# Patient Record
Sex: Female | Born: 1935 | Race: White | Hispanic: No | Marital: Married | Smoking: Never smoker
Health system: Southern US, Community
[De-identification: ages and names within clinical notes are randomized; demographics above are authoritative.]

## PROBLEM LIST (undated history)

## (undated) DIAGNOSIS — T68XXXA Hypothermia, initial encounter: Secondary | ICD-10-CM

## (undated) DIAGNOSIS — I272 Pulmonary hypertension, unspecified: Secondary | ICD-10-CM

## (undated) DIAGNOSIS — R7881 Bacteremia: Secondary | ICD-10-CM

## (undated) DIAGNOSIS — I5030 Unspecified diastolic (congestive) heart failure: Secondary | ICD-10-CM

## (undated) DIAGNOSIS — I10 Essential (primary) hypertension: Secondary | ICD-10-CM

## (undated) DIAGNOSIS — K589 Irritable bowel syndrome without diarrhea: Secondary | ICD-10-CM

## (undated) DIAGNOSIS — I509 Heart failure, unspecified: Secondary | ICD-10-CM

## (undated) DIAGNOSIS — D649 Anemia, unspecified: Secondary | ICD-10-CM

## (undated) DIAGNOSIS — N189 Chronic kidney disease, unspecified: Secondary | ICD-10-CM

## (undated) DIAGNOSIS — E785 Hyperlipidemia, unspecified: Secondary | ICD-10-CM

## (undated) DIAGNOSIS — I6529 Occlusion and stenosis of unspecified carotid artery: Secondary | ICD-10-CM

## (undated) DIAGNOSIS — K5790 Diverticulosis of intestine, part unspecified, without perforation or abscess without bleeding: Secondary | ICD-10-CM

## (undated) DIAGNOSIS — B952 Enterococcus as the cause of diseases classified elsewhere: Secondary | ICD-10-CM

## (undated) DIAGNOSIS — I2510 Atherosclerotic heart disease of native coronary artery without angina pectoris: Secondary | ICD-10-CM

## (undated) DIAGNOSIS — I701 Atherosclerosis of renal artery: Secondary | ICD-10-CM

## (undated) DIAGNOSIS — I4891 Unspecified atrial fibrillation: Secondary | ICD-10-CM

## (undated) HISTORY — PX: CORONARY ANGIOPLASTY WITH STENT PLACEMENT: SHX49

## (undated) HISTORY — DX: Hyperlipidemia, unspecified: E78.5

## (undated) HISTORY — PX: ABDOMINAL HYSTERECTOMY: SHX81

## (undated) HISTORY — DX: Anemia, unspecified: D64.9

## (undated) HISTORY — DX: Occlusion and stenosis of unspecified carotid artery: I65.29

## (undated) HISTORY — DX: Unspecified atrial fibrillation: I48.91

## (undated) HISTORY — DX: Pulmonary hypertension, unspecified: I27.20

## (undated) HISTORY — DX: Bacteremia: R78.81

## (undated) HISTORY — DX: Hypothermia, initial encounter: T68.XXXA

## (undated) HISTORY — DX: Irritable bowel syndrome, unspecified: K58.9

## (undated) HISTORY — DX: Diverticulosis of intestine, part unspecified, without perforation or abscess without bleeding: K57.90

## (undated) HISTORY — DX: Enterococcus as the cause of diseases classified elsewhere: B95.2

## (undated) HISTORY — PX: OVARIAN CYST REMOVAL: SHX89

## (undated) HISTORY — DX: Essential (primary) hypertension: I10

## (undated) HISTORY — DX: Atherosclerosis of renal artery: I70.1

## (undated) HISTORY — DX: Chronic kidney disease, unspecified: N18.9

## (undated) HISTORY — DX: Unspecified diastolic (congestive) heart failure: I50.30

## (undated) HISTORY — DX: Atherosclerotic heart disease of native coronary artery without angina pectoris: I25.10

---

## 1993-08-26 HISTORY — PX: TOTAL HIP ARTHROPLASTY: SHX124

## 1996-08-26 HISTORY — PX: CORONARY ARTERY BYPASS GRAFT: SHX141

## 1999-09-05 ENCOUNTER — Inpatient Hospital Stay (HOSPITAL_COMMUNITY): Admission: EM | Admit: 1999-09-05 | Discharge: 1999-09-09 | Payer: Self-pay | Admitting: Cardiology

## 1999-09-06 ENCOUNTER — Encounter: Payer: Self-pay | Admitting: Cardiology

## 1999-09-11 ENCOUNTER — Inpatient Hospital Stay (HOSPITAL_COMMUNITY): Admission: EM | Admit: 1999-09-11 | Discharge: 1999-09-13 | Payer: Self-pay | Admitting: Cardiology

## 1999-09-12 ENCOUNTER — Encounter: Payer: Self-pay | Admitting: Cardiology

## 2000-04-10 ENCOUNTER — Encounter: Payer: Self-pay | Admitting: Cardiology

## 2000-04-10 ENCOUNTER — Ambulatory Visit (HOSPITAL_COMMUNITY): Admission: RE | Admit: 2000-04-10 | Discharge: 2000-04-11 | Payer: Self-pay | Admitting: Cardiology

## 2000-12-25 ENCOUNTER — Ambulatory Visit (HOSPITAL_COMMUNITY): Admission: RE | Admit: 2000-12-25 | Discharge: 2000-12-26 | Payer: Self-pay | Admitting: Cardiology

## 2003-02-10 ENCOUNTER — Inpatient Hospital Stay (HOSPITAL_COMMUNITY): Admission: EM | Admit: 2003-02-10 | Discharge: 2003-02-22 | Payer: Self-pay | Admitting: Internal Medicine

## 2003-02-11 ENCOUNTER — Encounter: Payer: Self-pay | Admitting: Cardiology

## 2003-02-16 ENCOUNTER — Encounter: Payer: Self-pay | Admitting: Internal Medicine

## 2003-05-03 ENCOUNTER — Ambulatory Visit (HOSPITAL_COMMUNITY): Admission: RE | Admit: 2003-05-03 | Discharge: 2003-05-03 | Payer: Self-pay | Admitting: Cardiology

## 2003-06-27 HISTORY — PX: RENAL ARTERY STENT: SHX2321

## 2003-06-28 ENCOUNTER — Ambulatory Visit (HOSPITAL_COMMUNITY): Admission: RE | Admit: 2003-06-28 | Discharge: 2003-06-29 | Payer: Self-pay | Admitting: Cardiology

## 2003-12-19 ENCOUNTER — Encounter: Admission: RE | Admit: 2003-12-19 | Discharge: 2003-12-19 | Payer: Self-pay | Admitting: Cardiology

## 2004-06-05 ENCOUNTER — Encounter: Payer: Self-pay | Admitting: Internal Medicine

## 2004-06-26 ENCOUNTER — Encounter: Payer: Self-pay | Admitting: Internal Medicine

## 2004-07-26 ENCOUNTER — Encounter: Payer: Self-pay | Admitting: Internal Medicine

## 2004-08-26 ENCOUNTER — Encounter: Payer: Self-pay | Admitting: Internal Medicine

## 2004-09-26 ENCOUNTER — Emergency Department: Payer: Self-pay | Admitting: Emergency Medicine

## 2004-10-01 ENCOUNTER — Ambulatory Visit: Payer: Self-pay | Admitting: Internal Medicine

## 2004-10-09 ENCOUNTER — Ambulatory Visit: Payer: Self-pay | Admitting: Nurse Practitioner

## 2004-10-22 ENCOUNTER — Ambulatory Visit: Payer: Self-pay | Admitting: Internal Medicine

## 2005-01-22 ENCOUNTER — Inpatient Hospital Stay: Payer: Self-pay | Admitting: Internal Medicine

## 2005-02-13 ENCOUNTER — Ambulatory Visit: Payer: Self-pay | Admitting: Gynecologic Oncology

## 2005-03-25 ENCOUNTER — Inpatient Hospital Stay: Payer: Self-pay | Admitting: Internal Medicine

## 2005-04-24 ENCOUNTER — Ambulatory Visit: Payer: Self-pay | Admitting: Gynecologic Oncology

## 2007-05-13 ENCOUNTER — Emergency Department: Payer: Self-pay | Admitting: Emergency Medicine

## 2007-06-11 ENCOUNTER — Emergency Department: Payer: Self-pay | Admitting: Emergency Medicine

## 2007-06-19 ENCOUNTER — Other Ambulatory Visit: Payer: Self-pay

## 2007-06-19 ENCOUNTER — Inpatient Hospital Stay: Payer: Self-pay | Admitting: Unknown Physician Specialty

## 2007-06-20 ENCOUNTER — Other Ambulatory Visit: Payer: Self-pay

## 2008-01-10 ENCOUNTER — Ambulatory Visit: Payer: Self-pay | Admitting: Internal Medicine

## 2008-01-10 ENCOUNTER — Emergency Department: Payer: Self-pay | Admitting: Emergency Medicine

## 2008-01-10 ENCOUNTER — Ambulatory Visit: Payer: Self-pay | Admitting: Pulmonary Disease

## 2008-01-10 ENCOUNTER — Inpatient Hospital Stay (HOSPITAL_COMMUNITY): Admission: AD | Admit: 2008-01-10 | Discharge: 2008-02-01 | Payer: Self-pay

## 2008-01-11 ENCOUNTER — Encounter (INDEPENDENT_AMBULATORY_CARE_PROVIDER_SITE_OTHER): Payer: Self-pay | Admitting: Internal Medicine

## 2008-01-19 ENCOUNTER — Encounter: Payer: Self-pay | Admitting: Cardiology

## 2008-01-25 ENCOUNTER — Ambulatory Visit: Payer: Self-pay | Admitting: *Deleted

## 2008-01-25 ENCOUNTER — Encounter: Payer: Self-pay | Admitting: Cardiology

## 2008-02-11 ENCOUNTER — Ambulatory Visit: Payer: Self-pay | Admitting: Internal Medicine

## 2008-02-11 LAB — CONVERTED CEMR LAB
BUN: 43 mg/dL — ABNORMAL HIGH (ref 6–23)
Calcium: 9.6 mg/dL (ref 8.4–10.5)
Creatinine, Ser: 1.39 mg/dL — ABNORMAL HIGH (ref 0.40–1.20)
Glucose, Bld: 110 mg/dL — ABNORMAL HIGH (ref 70–99)
HCT: 38.3 % (ref 36.0–46.0)
Hemoglobin: 11.7 g/dL — ABNORMAL LOW (ref 12.0–15.0)
MCHC: 30.5 g/dL (ref 30.0–36.0)
MCV: 94.1 fL (ref 78.0–100.0)
Pro B Natriuretic peptide (BNP): 505 pg/mL — ABNORMAL HIGH (ref 0.0–100.0)
RDW: 20.1 % — ABNORMAL HIGH (ref 11.5–15.5)
Sodium: 137 meq/L (ref 135–145)

## 2008-03-02 ENCOUNTER — Ambulatory Visit (HOSPITAL_COMMUNITY): Admission: RE | Admit: 2008-03-02 | Discharge: 2008-03-02 | Payer: Self-pay | Admitting: Nephrology

## 2008-05-05 ENCOUNTER — Ambulatory Visit: Payer: Self-pay | Admitting: Internal Medicine

## 2008-06-29 ENCOUNTER — Ambulatory Visit: Payer: Self-pay

## 2008-06-29 ENCOUNTER — Encounter: Payer: Self-pay | Admitting: Internal Medicine

## 2008-09-09 ENCOUNTER — Ambulatory Visit: Payer: Self-pay | Admitting: Internal Medicine

## 2008-09-21 ENCOUNTER — Ambulatory Visit: Payer: Self-pay

## 2008-11-14 ENCOUNTER — Ambulatory Visit: Payer: Self-pay | Admitting: Cardiovascular Disease

## 2008-12-28 ENCOUNTER — Ambulatory Visit: Payer: Self-pay | Admitting: Internal Medicine

## 2008-12-28 ENCOUNTER — Encounter: Payer: Self-pay | Admitting: Internal Medicine

## 2008-12-28 DIAGNOSIS — I6529 Occlusion and stenosis of unspecified carotid artery: Secondary | ICD-10-CM | POA: Insufficient documentation

## 2008-12-28 DIAGNOSIS — K5730 Diverticulosis of large intestine without perforation or abscess without bleeding: Secondary | ICD-10-CM | POA: Insufficient documentation

## 2008-12-28 DIAGNOSIS — I25810 Atherosclerosis of coronary artery bypass graft(s) without angina pectoris: Secondary | ICD-10-CM

## 2008-12-28 DIAGNOSIS — E785 Hyperlipidemia, unspecified: Secondary | ICD-10-CM

## 2008-12-28 DIAGNOSIS — K589 Irritable bowel syndrome without diarrhea: Secondary | ICD-10-CM | POA: Insufficient documentation

## 2008-12-28 DIAGNOSIS — I4891 Unspecified atrial fibrillation: Secondary | ICD-10-CM

## 2008-12-28 DIAGNOSIS — I5030 Unspecified diastolic (congestive) heart failure: Secondary | ICD-10-CM | POA: Insufficient documentation

## 2008-12-28 DIAGNOSIS — I10 Essential (primary) hypertension: Secondary | ICD-10-CM | POA: Insufficient documentation

## 2008-12-28 DIAGNOSIS — D649 Anemia, unspecified: Secondary | ICD-10-CM | POA: Insufficient documentation

## 2008-12-28 DIAGNOSIS — N189 Chronic kidney disease, unspecified: Secondary | ICD-10-CM | POA: Insufficient documentation

## 2009-01-17 ENCOUNTER — Ambulatory Visit: Payer: Self-pay

## 2009-05-18 ENCOUNTER — Encounter: Payer: Self-pay | Admitting: Internal Medicine

## 2009-06-02 IMAGING — CR LEFT WRIST - COMPLETE 3+ VIEW
1 series · 4 of 4 positions shown · non-contrast
Comparison: none

REASON FOR EXAM: Injury
COMMENTS:

[Series 1: view not recorded · 0.17mm/px · 4 of 4 slices shown]
[im 1/4]
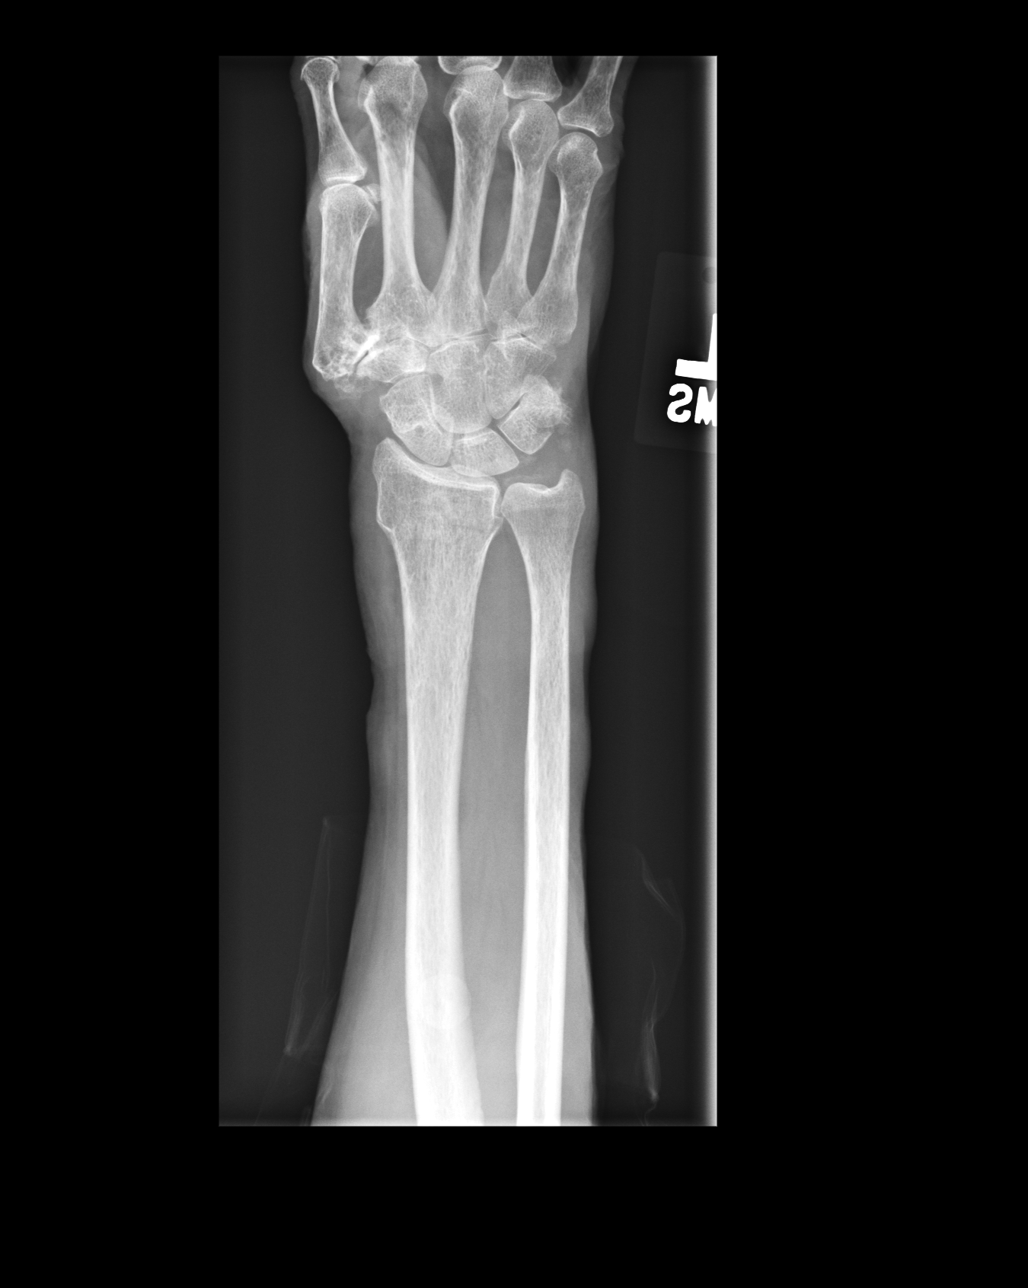
[im 2/4]
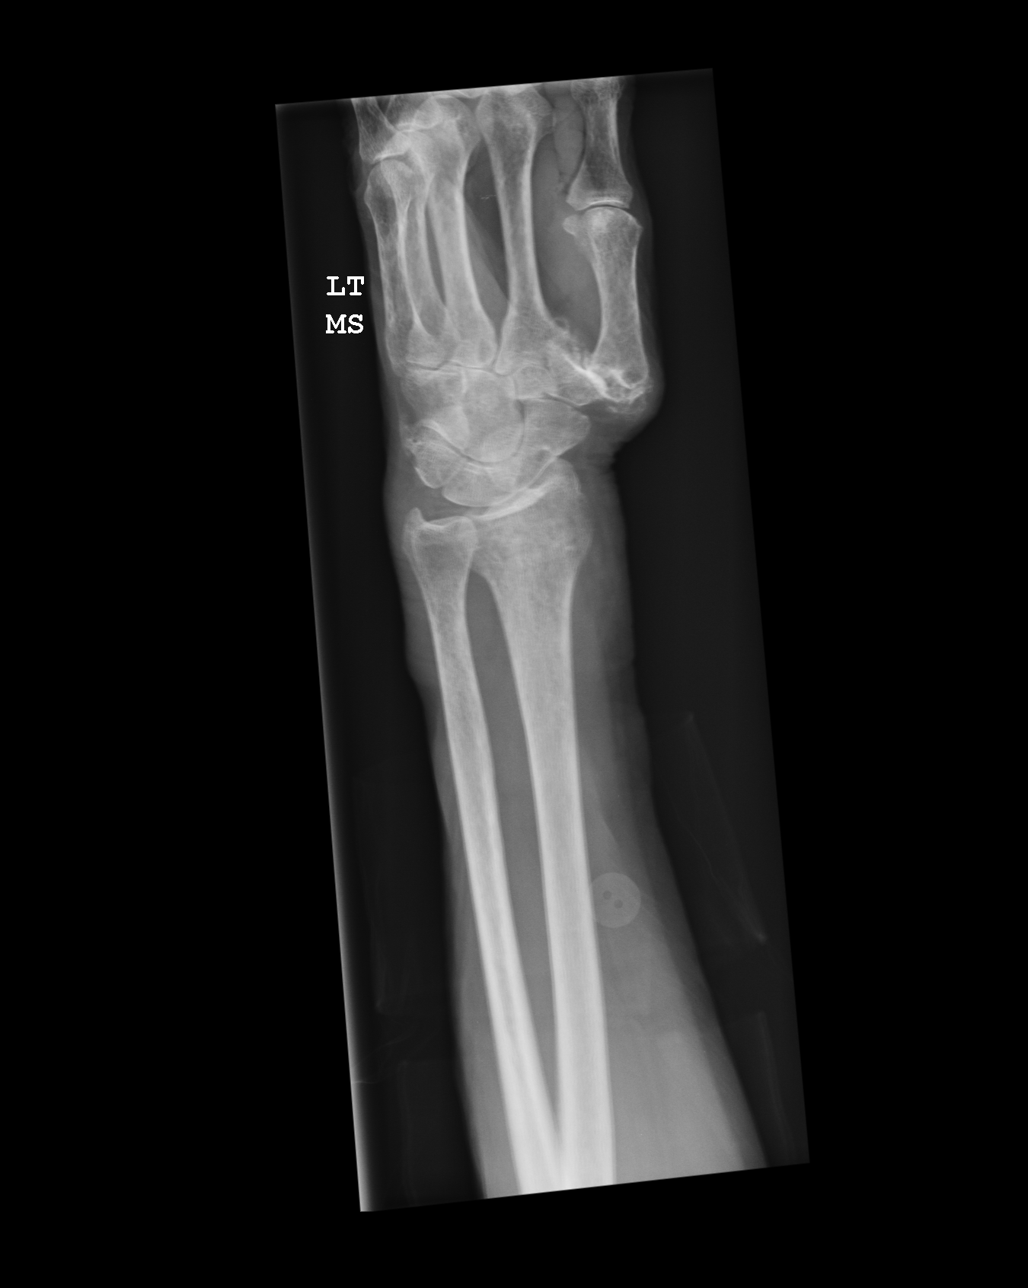
[im 3/4]
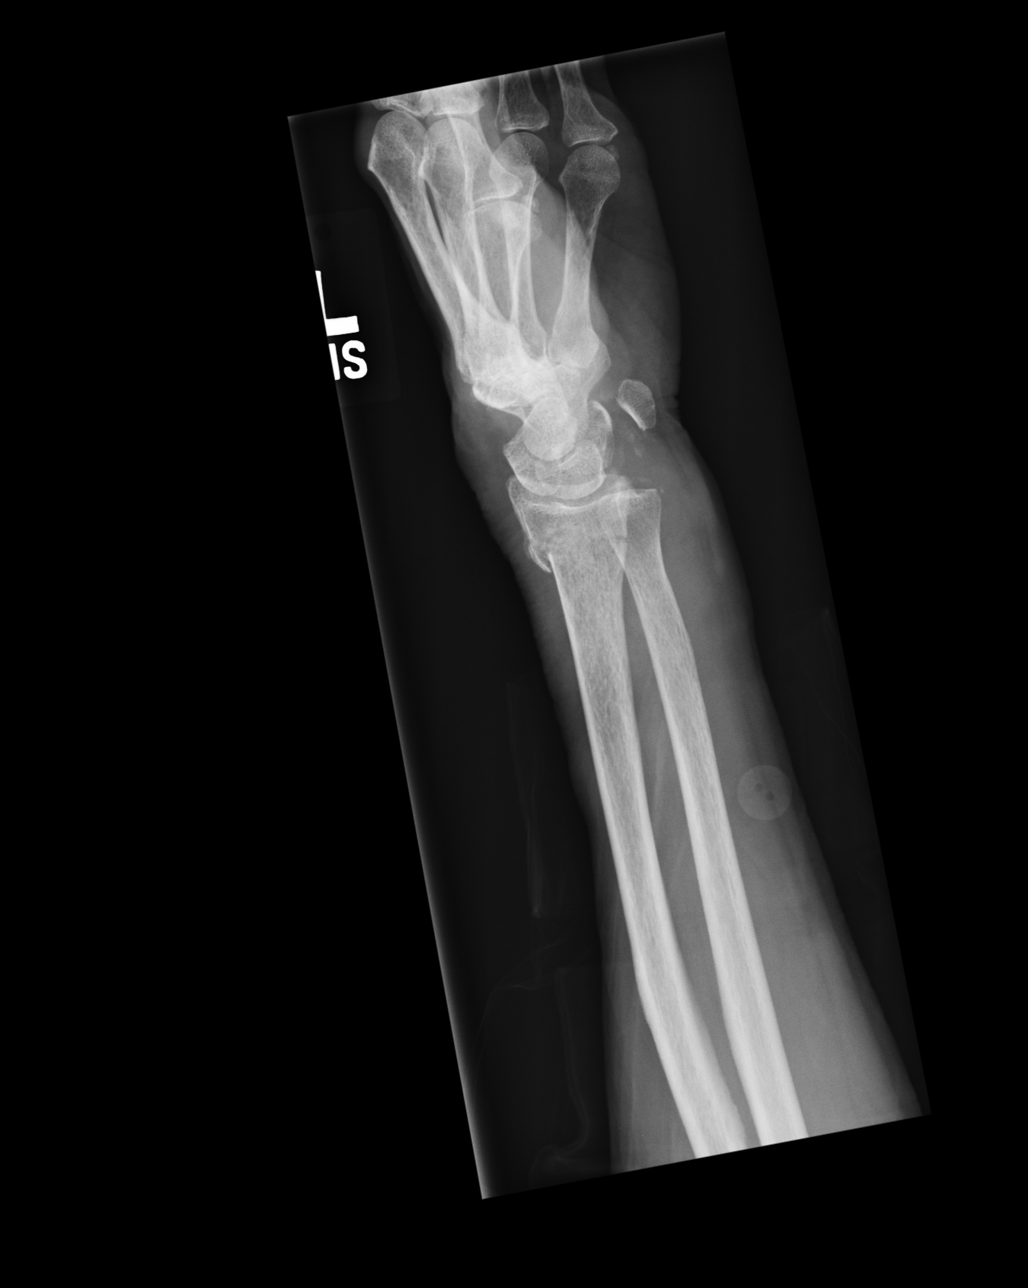
[im 4/4]
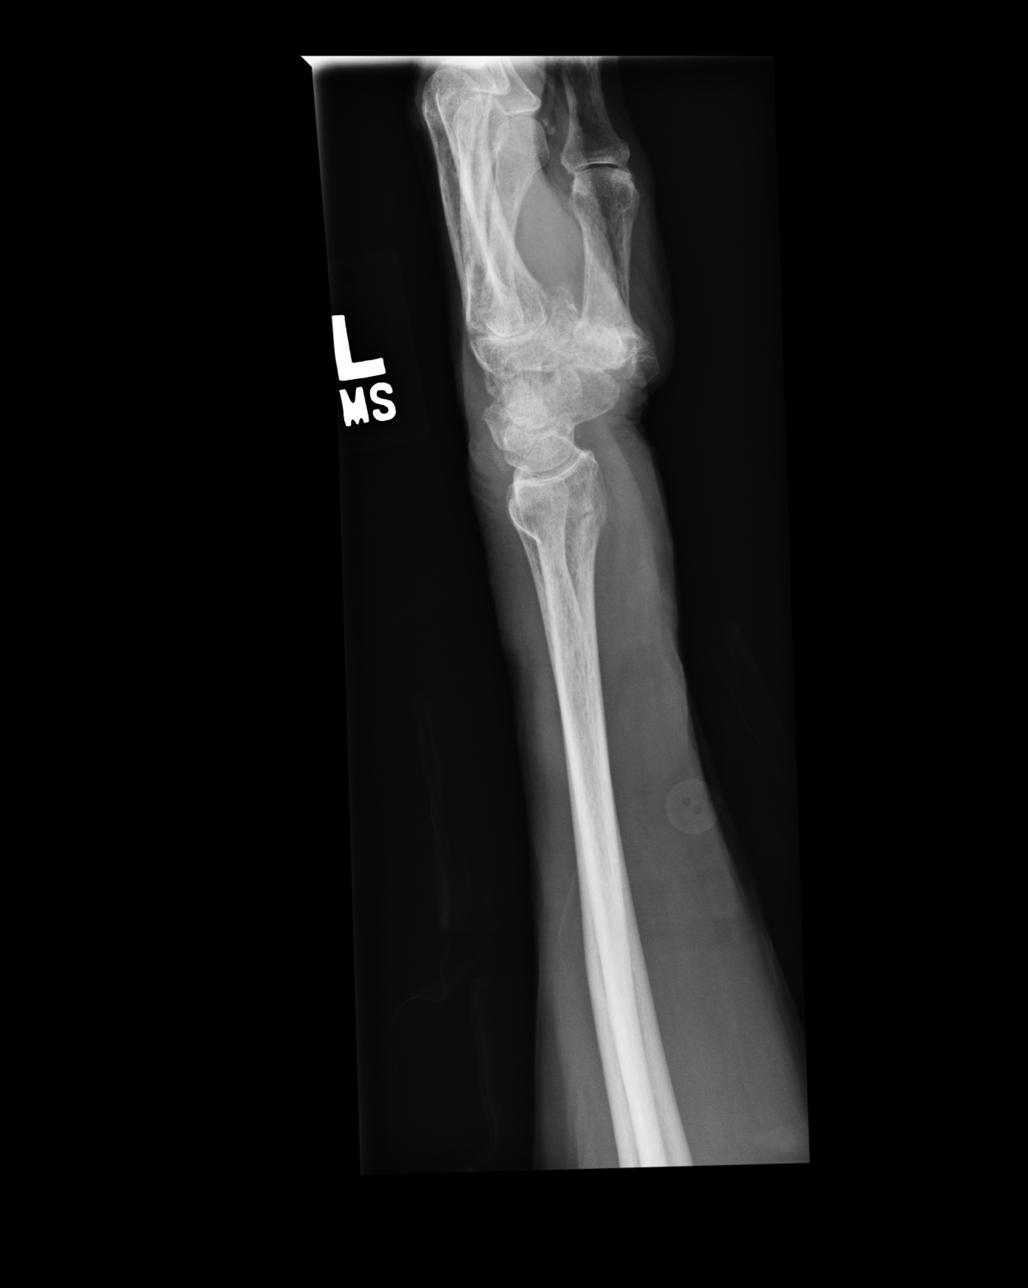

[4 of 4 positions shown; findings below may reference images not displayed]

PROCEDURE:     DXR - DXR WRIST LT COMP WITH OBLIQUES  - June 11, 2007  [DATE]

RESULT:     The patient has sustained an impacted but comminuted fracture of
the distal LEFT radial metaphysis. The distal ulna appears intact. There is
a small amount of angulation at the radial fracture site. There is
degenerative change of the carpometacarpal joints especially the first
joint. The carpal bones are grossly intact. There is diffuse osteopenia.
IMPRESSION: The patient has sustained a comminuted, impacted fracture
of the distal LEFT radial metaphysis.

## 2009-06-08 ENCOUNTER — Telehealth: Payer: Self-pay | Admitting: Internal Medicine

## 2009-06-08 ENCOUNTER — Ambulatory Visit: Payer: Self-pay

## 2009-06-08 ENCOUNTER — Encounter: Payer: Self-pay | Admitting: Cardiovascular Disease

## 2009-06-30 ENCOUNTER — Ambulatory Visit (HOSPITAL_COMMUNITY): Admission: RE | Admit: 2009-06-30 | Discharge: 2009-06-30 | Payer: Self-pay | Admitting: Nephrology

## 2009-07-03 ENCOUNTER — Telehealth: Payer: Self-pay | Admitting: Internal Medicine

## 2009-07-25 ENCOUNTER — Encounter: Payer: Self-pay | Admitting: Internal Medicine

## 2009-07-25 ENCOUNTER — Ambulatory Visit: Payer: Self-pay

## 2009-07-26 ENCOUNTER — Ambulatory Visit: Payer: Self-pay | Admitting: Internal Medicine

## 2009-07-31 ENCOUNTER — Ambulatory Visit: Payer: Self-pay | Admitting: Cardiovascular Disease

## 2009-09-04 ENCOUNTER — Ambulatory Visit (HOSPITAL_COMMUNITY): Admission: RE | Admit: 2009-09-04 | Discharge: 2009-09-04 | Payer: Self-pay | Admitting: Nephrology

## 2009-09-25 ENCOUNTER — Encounter: Payer: Self-pay | Admitting: Internal Medicine

## 2009-09-26 ENCOUNTER — Ambulatory Visit: Payer: BLUE CROSS/BLUE SHIELD | Admitting: Internal Medicine

## 2009-09-29 ENCOUNTER — Ambulatory Visit: Payer: BLUE CROSS/BLUE SHIELD | Admitting: Internal Medicine

## 2009-10-24 ENCOUNTER — Ambulatory Visit: Payer: BLUE CROSS/BLUE SHIELD | Admitting: Internal Medicine

## 2009-11-24 ENCOUNTER — Ambulatory Visit: Payer: BLUE CROSS/BLUE SHIELD | Admitting: Internal Medicine

## 2009-12-04 ENCOUNTER — Telehealth: Payer: Self-pay | Admitting: Internal Medicine

## 2009-12-18 ENCOUNTER — Encounter: Payer: Self-pay | Admitting: Cardiovascular Disease

## 2009-12-19 ENCOUNTER — Encounter: Payer: Self-pay | Admitting: Cardiovascular Disease

## 2009-12-19 ENCOUNTER — Telehealth: Payer: Self-pay | Admitting: Cardiovascular Disease

## 2009-12-19 ENCOUNTER — Ambulatory Visit: Payer: Self-pay

## 2009-12-19 DIAGNOSIS — R300 Dysuria: Secondary | ICD-10-CM | POA: Insufficient documentation

## 2009-12-20 LAB — CONVERTED CEMR LAB
Hemoglobin, Urine: NEGATIVE
Ketones, ur: NEGATIVE mg/dL
Nitrite: NEGATIVE
Urobilinogen, UA: 0.2 (ref 0.0–1.0)

## 2009-12-24 ENCOUNTER — Ambulatory Visit: Payer: BLUE CROSS/BLUE SHIELD | Admitting: Internal Medicine

## 2010-01-18 ENCOUNTER — Encounter: Payer: Self-pay | Admitting: Internal Medicine

## 2010-01-24 ENCOUNTER — Ambulatory Visit: Payer: BLUE CROSS/BLUE SHIELD | Admitting: Internal Medicine

## 2010-01-29 ENCOUNTER — Encounter: Payer: Self-pay | Admitting: Internal Medicine

## 2010-01-30 ENCOUNTER — Ambulatory Visit: Payer: Self-pay

## 2010-01-30 ENCOUNTER — Encounter: Payer: Self-pay | Admitting: Internal Medicine

## 2010-02-07 ENCOUNTER — Ambulatory Visit: Payer: Self-pay | Admitting: Internal Medicine

## 2010-02-23 ENCOUNTER — Ambulatory Visit: Payer: BLUE CROSS/BLUE SHIELD | Admitting: Internal Medicine

## 2010-03-14 ENCOUNTER — Ambulatory Visit: Payer: BLUE CROSS/BLUE SHIELD | Admitting: Internal Medicine

## 2010-03-26 ENCOUNTER — Ambulatory Visit: Payer: BLUE CROSS/BLUE SHIELD | Admitting: Internal Medicine

## 2010-05-18 ENCOUNTER — Observation Stay: Payer: BLUE CROSS/BLUE SHIELD | Admitting: Internal Medicine

## 2010-06-07 ENCOUNTER — Encounter: Payer: Self-pay | Admitting: Internal Medicine

## 2010-07-24 ENCOUNTER — Ambulatory Visit: Payer: BLUE CROSS/BLUE SHIELD | Admitting: Internal Medicine

## 2010-07-26 ENCOUNTER — Ambulatory Visit: Payer: BLUE CROSS/BLUE SHIELD | Admitting: Internal Medicine

## 2010-08-22 ENCOUNTER — Ambulatory Visit: Payer: Self-pay | Admitting: Internal Medicine

## 2010-09-24 ENCOUNTER — Ambulatory Visit: Payer: BLUE CROSS/BLUE SHIELD | Admitting: Internal Medicine

## 2010-09-25 NOTE — Assessment & Plan Note (Signed)
Summary: F6M/AMD   Visit Type:  6 mo f/u Referring Provider:  Lesle Reek Primary Provider:  Bethann Punches  CC:  no cardiac complaints today.  History of Present Illness: [name] is a delightful 77 year old white female  with multiple medical problems including coronary artery disease status post bypass surgery in 1998 with several subsequent coronary interventions. Most recently, she had angioplasty and stenting of the RCA with 2 bare-metal stents in June 2009.  She also has a history of chronic atrial fibrillation, diastolic heart failure, anemia, renal insufficiency, and renal artery stenosis status post right renal artery stenting.  In April I referred her to Dr. Clifton James to re-evalaute her renal artery stenosis after renal vascular u/s suggested progressive renal artery stenosis and some increasing atrophy of kidney. They discussed possible angiography but she was quite hesitant given previous problems. She discussed with Dr. Darrick Penna who also felt that she should defer angio given the risks and the fact that renal fx was stable. She had repeat renal u/s in may which showed stable > 60 bilat renal artery stenosis with small but stable kidneys. She sas Dr. Darrick Penna back who once again suggested avoiding renal arteriography.   She returns today for f/u. Doing well from cardiac standpoint. No CP or SOB. No swelling. Compliant will all meds. Gets around with walker, main complain is severe arthritis pain in hands, back and neck. Burises extensively with coumadin but no bleeding. No palpitations, syncope or falls.    Current Medications (verified): 1)  Lotensin 10 Mg Tabs (Benazepril Hcl) .Marland Kitchen.. 1 Tab Two Times A Day 2)  Aspirin 81 Mg Tbec (Aspirin) .... Take One Tablet By Mouth Daily 3)  Coumadin 5 Mg Tabs (Warfarin Sodium) .... Take As Directed By Cvrr 4)  Tramadol Hcl 50 Mg Tabs (Tramadol Hcl) .Marland Kitchen.. 1-2 Tab Three Times A Day 5)  Hydralazine Hcl 50 Mg Tabs (Hydralazine Hcl) .Marland Kitchen.. 1 Tab  Three Times A Day 6)  Toprol Xl 50 Mg Xr24h-Tab (Metoprolol Succinate) .... 1/2 Tab Every Am..1/2 Tab At Bedtime 7)  Vitamin D 1000 Unit Tabs (Cholecalciferol) .Marland Kitchen.. 1 By Mouth Once Daily 8)  Torsemide 20 Mg Tabs (Torsemide) .... 1/4 Tab Once Daily 9)  Nitroglycerin 0.4 Mg Subl (Nitroglycerin) .... One Tablet Under Tongue Every 5 Minutes As Needed For Chest Pain---May Repeat Times Three 10)  Xanax 0.25 Mg Tabs (Alprazolam) .Marland Kitchen.. 1-2 Tab in The Afternoon...2 Tabs At Bedtime. 11)  Centrum Silver  Tabs (Multiple Vitamins-Minerals) .Marland Kitchen.. 1 By Mouth Once Daily  Allergies: 1)  Codeine Phosphate (Codeine Phosphate) 2)  Penicillin V Potassium (Penicillin V Potassium)  Past History:  Past Medical History: CAD, ARTERY BYPASS GRAFT (ICD-414.04).Marland Kitchen status post CABG in 1988 and most recently with 2 bare-metal stents placed in the right coronary artery in June 2009.   ATRIAL FIBRILLATION (ICD-427.31) Carotis stenosis    --2004: 40-59 R 0-39% L    --June 2010 L 60-79% R 40-59% UNSPECIFIED DIASTOLIC HEART FAILURE (ICD-428.30) HYPERTENSION, UNSPECIFIED (ICD-401.9) HYPERLIPIDEMIA-MIXED (ICD-272.4) HYPERTENSION, PULMONARY (MODERATE TO SEVERE) (ICD-416.8) RENAL ARTERY STENOSIS (BILATERAL) (ICD-440.1).Marland Kitchenstatus post placement of a drug-eluting stent in the right renal artery in November 2004. s/p PTCA L    --renal u/s 10/10: >60% ostial stenosis B. both kidneys small. r decreasing in size    --renal u/s 5/11  > 60% B renal artery stenosis (Stable stenosis and kidney size)  RENAL INSUFFICIENCY, CHRONIC (ICD-585.9) DIVERTICULAR DISEASE (ICD-562.10) IRRITABLE BOWEL SYNDROME (ICD-564.1) ANEMIA (ICD-285.9) Endometriosis    Review of Systems  As per HPI and past medical history; otherwise all systems negative.   Vital Signs:  Patient profile:   77 year old female Height:      64 inches Weight:      127 pounds BMI:     21.88 Pulse rate:   63 / minute Pulse rhythm:   irregular BP sitting:   124 / 60   (left arm) Cuff size:   regular  Vitals Entered By: Danielle Rankin, CMA (February 07, 2010 12:12 PM)  Physical Exam  General:  Gen: Frail elderly female who walks with a walker. No acute distress. Pleasant.  HEENT: normal Neck: supple. no JVD. Carotids 2+ bilat; with bilat bruits L>R. No lymphadenopathy or thryomegaly appreciated. Cor: PMI nondisplaced. Irregular rate & rhythm. No rubs, gallops. 2/6 systolic murmur RSB. Lungs: clear bilaterally Abdomen: soft, nontender, nondistended. No hepatosplenomegaly. No bruits or masses. Good bowel sounds. Extremities: no cyanosis, clubbing, rash, edema Neuro: alert & orientedx3. Non-focal. Moves all extremities equally.    Impression & Recommendations:  Problem # 1:  CAD, ARTERY BYPASS GRAFT (ICD-414.04) Stable. No evidence of ischemia. Continue current regimen.  Problem # 2:  CAROTID STENOSIS (ICD-433.10) Asymptomatic. Will need f/u in 6 months.  Problem # 3:  ATRIAL FIBRILLATION (ICD-427.31) Stable. Continue coumadin.  Problem # 4:  HYPERTENSION, UNSPECIFIED (ICD-401.9) Blood pressure well controlled. Continue current regimen.  Problem # 5:  RENAL ARTERY STENOSIS (BILATERAL) (ICD-440.1) As long as kidney function and size are stable, I agree with watchful waiting approach. Appreciate input form Drs. Mcalhany and Deterding.   Other Orders: EKG w/ Interpretation (93000)  Patient Instructions: 1)  Your physician has requested that you have a carotid duplex. This test is an ultrasound of the carotid arteries in your neck. It looks at blood flow through these arteries that supply the brain with blood. Allow one hour for this exam. There are no restrictions or special instructions.  NEEDS IN 6 MONTHS. 2)  Your physician wants you to follow-up in:  6 MONTHS.  You will receive a reminder letter in the mail two months in advance. If you don't receive a letter, please call our office to schedule the follow-up appointment.  Prevention & Chronic  Care Immunizations   Influenza vaccine: Not documented    Tetanus booster: Not documented    Pneumococcal vaccine: Not documented    H. zoster vaccine: Not documented  Colorectal Screening   Hemoccult: Not documented    Colonoscopy: Not documented  Other Screening   Pap smear: Not documented    Mammogram: Not documented    DXA bone density scan: Not documented   Smoking status: never  (12/28/2008)  Lipids   Total Cholesterol: Not documented   LDL: Not documented   LDL Direct: Not documented   HDL: Not documented   Triglycerides: Not documented    SGOT (AST): Not documented   SGPT (ALT): Not documented   Alkaline phosphatase: Not documented   Total bilirubin: Not documented  Hypertension   Last Blood Pressure: 124 / 60  (02/07/2010)   Serum creatinine: 1.39  (02/11/2008)   Serum potassium 4.2  (02/11/2008)  Self-Management Support :    Hypertension self-management support: Not documented    Lipid self-management support: Not documented

## 2010-09-25 NOTE — Letter (Signed)
Summary: [facility] Patient Note   [facility] Patient Note   Imported By: Roderic Ovens 08/02/2010 14:16:31  _____________________________________________________________________  External Attachment:    Type:   Image     Comment:   External Document

## 2010-09-25 NOTE — Consult Note (Signed)
Summary: [facility]  [facility]   Imported By: Marylou Mccoy 12/01/2009 11:35:41  _____________________________________________________________________  External Attachment:    Type:   Image     Comment:   External Document

## 2010-09-25 NOTE — Letter (Signed)
Summary: [facility] Patient Note   [facility] Patient Note   Imported By: Roderic Ovens 02/20/2010 15:30:45  _____________________________________________________________________  External Attachment:    Type:   Image     Comment:   External Document

## 2010-09-25 NOTE — Miscellaneous (Signed)
Summary: Orders Update  Clinical Lists Changes  Orders: Added new Test order of Carotid Duplex (Carotid Duplex) - Signed 

## 2010-09-25 NOTE — Miscellaneous (Signed)
Summary: Orders Update  Clinical Lists Changes  Orders: Added new Test order of Renal Artery Duplex (Renal Artery Duplex) - Signed 

## 2010-09-25 NOTE — Progress Notes (Signed)
Summary: ROOT CANAL  Phone Note From Other Clinic Call back at [phone]   Caller: [name] Call For: [name] Summary of Call: IS PT OKAY TO HAVE A ROOT CANAL Initial call taken by: Harlon Flor,  December 04, 2009 4:39 PM  Follow-up for Phone Call        ok to have root canal.  pt states that she already had procedure done.   Follow-up by: Charlena Cross, RN, BSN,  December 06, 2009 12:56 PM

## 2010-09-25 NOTE — Consult Note (Signed)
Summary: [facility]   Imported By: Harlon Flor 02/02/2010 11:54:49  _____________________________________________________________________  External Attachment:    Type:   Image     Comment:   External Document

## 2010-09-25 NOTE — Progress Notes (Signed)
Summary: painful urination  Phone Note Call from Patient   Summary of Call: pt in to office for echo.  states that she is having frequency and burning with urination.  sample obtained.  will forward results to PCP.  Initial call taken by: Charlena Cross, RN, BSN,  December 19, 2009 3:55 PM  New Problems: DYSURIA (ICD-788.1)   New Problems: DYSURIA (ICD-788.1)

## 2010-10-15 ENCOUNTER — Encounter: Payer: Self-pay | Admitting: Internal Medicine

## 2010-10-18 ENCOUNTER — Encounter: Payer: Self-pay | Admitting: Internal Medicine

## 2010-10-18 ENCOUNTER — Ambulatory Visit: Payer: Self-pay | Admitting: Internal Medicine

## 2010-10-23 NOTE — Miscellaneous (Signed)
Summary: Orders Update  Clinical Lists Changes  Orders: Added new Test order of Carotid Duplex (Carotid Duplex) - Signed 

## 2010-11-13 ENCOUNTER — Encounter (INDEPENDENT_AMBULATORY_CARE_PROVIDER_SITE_OTHER): Payer: Medicare Other | Admitting: *Deleted

## 2010-11-13 ENCOUNTER — Encounter: Payer: Self-pay | Admitting: Cardiovascular Disease

## 2010-11-13 ENCOUNTER — Ambulatory Visit (INDEPENDENT_AMBULATORY_CARE_PROVIDER_SITE_OTHER): Payer: Medicare Other | Admitting: Cardiovascular Disease

## 2010-11-13 ENCOUNTER — Other Ambulatory Visit: Payer: Self-pay | Admitting: Internal Medicine

## 2010-11-13 VITALS — BP 100/68 | HR 61 | Ht 65.0 in | Wt 127.0 lb

## 2010-11-13 DIAGNOSIS — I739 Peripheral vascular disease, unspecified: Secondary | ICD-10-CM

## 2010-11-13 DIAGNOSIS — I6529 Occlusion and stenosis of unspecified carotid artery: Secondary | ICD-10-CM

## 2010-11-13 DIAGNOSIS — I701 Atherosclerosis of renal artery: Secondary | ICD-10-CM | POA: Insufficient documentation

## 2010-11-13 DIAGNOSIS — I10 Essential (primary) hypertension: Secondary | ICD-10-CM | POA: Insufficient documentation

## 2010-11-13 DIAGNOSIS — I25810 Atherosclerosis of coronary artery bypass graft(s) without angina pectoris: Secondary | ICD-10-CM

## 2010-11-13 DIAGNOSIS — E785 Hyperlipidemia, unspecified: Secondary | ICD-10-CM

## 2010-11-13 DIAGNOSIS — I4891 Unspecified atrial fibrillation: Secondary | ICD-10-CM

## 2010-11-13 DIAGNOSIS — I2510 Atherosclerotic heart disease of native coronary artery without angina pectoris: Secondary | ICD-10-CM

## 2010-11-13 NOTE — Patient Instructions (Signed)
Return this week for Lipid/Lft. Continue current medications. Follow up in 6 months, we will call to schedule appt.

## 2010-11-13 NOTE — Assessment & Plan Note (Signed)
Currently with no symptoms of angina. Continue medical management.

## 2010-11-13 NOTE — Assessment & Plan Note (Signed)
Currently on warfarin. Excellent: Her medication regimen.

## 2010-11-13 NOTE — Assessment & Plan Note (Signed)
We'll continue on her current blood pressure medications.

## 2010-11-13 NOTE — Progress Notes (Signed)
   Patient ID: [name], female    DOB: 11-18-1922, 77 y.o.   MRN: [id number]  HPI [name] is a delightful 77 year old white female  with multiple medical problems including coronary artery disease status post bypass surgery in 1998 with several subsequent coronary interventions. Most recently, she had angioplasty and stenting of the RCA with 2 bare-metal stents in June 2009.  She also has a history of chronic atrial fibrillation, diastolic heart failure, anemia, renal insufficiency, and renal artery stenosis status post right renal artery stenting.she presents for routine followup.  She reports that she has been doing well overall. She denies any new complaints. No significant shortness of breath, edema, lightheadedness. She is able to ambulate though her gait is unsteady and she is weak.  Gets around with walker, main complain is severe arthritis pain in hands, back and neck. Burises extensively with coumadin but no bleeding. No palpitations, syncope or falls.   She has renal artery stenosis that has been progressive with some increasing atrophy of kidney.  she was quite hesitant to have any intervention given previous problems. She discussed with Dr. Darrick Penna who also felt that she should defer angio given the risks and the fact that renal fx was stable. She had repeat renal u/s in may which showed stable > 60 bilat renal artery stenosis with small but stable kidneys. She sas Dr. Darrick Penna back who once again suggested avoiding renal arteriography.   Review of Systems  HENT: Negative.   Eyes: Negative.   Respiratory: Negative.   Cardiovascular: Negative.   Gastrointestinal: Negative.   Musculoskeletal: Positive for back pain, joint swelling, arthralgias and gait problem.  Skin: Negative.   Neurological: Positive for weakness. Negative for dizziness, light-headedness and numbness.  Hematological: Negative.   Psychiatric/Behavioral: Negative.       Physical Exam    Constitutional: She is oriented to person, place, and time.       Thin woman. Frail  HENT:  Head: Normocephalic.  Nose: Nose normal.  Mouth/Throat: Oropharynx is clear and moist.  Eyes: Conjunctivae are normal. Pupils are equal, round, and reactive to light.  Neck: Normal range of motion. Neck supple. No JVD present.  Cardiovascular: Normal rate, regular rhythm and intact distal pulses.  Exam reveals no gallop and no friction rub.   Murmur heard. Pulmonary/Chest: Effort normal and breath sounds normal. No respiratory distress. She has no wheezes. She has no rales. She exhibits no tenderness.  Abdominal: Soft. Bowel sounds are normal. She exhibits no distension. There is no tenderness.  Musculoskeletal: Normal range of motion. She exhibits no edema and no tenderness.  Lymphadenopathy:    She has no cervical adenopathy.  Neurological: She is alert and oriented to person, place, and time. Coordination normal.  Skin: Skin is warm and dry. No rash noted. No erythema.  Psychiatric: She has a normal mood and affect. Her behavior is normal. Judgment and thought content normal.

## 2010-11-13 NOTE — Assessment & Plan Note (Signed)
Previous history of renal artery stenosis. Will recommend ultrasound on an annual basis. Could intervene if needed for severe disease.

## 2010-11-13 NOTE — Assessment & Plan Note (Signed)
Recent carotid arterial ultrasound dated March 2012 shows 40-59% right carotid disease, 60-79% left carotid disease. Stable from previous ultrasound.

## 2010-11-13 NOTE — Progress Notes (Signed)
   Patient ID: [name], female    DOB: [birth year], 77 y.o.   MRN: [id number]  HPI    Review of Systems    Physical Exam   EKG on her visit November 13, 2010 shows atrial fibrillation with rate of 61 beats per minute with no significant ST or T wave changes.

## 2010-11-15 ENCOUNTER — Other Ambulatory Visit: Payer: BLUE CROSS/BLUE SHIELD | Admitting: *Deleted

## 2010-11-16 ENCOUNTER — Ambulatory Visit: Payer: BLUE CROSS/BLUE SHIELD | Admitting: Internal Medicine

## 2010-11-16 ENCOUNTER — Other Ambulatory Visit (INDEPENDENT_AMBULATORY_CARE_PROVIDER_SITE_OTHER): Payer: Medicare Other | Admitting: *Deleted

## 2010-11-16 DIAGNOSIS — E785 Hyperlipidemia, unspecified: Secondary | ICD-10-CM

## 2010-11-25 ENCOUNTER — Ambulatory Visit: Payer: BLUE CROSS/BLUE SHIELD | Admitting: Internal Medicine

## 2010-11-29 ENCOUNTER — Encounter: Payer: BLUE CROSS/BLUE SHIELD | Admitting: Internal Medicine

## 2010-12-06 ENCOUNTER — Other Ambulatory Visit: Payer: Self-pay | Admitting: Internal Medicine

## 2010-12-06 DIAGNOSIS — I6529 Occlusion and stenosis of unspecified carotid artery: Secondary | ICD-10-CM

## 2011-01-08 NOTE — Consult Note (Signed)
NAMEADREENA, [name]           ACCOUNT NO.:  [id number]   MEDICAL RECORD NO.:  [id number]          PATIENT TYPE:  INP   LOCATION:  2111                         FACILITY:  [facility]   PHYSICIAN:  Bevelyn Buckles. Bensimhon, MDDATE OF BIRTH:  12/22/1922   DATE OF CONSULTATION:  01/11/2008  DATE OF DISCHARGE:                                 CONSULTATION   PRIMARY CARE PHYSICIAN:  Bethann Punches, MD, [place].   CARDIOLOGIST:  Arnoldo Hooker, MD, [place].   REQUESTING PHYSICIAN:  Oley Balm. Simonds, MD.   REASON FOR CONSULTATION:  Non-ST-elevation myocardial infarction and  acute on chronic renal failure.   HISTORY OF PRESENT ILLNESS:  [name] is a very complicated 77-year-  old woman with multiple medical problems including coronary artery  disease status post bypass surgery in 1988 with subsequent percutaneous  interventions on the LAD in 2001 as well as PCI on the saphenous vein  graft to the OM in 2004.  She also has a history of congestive heart  failure secondary to ischemic cardiomyopathy with ejection fraction of  35-45% in 2004, chronic atrial fibrillation, renal artery stenosis  status post multiple percutaneous interventions, pulmonary hypertension,  and chronic renal insufficiency with creatinine about 1.5 in 2004.   She was previously followed by Dr. Samule Ohm in our office but more  recently followed by Dr. Gwen Pounds.   In reviewing her cardiac records, she apparently has quite a bit of  problems with right renal artery stenosis.  She underwent several  angioplasties of this but was complicated by in-stent restenosis.  She  finally underwent a drug-eluting stent to the right renal artery in  November 2004.  Followup CT angiogram in 2005 showed a patent renal  artery.  She has also had extensive problems with coronary artery  disease.  Most recent cardiac catheterization in June 2004 showed normal  left main. LAD was totaled in the mid section. Previous stents were  occluded.  The left circumflex had mild disease in the main circumflex  with totally occluded OM-2.  Right coronary artery at 40% lesion in the  mid section and 70% lesion in the ostium of PA.  The LIMA to the LAD,  which was previously atretic, was no longer atretic and had fairly  diffuse disease, however, and this was not amenable to angioplasty. The  saphenous vein graft to the OM-2 had a 90% lesion in it and was stented  with TAXUS drug-eluting stent.   Since that time, she has been in apparently in typical but frail state  of health.  She says about a month ago she noticed increasing shortness  of breath and occasional lower extremity edema with one episode of  fairly severe edema.  Last week, however, she began to develop chest  pain.  She was taken to [facility] with chest pain  and confusion on May 17, and she was found to be in acute renal failure  with a creatinine of 6.5, potassium of 6.7, with a wide complex  bradycardia secondary to her hyperkalemia.  She was treated with a  Kayexalate, bicarbonate, and dopamine for associated hypotension.  Her  renal failure has improved.  She is making good urine; however, that is  very much dependent on maintaining her blood pressure with dopamine.  She denies any further chest pain.   Followup labs have shown elevated cardiac markers with initial CK of  542, an MB of 53, a troponin of 13.  Followup markers showed a CK of  590, MB of 49, and troponin of 23.   REVIEW OF SYSTEMS:  She has a significant weakness and arthritis pain.  She has dyspnea on just mild exertion as well as lower extremity edema.  She also has arthritis pain.  Denies any fevers, chills.  No cough, no  bleeding.  No focal neurologic symptoms.  No rash.  Remainder of Review  of Systems is negative except for HPI and Problem List.   PROBLEM LIST:  1. Coronary artery disease.      a.     Status post bypass surgery in 1988 by Dr. Andrey Campanile.      b.      Status post percutaneous intervention of the left anterior       descending in 2001.      c.     Status post percutaneous intervention of the saphenous vein       graft to the obtuse marginal with a TAXUS drug-eluting stent in       2004.      d.     Last cardiac catheterization in June 2004 as per History of       Present Illness.  2. History of congestive heart failure secondary to ischemic      cardiomyopathy, ejection fraction 35-45% by echocardiogram in June      2004.  There was some suggestion of constrictive pericarditis at      that time.  3. Chronic atrial fibrillation.  4. Right renal artery stenosis status post multiple percutaneous      interventions complicated by in-stent restenosis.      a.     Most recent stenting in November 2004 with a drug-eluting       stent.  b . Follow-up CT angiogram in 2005 showed patent renal artery stent.  1. Pulmonary hypertension.  2. Chronic renal insufficiency.  Current baseline unknown. Creatinine      was 1.5 in 2004.  3. Peripheral vascular disease.  4. Hypertension.  5. Hypothyroidism.  6. Osteoarthritis.   CURRENT MEDICATIONS:  1. Dopamine.  2. Aspirin 81 mg a day.  3. Protonix 40 a day.  4. Synthroid.   ALLERGIES:  PENICILLIN.  There was initially some question of a Plavix  allergy, but that has been refuted.   SOCIAL HISTORY:  She lives in  [place] with her husband.  She is  retired.  No tobacco or alcohol use.   FAMILY HISTORY:  Father died  at age 49 from myocardial infarctions.  She has multiple siblings with coronary artery disease.   PHYSICAL EXAMINATION:  GENERAL:  She is an elderly, frail-appearing  woman. She is frequently moaning but in no acute distress.  She is alert  and oriented and is able to provide an adequate history.  VITAL SIGNS:  Blood pressure is 109/47 with a mean of 70.  She is on 5  mcg/kg per minute of dopamine.  Her heart rate is 80.  She is irregular.  She is saturating 95% on 2  liters nasal cannula.  CVP is 10.  She is  afebrile at 98.2.  HEENT/NECK:  Notable for mildly elevated neck veins. She has a triple-  lumen catheter with some bruising in the left internal jugular. She has  bilateral carotid bruits.  Upstrokes are 1+ bilaterally.  CARDIAC:  PMI is not displaced.  She is irregular with distant heart  sounds.  She has a 2/6 mitral regurgitation murmur at the apex.  No  gallops.  LUNGS:  Poor air movement throughout.  She is dull at the right base but  otherwise clear.  No wheezing.  ABDOMEN:  Soft, nontender, nondistended.  No hepatosplenomegaly, no  bruits.  No masses.  EXTREMITIES:  Warm with no cyanosis.  There is no clubbing.  She has no  significant edema.  She does have changes consistent with chronic venous  stasis.  SKIN:  No rash.  She does have some ecchymosis on her back.  NEUROLOGIC:  Alert and oriented x3.  Cranial nerves II-XII grossly  intact.  Moves all fours extremities without difficulty.  Affect is  pleasant.   EKG initially showed a wide complex bradycardia secondary to  hyperkalemia. Most recently now, she has atrial fibrillation with poor  progression across the anterior precordium.  She did initially have some  fairly significant T-wave inversions anteriorly.  These are somewhat  better.  There is a note in the chart that says these were present on  old EKG.  I did not have that EKG.  Echocardiogram shows an EF of 55-65%  with increased LV pressures.  There is at least moderate RV dysfunction.  There is mild MR. The RV systolic pressure is estimated at 67.   BUN was initially 137, now down to 117. Creatinine was 5.5, now down to  4.5. Potassium was 6.2, now down to 4.6. INR is 2.0. Initial CK is 542  with MB of 53.  Troponin 13.  CK followup was 590 with MB of 49 and  troponin of 23.   ASSESSMENT:  1. Acute on chronic renal failure in the setting of non-ST-elevation      myocardial infarction.  2. Coronary artery disease  status post coronary artery bypass      grafting.  3. History of congestive heart failure, now with normal ejection      fraction.  4. Dopamine-dependent hypotension.  5. Pulmonary hypertension.  6. History of right renal artery stenosis status post previous drug-      eluting stent November 2004.  7. Chronic atrial fibrillation.   PLAN AND DISCUSSION:  I suspect [name] renal failure is secondary  to acute tubular necrosis in the setting of non-ST-elevation MI.  Given  her renal failure and lack of symptoms or significant EKG changes, she  is not currently a candidate for cardiac catheterization.  I suspect  kidney function will get some better, but we will have to wait and see  how much better it gets.  If it does not normalize, I would favor  inpatient Myoview when more stable.  I would continue blood pressure  support with dopamine.  She is not candidate for beta blocker currently  given her hypotension.  I would strongly consider having renal see.  Once INR goes below 2, would treat with unfractionated heparin.  We will  follow with you.   Total time spent on consult was 1 hour 15 minutes.      Bevelyn Buckles. Bensimhon, MD  Electronically Signed     DRB/MEDQ  D:  01/11/2008  T:  01/11/2008  Job:  [id number]   cc:  Melbourne Abts, MD., Mortimer Fries B. Sung Amabile, MD

## 2011-01-08 NOTE — Discharge Summary (Signed)
NAMEADDILYNNE, [name]           ACCOUNT NO.:  [id number]   MEDICAL RECORD NO.:  [id number]          PATIENT TYPE:  INP   LOCATION:  4736                         FACILITY:  [facility]   PHYSICIAN:  [name] Beals. Juanda Chance, MD, FACCDATE OF BIRTH:  10-15-1922   DATE OF ADMISSION:  01/10/2008  DATE OF DISCHARGE:  02/01/2008                               DISCHARGE SUMMARY   PRIMARY CARDIOLOGIST:  Dr. Arvilla [name].   PRIMARY CARE [name]:  Dr. Bethann [name] in [place].   PRIMARY NEPHROLOGIST:  Dr. Fayrene Fearing [name].   DISCHARGE DIAGNOSIS:  Non-ST-segment elevation myocardial infarction.   SECONDARY DIAGNOSES:  1. Acute on chronic renal failure.  2. Acute on chronic diastolic congestive heart failure.  3. Chronic atrial fibrillation.  4. Anemia of chronic disease.  5. Hypotension.  6. Hyperlipidemia.  7. Hyperkalemia in the setting of acute renal failure on admission.  8. Bradycardia in the setting of hyperkalemia and renal failure on      admission.  9. Hypothyroidism on chronic Synthroid replacement.  10.Coronary artery disease, status post coronary artery bypass      grafting with bare-metal stenting on this admission.  11.Secondary hyperparathyroidism.  12.History of hypertension.  13.History of renal artery stenosis, status post multiple percutaneous      interventions with most recent involving drug-eluting stent      placement of the right renal artery on June 28, 2003.  This was      performed secondary to recurrent in-stent restenosis.  14.Hyperlipidemia.  15.History of pulmonary hypertension.  16.Osteoarthritis.   ALLERGIES:  PENICILLIN.   PROCEDURES:  A 2-D echocardiogram on Jan 19, 2008, EF 45-50%, moderate  mitral regurgitation, moderately dilated left atrium and right  ventricle.  Moderate to severe tricuspid regurgitation.  Moderately to  markedly dilated right atrium.  Small posterior pericardial effusion.  Cardiac catheterization and successful PCI and stenting  of the proximal  RCA with placement of two MiniVision bare-metal stents.   HISTORY OF PRESENT ILLNESS:  An 77 year old married Caucasian female  with prior history of CAD and peripheral vascular disease, who is status  post CABG in 1998.  She also has a history of chronic kidney disease.  She was admitted to [name] on Jan 10, 2008, with complaints  of chest discomfort and confusion.  She was noted to be acute in renal  failure with a creatinine of 6.5 and a potassium 6.7.  She was also  found to have a wide complex bradycardia in the setting of her  hyperkalemia.  She was also hypotensive.  She was treated with dopamine,  Kayexalate and bicarbonate with improvement in bradycardia and  hypotension.  She was transferred to [name] for further evaluation.   [name] COURSE:  The patient was initially admitted to the pulmonary  critical care service and maintained on dopamine and sodium bicarbonate  drips for hypotension and hyperkalemia respectively.  With hydration,  her creatinine began to improve.  Notably, she was found to have  elevated cardiac markers with a peak CK of 590, MB of 52.6 and troponin-  I of 23.0.  As a result, [name] cardiology was consulted  and  subsequently assumed care for the patient.  As she had no additional  chest discomfort nor acute findings on her ECG, it was determined that  she was not a candidate for acute cardiac catheterization.  However,  once her renal function and hemodynamic status improved, catheterization  was warranted.  Her Coumadin was placed on hold.  (She had been on this  secondary to chronic A fib and she was treated with vitamin K and fresh  frozen plasma as her INR on admission was supertherapeutic at 6.5).  As  her hypotension resolved, dopamine was discontinued and she was  initiated on low-dose beta- blocker therapy.  Her brain natriuretic  peptide was elevated greater than 3200 and, therefore, oral Demadex was  added as  well.  She was felt to be in good condition and underwent left  heart cardiac catheterization on Jan 19, 2008, revealing multivessel  disease, but without clear culprit for MI.  Of note, her vein graft to  the diagonal and distal left circumflex, as well as her LIMA to the LAD  were patent.  After further review of her films, it was felt that she  may benefit from PCI of the right coronary artery which itself had an  80% proximal stenosis.  She was rehydrated and taken back into the cath  lab on Jan 22, 2008, where she underwent successful placement of two  bare-metal stents in the proximal and ostial right coronary artery.  The  patient tolerated this procedure well and remained in the coronary  intensive care unit post procedure.  Unfortunately, she developed a  large hematoma during the sheath pull following her catheterization.  With this, she dropped her hemoglobin and hematocrit 12.0 and 35.2 down  to 9.8 and 28.5 by Jan 24, 2008.  We obtained a CT of the abdomen and  pelvis which was negative for retroperitoneal bleed.  We also obtained a  right groin ultrasound on January 25, 2008, which showed no evidence of AV  fistula or hematoma.  Because of her postprocedure complications with  drop in hemoglobin and hematocrit, we did not reinstitute Coumadin  therapy at this time, especially in light of current aspirin and Plavix  therapy following bare-metal stenting.   Following PCI, [name] has been evaluated by cardiac rehab and has  been ambulating.  We have continued to run into issues with intermittent  hypotension with pressures at times in the 80s, requiring that we reduce  her beta-blocker dosage to 25 mg daily and discontinue Lotensin usage or  ACE inhibitor usage altogether.  We did consult with Dr. Fayrene Fearing [name]  of [name], who recommended avoidance of hypotension  with subsequent discontinuation of Lotensin.  He also warned against  using atenolol more  than once a day in the setting of chronic kidney  disease.   [name] has continued to ambulate with the assistance of her husband  and overall has recovered nicely.  Withholding of ACE inhibitor therapy,  her hypotension and orthostasis has more or less resolved and her atrial  fibrillation rate control has been adequate on once a day atenolol.  We  have arranged for multiple follow-up appointments, including a basic  metabolic panel later this week in our [place] office, as well as  follow-up with Dr. [name] and follow-up with Dr. Darrick Penna.  She is  being discharged home today in good condition.   DISCHARGE LABS:  Hemoglobin 9.6, hematocrit 28.6, WBC 6.3, platelets  290, INR 1.0,  sodium 131, potassium 3.9, chloride 101, CO2 of 23, BUN  48, creatinine 1.47, glucose 88, total bilirubin 0.8, alkaline  phosphatase 142, AST 78, ALT 32 (on admission).  Albumin 3.0, calcium  8.9 phosphorus 4.3, CK 219, MB 5.8, troponin-I is 4.38.  BNP 521.0 on  January 29, 2008.  Serum iron 44, TIBC 239, B12 664.  Urinalysis was  negative.   DISPOSITION:  The patient is being discharged home today in good  condition.   FOLLOW-UP PLANS AND APPOINTMENTS:  The patient will follow up for a  basic metabolic panel and CBC on Thursday, February 04, 2008, at the [facility] [place] office at 10:00 a.m.  She will subsequently follow  up with Dr. [name] on February 11, 2008, at 10:30 a.m.Marland Kitchen  She has a follow  with [name] on February 19, 2008, at 11:00 a.m.  We have asked her to  follow up with Dr. Hyacinth Meeker in the next 1-2 weeks.   DISCHARGE MEDICATIONS:  1. Atenolol 25 mg nightly.  2. Demadex 20 mg daily.  3. Aspirin 325 mg daily.  4. Synthroid 50 mcg daily.  5. Prilosec 20 mg daily.  6. Biotin 300 mcg daily.  7. Vitamin D 40 mg daily.  8. Citalopram 10 mg daily.  9. Flora-Q daily.  10.Xanax 0.25 mg 1/2 tablet nightly.  11.Pravachol 40 mg nightly.  12.Trazodone 50 mg nightly.  13.Multivitamin  daily.  14.Plavix 75 mg daily x30 days.  15.* Notably Coumadin is held at the time being, however, should be      reinitiated following 30 days of Plavix therapy.  Coumadin Clinic      follow-up will remain with Dr. Hyacinth Meeker.   OUTSTANDING LAB STUDIES:  BMET is pending today.   DURATION DISCHARGE ENCOUNTER:  90 minutes, including physician time.      Nicolasa Ducking, ANP      Bruce R. Juanda Chance, MD, [facility]  Electronically Signed    CB/MEDQ  D:  02/01/2008  T:  02/01/2008  Job:  [id number]   cc:   Lowanda Foster. [name], M.D.

## 2011-01-08 NOTE — Consult Note (Signed)
[name] [name]           ACCOUNT NO.:  [id number]   MEDICAL RECORD NO.:  [id number]          PATIENT TYPE:  INP   LOCATION:  4736                         FACILITY:  [facility]   PHYSICIAN:  James L. Deterding, M.D.DATE OF BIRTH:  [birth year]   DATE OF CONSULTATION:  01/30/2008  DATE OF DISCHARGE:                                 CONSULTATION   CONSULTING PHYSICIAN:  Bruce R. Juanda Chance, MD, [facility].   REASON FOR CONSULTATION:  Chronic kidney disease and hypertension.   HISTORY OF PRESENT ILLNESS:  This is an 77 year old female with greater  than 20-year history of hypertension, history of CABG in 1998, history  of cath and PCI in 2000 and 2004, and actually it was done again on Jan 19, 2008 and Jan 22, 2008.  She has a history of endometriosis,  diverticular disease, irritable bowel, DJD, anemia, depression, anxiety,  peripheral vascular disease, hyperthyroidism, renal artery stenosis,  status post multiple procedures including last one in 2004, history of  chronic kidney disease, creatinine of 1.2.  She was admitted on Jan 11, 2008, after an acute MI and also in the setting of severe hypotension  and on nonsteroidals with an ARB.  The creatinine here was high as 6.5  and went back to 1.4 to 1.7.  She had a cath as mentioned above.  We  were asked her to see her regarding her creatinine and hypertension.  Her creatinine recently was in 1.4 to 1.7 range.   FAMILY HISTORY:  She has positive family history of hypertension.  Her  father died at age 60 of an MI and had high blood pressure.  Her mother  had a high blood pressure also.  Her father had coronary artery disease.  No family history of renal disease, hearing defects, eye defects, or  musculoskeletal defects.  She has no history of rheumatic fever or  scarlet fever or Bright.  She has history of UTIs, but no stones.  She  has chronic nonsteroidal use and has a history of ARB and ACE inhibitor.  EF is about 45%-55%.   CURRENT  MEDICATIONS:  Include,  1. Levothyroxine 50 mcg a day.  2. Pantoprazole 40 mg a day.  3. Simvastatin 40 mg a day.  4. Alprazolam 0.25 nightly.  5. Flora-Q intestinal flora each day.  6. Align once a day.  7. Guaifenesin 600 mg b.i.d.  8. Azithromycin 250 mg daily.  9. Docusate 100 mg b.i.d.  10.Clopidogrel 75 mg a day.  11.Aspirin 325 mg a day.  12.Enoxaparin 30 mg injection q.24 hours.  13.Warfarin and enoxaparin, both on hold.  14.Torsemide 200 mg per day.  15.__________ 5 mg a day.  16.Tenormin 25 mg b.i.d.  17.Ondansetron p.r.n.  18.Alprazolam.  19.Loperamide.  20.Acetaminophen.  21.Mupirocin.  22.Maalox.  23.At home, she had been on Benicar which had been changed recently.  24.Also, she has been on meloxicam.   PAST MEDICAL HISTORY AND OPERATIVE HISTORY:  She has had 2 operations  for endometriosis, and 1 operation for hysterectomy, also an operation  of an ovarian cyst.  The last being at [facility] in the last few  years.  History of cataracts, history of tonsillectomy, history of  catheterization and CABG.   ALLERGIES:  PENICILLIN and intolerance to CODEINE.   SOCIAL HISTORY:  Occasional social drink.  She smoked for several years  less than a pack a day in her 40s and 70s and has not smoked for over 20  years now.  She lives with her husband in [place].   FAMILY HISTORY:  Father died at age of 30.  Mother died in her 24s of  venous stasis complications but father died of an MI in his 83s.  She  has no brothers or sisters.  Has 3 children, one with hypertension.   PHYSICAL EXAMINATION:  GENERAL:  In no acute distress, average female  with somewhat slow mentation, seems somewhat lethargic today.  She is  oriented, but memory is not totally clear at times.  VITAL SIGNS:  Temperature 97.8, blood pressure 113/52 by the nurse.  There is no blood pressure cuff in the room.  95% room air saturation.  HEENT:  Fundi are benign, no scarring.  Disks are sharp.  Pharynx is   unremarkable.  NECK:  Without mass or thyromegaly.  CARDIOVASCULAR:  Irregular with rate in the 60s.  Grade 2/6 holosystolic  murmur heard best at the apex.  PMI is 10 cm lateral to midsternal line  at the fifth intercostal space.  No thrills.  She has trace edema, 1+ dorsalis pedal pulses, rest is 2+ to 4+.  No  bruits are noted.  LUNGS:  Reveal some large airway sounds with wheezing.  Occasional  rhonchi.  No rales to percussion or expansion.  ABDOMEN:  Liver is down 2-3 cm.  Soft and nontender.  No organomegaly.  EXTREMITIES:  Show venous stasis with some pigment changes and also  prominent venous varicosities in the lower extremities and foot  deformities bilaterally.  NEUROLOGIC:  Cranial nerves II through XII grossly intact.  Motor is 4/5  and symmetric.   LABORATORY DATA:  Hemoglobin 9.2, white count 6900, and platelet  256,000.  INR 1.0.  Sodium 142, potassium 4.9, chloride 102, bicarbonate  20, creatinine 1.4, BUN 45, glucose of 89.  Ultrasound, right 9.3 and  left 8.2.   ASSESSMENT:  1. Chronic kidney disease stage III complications including      hypertension, acidosis, anemia, and salt water retention.  Rule out      secondary hyperthyroidism or acute kidney injury she had is related      to low blood pressure in the setting of an ACE inhibitor and      renovascular disease.  Volume is okay now, and need to avoid      hypotension, i.e, excess medications which have been a problem      during this hospitalization in addition to admission.  Need to work      up for complication and educate her regarding medicines, diet, and      other issues.  2. Anemia.  Her iron has been low.  We bolus with some IV iron.  She      also had anemia of chronic kidney disease, gave her some      erythropoietin.  3. Secondary hyperthyroidism.  4. Decreased bicarbonate, needs close followup especially in the      setting of ACE inhibitor.  5. Coronary artery disease, per cardiology.  6.  Hypertension.  The atenolol __________ beta-blocker to be very      cautious with increase in chronic kidney disease.  Use  it once a      day.  May need longer half-life ACE inhibitor, but agree with use      at the current time with education.  Needs torsemide.  We also      discussed monitoring 2-3 times a week, not excessively.  Also, her      average blood pressure goals in chronic kidney disease and coronary      disease.  7. Congestive heart failure.  8. Renal artery stenosis.  Follow up with [facility].   PLAN:  __________  Tylenol, continue torsemide, iron IV, erythropoietin,  check parathyroid hormone, check urinalysis, avoid nonsteroidals.           ______________________________  Llana Aliment Deterding, M.D.     JLD/MEDQ  D:  01/30/2008  T:  01/31/2008  Job:  [id number]   cc:   Bethann Punches

## 2011-01-08 NOTE — Assessment & Plan Note (Signed)
[facility] OFFICE NOTE   CIERRIA, HEIGHT                  MRN:          [id number]  DATE:09/09/2008                            DOB:          August 07, 1923    PRIMARY CARE PHYSICIAN:  Dr. Bethann Punches.   NEPHROLOGIST:  Llana Aliment. Deterding, MD   INTERVAL HISTORY:  Ms. Margerum is a delightful 77 year old woman with  multiple medical problems including coronary artery disease status post  bypass surgery in 1998 with several subsequent coronary interventions.  Most recently, she had angioplasty and stenting of the RCA with 2 bare-  metal stents in June 2009.  She also has a history of chronic atrial  fibrillation, diastolic heart failure, anemia, renal insufficiency, and  renal artery stenosis status post right renal artery stenting.  Most  recent echocardiogram done in November 2009 showed an EF of 55%.  There  is moderate to severe pulmonary hypertension.   She returns today for routine followup.  She states she is doing great.  She ambulates slowly with a walker, but denies any chest pain or  shortness of breath.  Her energy is much better.  She has not had any  problems with bleeding on her Coumadin.  Her lower extremity edema has  been well controlled with Demadex.  She denies any orthopnea or PND.  Herself describes as crossword puzzle addict.   She has been following her blood pressures at home, systolics primarily  in the 130 range with occasional readings up to 140.  Dr. Hyacinth Meeker has  titrated her hydralazine.  She is now taking it 4 times a day and this  has been very helpful for her.   CURRENT MEDICATIONS:  1. Lotensin 10 b.i.d.  2. Aspirin 81 a day.  3. Coumadin.  4. Tramadol.  5. Hydralazine 25 q.i.d.  6. Synthroid 50 mcg a day.  7. Prilosec 20 a day.  8. Biotin.  9. Vitamin D.  10.Xanax 0.25 mg nightly.  11.Pravachol 40 a day.  12.Trazodone align.  13.Toprol 25 b.i.d.  14.Demadex 10 mg  in the morning.   PHYSICAL EXAMINATION:  GENERAL:  She is an elderly woman in no acute  distress, ambulates around the clinic slowly with no respiratory  difficulty.  VITAL SIGNS:  Blood pressure was 134/64, heart rate 74, and weight is  120.  HEENT:  Normal.  NECK:  Supple.  No JVD.  Carotids are 2+ bilaterally without any bruits.  There are no lymphadenopathy or thyromegaly.  CARDIAC:  PMI is  nondisplaced.  She is irregularly irregular.  No murmurs, rubs, or  gallops.  LUNGS:  Clear.  ABDOMEN:  Soft, nontender, and nondistended.  No hepatosplenomegaly.  No  bruits.  No masses.  EXTREMITIES:  Warm with no cyanosis, clubbing, or edema.  She has marked  changes due to her arthritis.  No rash.  NEURO:  Alert and x3.  Cranial nerves II-XII are intact.  Moves all 4  extremities without difficulty.  Affect is pleasant.   EKG shows atrial fibrillation at a rate of 68.  No significant ST-T wave  abnormalities.  ASSESSMENT AND PLAN:  1. Coronary artery disease.  This is very stable.  No evidence of      ischemia.  Continue current therapy.  2. Hypertension.  Blood pressure overall is pretty good.  It perhaps      just minimally elevated.  This is followed closely by Dr. Hyacinth Meeker.      I did tell her that her systolics were persistently over 140.  We      could consider increasing her hydralazine to 1-1/2 tablets 4 times      a day. 3.  Renal artery stenosis.  She is due for followup      ultrasound, it has been 5 years.  We will check this.  3. Hyperlipidemia.  Given her vascular disease, her goal LDL is less      than 70.  This is followed by Dr. Hyacinth Meeker.  We can titrate her      Pravachol as needed.  4. Atrial fibrillation.  She is doing well with rate control.      Continue Coumadin.   DISPOSITION:  Overall, she is doing quite well.  We will see her back in  4 months for routine followup.     Bevelyn Buckles. Bensimhon, MD  Electronically Signed    DRB/MedQ  DD: 09/09/2008  DT:  09/10/2008  Job #: [id number]   cc:   Fayrene Fearing L. Deterding, M.D.  Bethann Punches

## 2011-01-08 NOTE — Assessment & Plan Note (Signed)
[facility] OFFICE NOTE   [name], [name]                  MRN:          [id number]  DATE:02/11/2008                            DOB:          May 04, 1923    PRIMARY CARE PHYSICIAN:  [name] Punches, MD   NEPHROLOGIST:  [name] Aliment. Deterding, MD   INTERVAL HISTORY:  [name] is an 77 year old woman with a history of  coronary artery disease, status post bypass surgery in 1998 and several  coronary interventions.  She also has a history of renal artery  stenosis, which has also been intervened upon.  Remainder of her history  is notable for chronic atrial fibrillation, diastolic heart failure,  anemia, and renal insufficiency as well as pulmonary hypertension.   She was recently admitted to the hospital with weakness and found to  have severe hypotension and acute renal failure with a creatinine as  high as 6.5.  This was in the setting of a non-ST elevation myocardial  infarction.  She was hydrated.  Her kidney function got much better.  Echocardiogram showed an ejection fraction of 45%-50% with moderate  mitral regurgitation.  There is also moderate to severe tricuspid  regurgitation with elevated pulmonary pressures.  She was then taken to  the cath lab by [name] and underwent angioplasty with the proximal  RCA with 2 bare-metal stents.   Her post cath course was complicated by groin hematoma, but there was no  retroperitoneal hemorrhage.  Given hematoma and the fact that she was  already on aspirin and Plavix, her Coumadin was not restarted.   Her functional capacity was quite poor, but she started working with  physical therapy and was able to be discharged home.   She returns for routine followup.  From a cardiac point of view, she  says she is doing very well.  She is starting to do more exercises with  home physical therapy.  She has not had any chest pain.  No significant  heart failure.  No  orthopnea or PND.  Her main complaint is severe right  shoulder and neck pain mostly in her trapezius muscles and she recently  got injected by Dr. Bethann [name], but this did not help.   CURRENT MEDICATIONS:  1. Atenolol 25 mg nightly.  2. Demadex 20 mg a day.  3. Aspirin 325 a day.  4. Synthroid 50 mcg a day.  5. Prilosec 20 a day.  6. Biotin.  7. Vitamin D.  8. Xanax 0.25 nightly.  9. Pravachol 40 a day.  10.Trazodone 50 mg a day.  11.Plavix 75 a day.  12.Multivitamin.   ALLERGIES:  PENICILLIN.   PHYSICAL EXAM:  GENERAL:  She is an elderly woman in some distress due  to her shoulder pain.  VITAL SIGNS:  Blood pressure is 150/80, heart rate 75, weight is 131.  HEENT.  Normal.  NECK:  Supple.  There is no JVD.  Carotid are 2+ bilaterally with no  audible bruits.  There is no lymphadenopathy or thyromegaly.  CARDIAC:  PMI is nondisplaced.  She is irregular with 2/6 systolic ejection  murmur  at the left sternal border and 2/6 mitral regurgitation murmur.  LUNGS:  Clear.  ABDOMEN:  Soft, nontender, and nondistended.  There is no  hepatosplenomegaly.  No bruits, no masses.  EXTREMITIES:  Warm.  There is no cyanosis, clubbing, or edema.  She does  have a large ecchymosis on her left groin.  This is resolving.  There is  no hematoma.  There is no bruit on the right.  She is sensitive to touch  on both groins.  She is markedly painful on palpation of her shoulder.  It does not appear septic.  It is not hot.  She is very tender to  palpation over her trapezius muscles.   EKG shows atrial fibrillation at a rate of 75.  No acute ST-T wave  abnormalities.   ASSESSMENT/PLAN:  1. Coronary artery disease, status post recent non-ST elevation      myocardial infarction.  She is doing well.  No evidence of      ischemia.  Continue current therapy.  2. Chronic atrial fibrillation.  At some point, I would like to get      her back on Coumadin; however, I think it is prudent to wait a few       months.  We will treat her with aspirin and Plavix for her bare-      metal stents.  I suspect that at the next visit, we can stop the      Plavix and put her back on her Coumadin with fairly low risk of      bleeding.  3. Volume status.  Volume status looks quite good.  Both her and her      husband who is a pharmacist are quite concerned about her getting      dehydrated again and they would like to take the torsemide with      Demadex only as needed.  I said this is fine as long as they follow      her weights and swelling very very closely.  We reviewed in depth      the use of sliding scale diuretic regimen.  4. Need for estrogen.  She was previously on Premarin and they are      wondering why this was stopped in the hospital.  We once again had      a long discussion about the risk-benefit profile of Premarin.  If      at all possible, given her recent heart attack, I would like to      stay off it given its procoagulant effects, but should she need it      for symptoms, I do think it would be reasonable to go back on low-      dose once again only if she needs it.  5. Shoulder pain.  She will follow up with Dr. Hyacinth [name] and possibly her      orthopedist.  We did check a sed rate to evaluate for further      possibility of PMR.   DISPOSITION:  We will see her back in 3 to 4 months for routine  followup.   NOTE:  Total time spent in the encounter including dictation is about 45  minutes.     [name] Buckles. Bensimhon, MD  Electronically Signed    DRB/MedQ  DD: 02/11/2008  DT: 02/12/2008  Job #: [id number]   cc:   [name], M.D.

## 2011-01-08 NOTE — Assessment & Plan Note (Signed)
[facility] OFFICE NOTE   KJERSTEN, ORMISTON                  MRN:          [id number]  DATE:05/05/2008                            DOB:          [birth year]    PRIMARY CARE PHYSICIAN:  Dr. Bethann Punches.   NEPHROLOGIST:  Llana Aliment. Deterding, M.D.   INTERVAL HISTORY:  Ms. Cedotal is an 77 year old woman with multiple  medical problems including coronary artery disease status post bypass  surgery in 1998 with subsequent several coronary intervention.  She also  has a history of renal artery stenosis, which has been intervened upon.  Remainder of her medical history is notable for chronic atrial  fibrillation, diastolic heart failure, anemia, renal insufficiency, and  pulmonary hypertension.   This past summer, she was very sick.  She was admitted to hospital with  hypotension and acute renal failure in the setting of a non-ST elevation  myocardial infarction, EF was 45-50%.  She underwent angioplasty and  stenting of the RCA with 2 bare-metal stents.  She also had a right  groin hematoma and was taken off her Coumadin, and was just treated with  aspirin and Plavix.   She returns today for routine followup.  She looks absolutely great.  She is getting around slowly with a walker, but denies any chest pain or  shortness of breath.  Her energy has markedly picked up.  She has not  had any problems with bleeding.  Occasional edema, which has done very  well with her Demadex.   CURRENT MEDICATIONS:  1. Torsemide 10 mg a day as needed.  2. Aspirin 325 a day.  3. Synthroid 50 mcg a day.  4. Prilosec 20 a day.  5. Biotin vitamin D.  6. Flora-Q.  7. Xanax 0.25 mg at night.  8. Pravachol 40 a day.  9. Trazodone 50 a day.  10.Multivitamin.  11.Plavix 75 a day.  12.Toprol 25 mg b.i.d.  13.Lotensin 10 b.i.d.   PHYSICAL EXAMINATION:  GENERAL:  She is an elderly woman and frail, but  no acute distress,  ambulatory across the clinic slowly without any  respiratory difficulty.  VITAL SIGNS:  Blood pressure is 112/60, heart rate 58, weight 127.  HEENT:  Normal.  NECK:  Supple.  No JVD, carotid are 2+ bilaterally without bruits.  There is no lymphadenopathy or thyromegaly.  CARDIAC:  She is irregularly irregular.  No murmurs, rubs, or gallops.  LUNGS:  Clear.  ABDOMEN:  Soft, nontender, nondistended, no hepatosplenomegaly, no  bruits, no mass.  EXTREMITIES:  Warm with no cyanosis, clubbing, or edema.  No rash.  NEURO:  Alert and oriented x3.  Cranial nerves II-XII are intact.  Moves  all 4 extremities without difficulty.  Affect is pleasant.   EKG shows atrial fibrillation at a rate of 58.  No ST-T wave  abnormalities.   ASSESSMENT AND PLAN:  1. Coronary artery disease.  She is doing very well.  No evidence of      ischemia.  Continue current therapy.  We will check an      echocardiogram to make sure her  ejection fraction is stable.  2. Hypertension, well-controlled.  3. Atrial fibrillation.  She is at a significant risk for strokes      based on her CHADS2 score.  We will stop her Plavix and decrease      her aspirin to 81 mg and restart her Coumadin.  She will get [place] the      other dose she was on previously.  She will follow up with Dr.      Hyacinth Meeker for INR check on Thursday.  Her Coumadin will be followed      from Dr. Rondel Baton office as before.   DISPOSITION:  She is doing well.  We will see her back in 21-month for  routine followup.     Bevelyn Buckles. Bensimhon, MD  Electronically Signed    DRB/MedQ  DD: 05/05/2008  DT: 05/06/2008  Job #: [id number]   cc:   Lowanda Foster. Deterding, M.D.

## 2011-01-08 NOTE — Assessment & Plan Note (Signed)
[name] OFFICE NOTE   [name], [name]                  MRN:          [id number]  DATE:11/14/2008                            DOB:          08/06/1923    PRIMARY CARDIOLOGIST:  Bevelyn Buckles. Bensimhon, MD   PRIMARY CARE PHYSICIAN:  Dr. Bethann Punches.   PRIMARY NEPHROLOGIST:  Fayrene Fearing L. Deterding, MD   HISTORY OF PRESENT ILLNESS:  [name] is a delightful 77 year old  Caucasian female with multiple medical problems including coronary  artery disease status post coronary artery bypass grafting surgery in  1988 with several subsequent coronary interventions, most recently  angioplasty and stenting of the right coronary artery with 2 bare-metal  stents in June 2009, chronic atrial fibrillation, diastolic heart  failure, anemia, renal insufficiency, hypertension, hyperlipidemia, and  known renal artery stenosis with several interventions at the ostium of  the right renal artery.  Her most recent renal artery intervention was  in November 2004, at which time, Dr. Randa Evens placed a Cypher drug-  eluting stent at the ostium of the right renal artery.  Angiography at  that time also demonstrated a 60% stenosis of the ostium of the left  renal artery.  The patient has been doing well clinically over the last  year.  She was most recently seen by Dr. [name] on September 09, 2008.  At that time, he ordered bilateral renal artery Doppler studies which  showed small kidneys bilaterally with a 1-59% right renal artery  stenosis and greater than 60% ostial left renal artery stenosis.  She  was referred to the Peripheral Vascular Clinic today for further  evaluation of her renal artery stenosis.  She has had problems with  hypertension during her entire adult life.  Her hypertension has been  treated with multiple medications and has been well controlled per the  patient and her husband.  She tells me that she had  uncontrolled  hypertension prior to the stenting of the right renal artery.  Her  husband is a retired Teacher, early years/pre and helps manage her medications.  They  have taken a detailed log of her blood pressures which are mostly less  than 135 systolically.  There are several outliers with a systolic blood  pressure of 160.   The patient has no complaints today.  She denies any chest pain,  shortness of breath, palpitations, dizziness, near syncope, syncope,  orthopnea, PND, or lower extremity edema.  She also denies any recent  headaches, visual changes, or neurological disturbances.   PAST MEDICAL HISTORY:  1. Coronary artery disease status post CABG in 1988 and most recently      with 2 bare-metal stents placed in the right coronary artery in      June 2009.  2. Chronic atrial fibrillation on Coumadin.  3. Diastolic heart failure.  4. Anemia.  5. Chronic renal insufficiency.  6. Hypertension.  7. Hyperlipidemia.  8. Moderate-to-severe pulmonary hypertension.  9. Known bilateral renal artery stenoses status post placement of a      drug-eluting stent in the right renal artery in November 2004.  10.Irritable  bowel syndrome.  11.Diverticular disease.   PAST SURGICAL HISTORY:  1. CABG in 1998.  2. Right hip replacement in 1995.  3. Hysterectomy.  4. Ovarian cyst removal.   ALLERGIES:  PENICILLIN.   CURRENT MEDICATIONS:  1. Lotensin 10 mg twice daily.  2. Stool softener 2 tablets once daily.  3. Aspirin 81 mg once daily.  4. Coumadin as directed.  5. Tramadol 100 mg 3 times daily.  6. Hydralazine 50 mg 3 times daily.  7. Toprol-XL 25 mg in the morning, 12.5 mg in the evening.  8. Vitamin D 400 mg 2 tablets once daily.  9. Torsemide 20 mg one-fourth of a tablet once daily.  10.Xanax 0.25 mg 2 at night.   SOCIAL HISTORY:  The patient occasionally has an alcoholic beverage, but  denies the use of tobacco or illicit drugs.  She is married.   FAMILY HISTORY:  Noncontributory.    REVIEW OF SYSTEMS:  As stated in the history of present illness is  otherwise negative.   PHYSICAL EXAMINATION:  VITAL SIGNS:  Blood pressure 124/62, pulse 77 and  irregular, respirations 12 and nonlabored.  GENERAL:  She is a pleasant elderly Caucasian female in no acute  distress.  She is alert and oriented x3.  HEENT:  Normal.  Mucous membranes are moist.  SKIN:  Warm and dry.  NEUROLOGIC:  No focal neurological deficits.  MUSCULOSKELETAL:  Muscle strength is 5/5 in all extremities.  PSYCHIATRIC:  Mood and affect are appropriate.  NECK:  Supple.  No JVD.  Carotids are 2+ bilaterally without bruits.  There is no lymphadenopathy or thyromegaly noted.  LUNGS:  Clear to auscultation bilaterally without wheezes, rhonchi, or  crackles noted.  CARDIOVASCULAR:  Irregular irregular rhythm.  No loud murmurs are noted.  ABDOMEN:  Soft and nontender.  Bowel sounds are present.  No abdominal  bruits or masses are noted.  EXTREMITIES:  Warm with no cyanosis, clubbing, or edema.  SKIN:  There are no rashes noted.   DIAGNOSTIC STUDIES:  Bilateral renal artery duplex study performed on  September 21, 2008, shows normal caliber abdominal aorta with heavy  calcification.  There is no obvious stenosis of the distal aorta.  The  kidneys are small in size bilaterally measuring 7.3 cm on the left and  8.0 cm on the right.  The right renal artery is noted to have an  elevated velocity.  This is consistent with 1-59% stenosis.  The left  renal artery has severely elevated velocities suggesting a greater than  60% ostial left renal artery stenosis.   ASSESSMENT AND PLAN:  This is a pleasant 77 year old Caucasian female  with multiple medical problems as outlined above.  She was referred to  the Peripheral Vascular Clinic today for further evaluation of bilateral  renal artery stenoses.  As outlined above, her noninvasive renal  duplexes demonstrated moderate stenosis in the right renal artery and at   least moderately severe stenosis in the ostium of the left renal artery.  I have discussed this with the patient and I have offered to perform  further invasive testing.  The patient and her husband do not wish to  proceed with a renal artery angiogram at this time.  Her blood pressure  has been reasonably well controlled on the current medical therapy.  Her  renal function has been stable per our records over the last several  years.  She tells me that she had a recent blood work performed in the  office  of her nephrologist.  We will have the recent laboratory values  sent over to our office.  The patient and her husband are somewhat  reluctant to proceed to a diagnostic renal artery angiogram with  possible intervention at this time.  I think it is reasonable to delay  this procedure currently, as her blood pressure seems to be well  controlled.  I would like to see her back in our peripheral vascular  office in 6 months, at which time, we will repeat bilateral renal artery  duplex studies.  We will review her blood pressure control at that time,  and if necessary, we can schedule a diagnostic renal artery angiogram  with possible percutaneous intervention.  The patient will continue to  follow with Dr. [name] here in our cardiology office.  She will also  continue to follow with Dr. Darrick Penna of Nephrology and Dr. Bethann Punches  who is her primary care physician.     Verne Carrow, MD  Electronically Signed    CM/MedQ  DD: 11/14/2008  DT: 11/15/2008  Job #: [id number]   cc:   Lowanda Foster Deterding, M.D.

## 2011-01-11 NOTE — Discharge Summary (Signed)
[place]  Patient:    [name], [name]                  MRN: [id number] Adm. Date:  [id number] Disc. Date: [id number] Attending:  Lenoria Farrier Dictator:   Brita Romp, P.A. CC:         Particia Jasper, M.D., [facility] R. Juanda Chance, M.D. [facility]   Discharge Summary  DISCHARGE DIAGNOSES:  1. Coronary artery disease, status post bypass surgery in 1988.  2. Status post cardiac catheterization this admission.  3. Hypertension.  4. Hyperlipidemia.  5. Congestive heart failure.  HOSPITAL COURSE:  The patient came into the hospital on Dec 25, 2000, for same-day catheterization by Dr. Juanda Chance.  In the left anterior descending artery, catheterization revealed 95% diffuse in-stent restenosis.  In the circumflex artery there was a 70% proximal lesion and a totally occluded PL. In the right coronary artery there was a 50-70% proximal lesion, 50% at the stent, and 70% lesion in the PDA.  The saphenous vein graft to the diagonal and PL was normal.  The LIMA to the LAD graft was atrophic.  The left ventricle showed apical akinesis which was unchanged.  Ejection fraction was approximately 60%.  Dr. Juanda Chance felt that the in-stent restenosis in the LAD was not favorable for PCI and planned medical therapy with consideration of possible redo CABG.  The next day, the patient denied any chest pain or shortness of breath.  There was noted to be 2+ bilateral lower extremity edema.  The groin was stable without ecchymosis, bruit, hematoma; however, it was noted to be exquisitely tender to palpation.  As a result, an ultrasound was obtained to rule out pseudoaneurysm.  This test was negative.  Otherwise, Dr. Juanda Chance felt that the patient was stable for discharge.  DISCHARGE MEDICATIONS:  1. Verapamil 240 mg p.o. q.d.  2. Lipitor 10 mg p.o. q.h.s.  3. Premarin 0.625 mg p.o. q.d.  4. Folic acid 2 pills p.o. q.d.  5. Celebrex 200 mg 1 tablet p.o. b.i.d.  6. Klonopin 0.5 mg p.o. q.d.  7. Atenolol 25 mg p.o. q.d.  8. Cozaar 100 mg p.o. q.h.s.  9. Imdur 60 mg q.a.m., 30 mg q.p.m. 10. Lasix 40 mg q.a.m., 20 mg q.p.m. 11. K-Dur 20 mEq q.d. 12. Enteric-coated aspirin 325 mg q.d. 13. Darvocet-N 100 1-2 tablets q.6h. p.r.n., #21.  DISCHARGE INSTRUCTIONS:  1. The patient is to avoid driving, heavy lifting, or tub baths for two days.  2. She is to eat a low fat, low cholesterol diet.  3. She is to watch the catheterization site for any pain, bleeding, or     swelling and is to call the [place] office with any of these problems.  4. She is to have a follow-up BMET laboratory test on Jan 01, 2001.  5. She is to follow up with Dr. Juanda Chance in the office on Jan 06, 2001, at     10:30 in the morning.  LABORATORY DATA:  Sodium 139, potassium 3.8, chloride 105, CO2 28, BUN 25, creatinine 0.9, glucose 113.  White count 6.9, hemoglobin 11.4, hematocrit 34.4, platelets 274, RDW 16.8. DD:  12/26/00 TD:  12/28/00 Job: [id number]

## 2011-01-11 NOTE — Op Note (Signed)
   NAME:  [name], [name]                     ACCOUNT NO.:  [id number]   MEDICAL RECORD NO.:  [id number]                   PATIENT TYPE:  OIB   LOCATION:  2858                                 FACILITY:  [facility]   PHYSICIAN:  Salvadore Farber, M.D.             DATE OF BIRTH:  07-19-1923   DATE OF PROCEDURE:  05/03/2003  DATE OF DISCHARGE:                                 OPERATIVE REPORT   PROCEDURE:  Unilateral right renal angiography.   INDICATIONS FOR PROCEDURE:  Ms. Dara is a 77 year old lady status post  stenting of her right renal artery in November 2003 by Dr. Beverlyn Roux.  Initially, her hypertension was very well controlled prompting removal of  multiple antihypertensive medications. When I met her in June, she had  suffered recurrence of her severe hypertension. Renal angiography at the  time of coronary angiography had demonstrated 95% InStent restenosis of the  right renal artery. On February 16, 2003, I performed cutting balloon  angioplasty of that artery resulting in 20% residual stenosis. Her  hypertension has been well controlled since. However, routine surveillance,  duplex ultrasonography has demonstrated a recurrent high grade lesion. She  is brought to the lab today for confirmation of this finding.   DESCRIPTION OF PROCEDURE:  Informed consent was obtained. Under 1% lidocaine  local anesthesia, a 5 French sheath was placed in the right femoral artery  using the modified Seldinger technique. A 5 [place] JR4 catheter was advanced  over a wire and engaged at the ostium of the right renal artery. Angiography  was performed by hand injection. The patient tolerated the procedure well  and was transferred to the holding room in stable condition. Sheaths are to  be removed there.   COMPLICATIONS:  None.   FINDINGS:  80% InStent restenosis of the right renal artery. This involves  the entirety of the stented segment.   IMPRESSION/RECOMMENDATIONS:  Patient with recurrent  InStent restenosis of  the right renal artery. Will explore options for investigational  brachytherapy versus drug eluding stenting of this vessel.                                               Salvadore Farber, M.D.    WED/MEDQ  D:  05/03/2003  T:  05/03/2003  Job:  [id number]   cc:   Mirian Capuchin, M.D.  [place], [place]

## 2011-01-11 NOTE — Cardiovascular Report (Signed)
NAME:  [name], [name]                     ACCOUNT NO.:  [id number]   MEDICAL RECORD NO.:  [id number]                   PATIENT TYPE:  OIB   LOCATION:  5729                                 FACILITY:  [facility]   PHYSICIAN:  Salvadore Farber, M.D.             DATE OF BIRTH:  Oct 23, 1922   DATE OF PROCEDURE:  06/28/2003  DATE OF DISCHARGE:                              CARDIAC CATHETERIZATION   PROCEDURE:  1. Drug-eluting stent placement in the right renal artery.  2. AngioSeal right femoral artery.   INDICATIONS:  [name] is a 77 year old lady status post stenting of the  ostium of the right renal artery by Lebron Conners, M.D. in late 2003.  She  presented to me in June 2004 with restenosis with recurrent hypertension.  I  performed cutting balloon angioplasty.  Unfortunately, hypertension  recurred.  Repeat angiography performed early September of this year  demonstrated recurrence of the in-stent restenosis.  Discussions were had  with the patient regarding repeat balloon angioplasty, surgical  revascularization, brachytherapy, and drug-eluting stent placement.  Brachytherapy was not available in [place] or at [place].  Results of  recurrent angioplasty were unlikely to be good over the long-term with high  risk of recurrent restenosis.  The patient preferred drug-eluting stent  placement over surgery.  The absence of proven efficacy in renal arteries  was discussed in detail with the patient and her husband.   PROCEDURAL TECHNIQUE:  Informed consent was obtained as detailed above.  Under 1% lidocaine local anesthesia a 6-French sheath was placed in the  right femoral artery using the modified Seldinger technique.  4000 units of  heparin was administered resulting in an ACT of 252 seconds.  The stent was  visualized directly.  Using no-touch technique a stabilizer wire was  advanced via the 6-French JR4 catheter into the renal artery.  A 4.0 x 15 mm  VIATRAC balloon was then  advanced over this wire.  It felt as if the balloon  was going through a stent strut.  Therefore, balloon and wire were removed  and the artery reengaged.  Now, the balloon flowed smoothly.  This balloon  was dilated to 10 atmospheres for 30 seconds.  A 3.5 x 18 mm Cypher stent  was then positioned across the lesion so as to match the proximal portion of  the previously placed stent and extend 3 mm beyond it.  It was deployed at  14 atmospheres.  The entirety of the stent was then post dilated using a 4.5  x 20 mm Quantum Maverick with the excess portion of the balloon protruding  into the aorta.  This was inflated to 14 atmospheres for 60 seconds.  Final  angiogram demonstrated no residual stenosis, excellent flow, and a normal  nephrogram.  After angiography demonstrated that the sheath entered the  common femoral artery, the arteriotomy was closed using a 6-French AngioSeal  device resulting in excellent hemostasis.  The patient  tolerated the  procedure well and was transferred to the holding room in stable condition.   COMPLICATIONS:  None.    IMPRESSION AND PLAN:  Successful stenting of recurrent in-stent restenosis  of the right renal artery using a drug-eluting stent.  This reduced the  stenosis from 80 to 0%.  Will continue Plavix through June 2005 due to her  acute coronary syndrome in June of last year.  Aspirin will be continued  indefinitely.  Will plan surveillance renal duplex scan at three months.                                               Salvadore Farber, M.D.    WED/MEDQ  D:  06/28/2003  T:  06/28/2003  Job:  [id number]   cc:   Mirian Capuchin, M.D.

## 2011-01-11 NOTE — Op Note (Signed)
[name], [name]                     ACCOUNT NO.:  [id number]   MEDICAL RECORD NO.:  [id number]                   PATIENT TYPE:  INP   LOCATION:  2928                                 FACILITY:  [facility]   PHYSICIAN:  Salvadore Farber, M.D.             DATE OF BIRTH:  Jul 09, 1923   DATE OF PROCEDURE:  02/16/2003  DATE OF DISCHARGE:                                 OPERATIVE REPORT   PROCEDURE:  Right renal cutting balloon angioplasty for in-stent restenosis.   INDICATIONS FOR PROCEDURE:  The patient is a 77 year old lady with coronary  and peripheral vascular disease.  In November of 2003, Dr. Orson Slick placed a  stent in her right renal artery.  Initially, her hypertension was very well  controlled prompting removal of multiple medications with good control of  her blood pressure.  However, over the subsequent months, her blood pressure  has become more difficult to control requiring reescalation to three  medications.  At coronary angiography, Dr. Juanda Chance therefore performed  abdominal aortography which demonstrated 95% in-stent restenosis of the  right renal artery.  Ultrasound demonstrated a right kidney size of 8.9 cm.  The decision was therefore made for repeat revascularization of the right  renal artery.   DESCRIPTION OF PROCEDURE:  Informed consent was obtained.  Under 1%  lidocaine local anesthesia, a 6 French sheath was placed in the left femoral  artery using the modified Seldinger technique, a 6 French LIMA guide was  advanced over a wire and engaged in the ostium of the right renal artery. A  [place] core wire was advanced through the lesion into the distal renal  artery.  Initial attempt was made to pass a cutting balloon. However, this  could not be passed beyond the lesion. Therefore, the lesion was predilated  using a 3.0 x 15 mm Maverick at 10 atmospheres.  The cutting balloon then  passed smoothly. A 4.0 x 10 mm cutting balloon was then positioned in the  region of restenosis.  A total of four sequential inflations at 8  atmospheres and one at 10 atmospheres were performed, rotating the balloon  catheter between inflations to allow multiple planes of cutting.  Final  angiogram demonstrated approximately 20% residual stenosis with no  dissection.  Flow to the renal parenchyma was unchanged. The patient  tolerated the procedure well and will be transferred to the CCU.   COMPLICATIONS:  None.    IMPRESSION:  Successful cutting balloon angioplasty of in-stent restenosis  of the right renal artery. Will plan on continuing aspirin and Ticlid.  Ticlid will be continued for six months since she had drug-eluding stent  placed in her coronary artery recently. Sheath will be removed when the ACT  is less than 175 seconds.   The patient will require close ultrasound surveillance as there is a  substantial risk of recurrent restenosis.  Salvadore Farber, M.D.    WED/MEDQ  D:  02/16/2003  T:  02/16/2003  Job:  [id number]   cc:   Charlies Constable, M.D.   Mirian Capuchin, M.D. [place]   Lorre Munroe., M.D.  Fax: [phone]

## 2011-01-11 NOTE — Cardiovascular Report (Signed)
. [facility]  Patient:    [name], [name]                  MRN: [id number] Proc. Date: 04/10/00 Adm. Date:  [id number] Attending:  Lenoria Farrier CC:         Danella Penton, M.D., [place], [place]             [facility]                        Cardiac Catheterization  CLINICAL HISTORY:  Ms. Thackston is 77 years old and had bypass surgery in 1988. In January of this year, she had stenting of a mid right coronary artery lesion after a non-Q-wave infarction.  She had 80% narrowing in the proximal LAD, with an atrophic LIMA to the LAD, and we subsequently did a Cardiolite scan which was negative for ischemia, so this was treated medically.  She did fairly well, she had some intermittent chest pain, but recently her chest pains have become more frequent and she had a Cardiolite scan done in [place] which suggested anterior ischemia; she also has an apical scar, which we think is old.  Because of this,  she was brought in for evaluation with catheterization, with thoughts that she might need intervention on her LAD.  DESCRIPTION OF PROCEDURE:  The procedure was performed via the right femoral artery using an arterial sheath and 6-French preformed coronary catheters.  A femoral arterial puncture was performed and Omnipaque was used.  After completion of the diagnostic study, we made a decision to proceed with intervention on the LAD.  Patient was given weight-adjusted heparin to prolong the ACT greater than 200 seconds and was given double-bolus Integrilin and infusion.  We used the JL3 3.0 7-French guiding catheter and a short floppy wire.  We had to make a sharp bend in the wire in order to enter the LAD but crossed the lesion in the proximal LAD without too much difficulty.  There was some difficulty visualizing the proximal LAD because there was competing flow from the vein graft which fed a diagonal branch.  We  direct-stented this with a 23-mm 2.5 Tetra, but when we inflated the balloon, there was a rupture with a leak of contrast and probably some air which was within the balloon downstream.  The stent was only very partially deployed.  Patient did not lose flow down the vessel but did have chest pain and ST segment elevation.  We were able to pull the balloon back, with the stent still on the balloon.  The stent started to slide distally and we inflated the balloon about 2 atmospheres and pulled the stent the rest of the way out up to the guiding catheter.  We then removed the whole system, with guiding catheter, balloon and stent and wire together.  Repeat pictures of the vessel showed the vessel was open but there was a nonobstructive dissection in the more proximal portion of the LAD.  We next went back in with another 2.5 23-mm Tetra and deployed this with one inflation of 12 atmospheres for 40 seconds.  We then post-dilated with a 20-mm 2.75 Quantum Ranger, performing one inflation of 18 atmospheres for 30 seconds, and a second one at 14 atmospheres for 32 seconds at the proximal edge.  We then deployed a second stent to seal the dissection using an AVE 2.5 24-mm S660 stent; we deployed this with  one inflation of 12 atmospheres for 68 seconds.  This stent just overlapped the more distal stent.  We then post-dilated again with the 2.75 20-mm Quantum Ranger, performing two inflations of 18 atmospheres for 34 seconds and 15 atmospheres for 36 seconds.  Repeat diagnostics were then performed through the guiding catheter.  Despite the long nature of the procedure, the patient tolerated the procedure well and left the laboratory in satisfactory condition.  RESULTS Left main coronary artery:  The left main coronary artery is a short vessel that is free of significant disease.  Left anterior descending artery:  The left anterior descending artery was segmentally diseased proximally with a long  40% narrowing and there was 80% focal stenosis at the septal perforator with segmental disease extending another 18 to 20 mm.  There was some competing flow from a diagonal branch which was fed by a vein graft.  The distal vessel gave rise to several more septal perforators.  Circumflex artery:  The circumflex artery gave rise to a small marginal branch, a larger marginal branch and then had complete occlusion of a posterolateral branch.  Right coronary artery:  The right coronary artery is a moderately large vessel that gave rise to a conus branch, three right ventricular branches, a posterior descending branch and two posterolateral branches.  There was 70% in-stent restenosis in the right coronary artery.  The saphenous vein graft to the diagonal branch of the LAD and posterolateral branch of the circumflex artery was patent and functioned normally, but there was 50% narrowing distal to the insertion site and another 70% narrowing in one of the subbranches of the posterolateral vessel.  The LIMA graft to the LAD was atrophic and no flow was seen to go down the LAD.  LEFT VENTRICULOGRAM:  The left ventriculogram performed in the RAO projection showed akinesis at the very tip of the apex.  The overall wall motion was well-preserved and the estimated ejection fraction was about 60%.  Following placement of tandem overlying stents in the proximal LAD, the stenosis improved from 80% to 0%.  There was no residual dissection.  CONCLUSION 1. Coronary artery disease, status post prior coronary artery bypass graft    surgery in 1988 and status post stenting of the mid right coronary artery    in January of 2001, with 70% in-stent narrowing in the right coronary    artery, 80% narrowing in the proximal left anterior descending, total    occlusion of the posterolateral branch of the circumflex artery.  The    grafts show a patent sequential vein graft to the diagonal branch of the    left  anterior descending and the posterolateral branch of the circumflex    artery with 70% distal stenosis and the left internal mammary artery graft     to the left anterior descending is atrophic. 2. Successful stenting of the proximal left anterior descending, with    placement of two tandem overlying stents and improvement in the percent of    narrowing from 80% to 0%.  DISPOSITION:  Patient was returned to the post-angiography unit for further observation.  We felt fortunate to get out of the laboratory with a good anatomic result and with the patient stable; however, since she has two long 2.5 stents, the risk of recurrence will be fairly high. DD:  04/10/00 TD:  04/11/00 Job: [id number]

## 2011-01-11 NOTE — Discharge Summary (Signed)
NAME:  [name], ACHEY                     ACCOUNT NO.:  [id number]   MEDICAL RECORD NO.:  [id number]                   PATIENT TYPE:  OIB   LOCATION:  5729                                 FACILITY:  [facility]   PHYSICIAN:  Salvadore Farber, M.D.             DATE OF BIRTH:  10-Mar-1923   DATE OF ADMISSION:  06/28/2003  DATE OF DISCHARGE:  06/29/2003                           DISCHARGE SUMMARY - REFERRING   PROCEDURE:  1. Lower extremity angiogram.  2. Renal angiogram.  3. PTA to the right renal artery.  4. PTCA to one vessel.   HOSPITAL COURSE:  [name] is a 77 year old female with a history of  coronary artery disease as well as peripheral vascular disease.  She was  evaluated by Dr. Salvadore Farber and had cutting balloon angioplasty of  her right renal artery for end-stent restenosis in June of 2004.  Ultrasound  raised concern for recurrent end-stent restenosis and she was admitted for  angiography and possible restenting.   Angiography showed recurrent end-stent restenosis in the right renal artery  and she had a stent placed to the right renal artery as well as angiocele.  She was placed on Plavix through June of 2005 and aspirin indefinitely.  She  is to a get a renal ultrasound in three months and follow up with Dr.  Salvadore Farber in three to four weeks.   The next day, her platelet count was 189,000, her creatinine was 1.1, and  her hematocrit was 26.  Her groin was stable and she had no abdominal or  flank pain.  Her lungs were clear.  Dr. Salvadore Farber felt that she was  doing well status post renal stent and blood pressure was under good  control.  She was considered stable for discharge on June 29, 2003.  She  is to stop her hydralazine and continue the Lotensin.   LABORATORY DATA:  Hemoglobin 8.7, hematocrit 26.3, WBC 5.6, platelets  189,000.  Sodium 138, potassium 4.4, chloride 109, CO2 25, BUN 33,  creatinine 1.1, glucose 96.   CONDITION ON  DISCHARGE:  Improved.   DISCHARGE DIAGNOSES:  1. Peripheral vascular disease, status post percutaneous transluminal     coronary angioplasty and stent for end-stent restenosis in the right     renal artery.  2. Status post aortocoronary bypass surgery in 1988.  3. Status post cardiac catheterization in June of 2004 with an EF of 60% and     pulmonary hypertension, as well as stent of the saphenous vein graft to     obtuse marginal.  4. Hypertension.  5. History of congestive heart failure.  6. Irritable bowel syndrome.  7. History of dyslipidemia.  8. Allergy to penicillin.  9. Family history of coronary artery disease.  10.      Anemia.   DISCHARGE INSTRUCTIONS:  Her activity level is to include no strenuous  activity for two days.  She is to  return for a visit with Dr. Salvadore Farber on July 27, 2003 at 10:30.  She is to follow up with her primary  care physician as needed or as scheduled.   DISCHARGE MEDICATIONS:  1. Aspirin 325 mg q.d.  2. Plavix 75 mg q.d.  3. Lotensin 20 mg q.d.  4. Zocor 40 mg q.d.  5. Lasix 40 mg q.d.  6. Nitrostat p.r.n.      Lavella Hammock, P.A. [facility]                  Salvadore Farber, M.D.    RG/MEDQ  D:  06/29/2003  T:  06/29/2003  Job:  [id number]   cc:   Gypsy Balsam  [address] [place]  [place] [address]  Fax: [phone]   Salvadore Farber, M.D.   [facility]  [address] [place]  [place] [address]  Fax: [phone]

## 2011-01-11 NOTE — Discharge Summary (Signed)
[facility]  Patient:    [name], [name]                  MRN: [id number] Adm. Date:  [id number] Disc. Date: [id number] Attending:  Lenoria Farrier Dictator:   Delton See, P.A. CC:         Bethann Punches, M.D.   Discharge Summary  DATE OF BIRTH:  [birth year]  HISTORY OF PRESENT ILLNESS:  Ms. Boulet is a pleasant 77 year old female, patient of Dr. Raylene Miyamoto who presented to the office on March 19, 2000, for evaluation of progressive right shoulder and upper extremity discomfort. She has a previous history of coronary artery disease with stenting of the RCA in the past.  She is also status post coronary artery bypass graft surgery in 1998 performed by Dr. Particia Lather.  The patient has seen Dr. Anne Hahn in the past for her right shoulder discomfort. It was felt that she might have a neurologic etiology of this pain.  Spine films at that time revealed cervical spondylosis of C5 and C6 with some bilateral neural foramina narrowing.  Since that time, the patient has continued to have symptoms and it was felt that possibly her shoulder discomfort represented ischemia.  Arrangements were made to admit the patient to [facility] for a cardiac catheterization to further evaluate her symptoms.  PAST MEDICAL HISTORY:  As noted she had coronary artery bypass graft surgery x 3 in 1998, grafts not available at this time.  She has a history of inflammatory bowel disease.  She has carotid artery stenosis with a 40 to 60% left internal carotid artery from January of 2001.  She is status post hysterectomy.  She has a history of hypertension, elevated lipids, and DJD.  ALLERGIES:  PENICILLIN.  SOCIAL HISTORY:  She is married.  She uses alcohol occasionally.  She has not smoked in 30 to 40 years.  HOSPITAL COURSE:  As noted this patient was admitted to [facility] for cardiac catheterization to further evaluate  her coronary artery disease.  On the day of admission, she underwent cardiac catheterization performed by Dr. Charlies Constable.  The LAD had an 80% proximal lesion.  The circumflex was totally occluded.  The RCA had a 70% instent stenosis.  There was a saphenuos vein graft to the diagonal and PL which was okay, but there was a 70% distal lesion in the PL.  The LIMA to the LAD was atrophic.  The LAD had an 80% lesion and stenting was performed reducing the lesion from 80% to 0%.  The patient tolerated this well and arrangements were made to discharge her the following day in improved condition.  She did have mildly elevated enzymes with a CK of 164, MB of 7.3, index 4.5.  She was mildly anemic with hemoglobin 10.5 and hematocrit 31.4 on the day of discharge.  LABORATORY DATA:  As noted on the day of discharge, hemoglobin was 10.5, hematocrit 31.4.  On admission hemoglobin was 12.2, hematocrit 34.9.  On August 17, BUN 14, creatinine 0.9, potassium 3.7, sodium 128.  CK-MBs on April 11, 2000, revealed a CK of 164, MB 7.3, index 4.5.  A chest x-ray showed mild cardiomegaly, but no active acute disease.  DISCHARGE MEDICATIONS:  1. Verapamil 240 mg daily.  2. Lipitor 10 mg daily.  3. Premarin 0.625 mg daily.  4. Lasix 20 mg daily.  5. Folic acid daily.  6. Celebrex 200 mg twice each  day.  7. Hyzaar 100/25 one daily.  8. Klonopin 0.5 mg daily.  9. Imdur 60 mg daily. 10. Vitamin D daily. 11. Nitroglycerin p.r.n. for chest pain. 12. Plavix 75 mg each day for four weeks. 13. Darvocet per family request one q.6h. as needed for pain #10 with no     refills.  The patient was told to avoid any strenuous activity or driving for two days. She was to be on a low salt, low fat diet.  She was told to call the office if she had any increased pain, swelling, or bleeding from her groin.  She was to see Dr. Charlies Constable on September 5, at 3 p.m., Dr. Bethann Punches as needed or as scheduled.  DISCHARGE  DIAGNOSES:  1. Coronary artery disease, status post percutaneous transluminal coronary     angioplasty stenting of the left anterior descending this admission with     ejection fraction estimated to be 60%.  2. Previous coronary artery bypass graft surgery in 1998 with a left internal     mammary artery to the left anterior descending, sequential saphenuos vein     graft to the diagonal and posterolateral.  3. Hyponatremia.  4. History of cervical spondylosis.  5. History of left internal carotid artery stenosis of 40 to 60%.  6. Status post hysterectomy.  7. History of inflammatory bowel disease.  8. History of hypertension.  9. History of elevated lipids. 10. Degenerative joint disease. 11. Abdominal bruit noted at last office visit. DD:  04/11/00 TD:  04/11/00 Job: [id number]

## 2011-01-11 NOTE — Discharge Summary (Signed)
NAME:  [name], [name]                     ACCOUNT NO.:  [id number]   MEDICAL RECORD NO.:  [id number]                   PATIENT TYPE:  INP   LOCATION:  2030                                 FACILITY:  [facility]   PHYSICIAN:  Pricilla Riffle, M.D.                 DATE OF BIRTH:  Jan 07, 1923   DATE OF ADMISSION:  02/10/2003  DATE OF DISCHARGE:  02/22/2003                           DISCHARGE SUMMARY - REFERRING   PROCEDURES:  1. Coronary angiogram/stent ([facility])  2. SVG-obtuse marginal graft, June 21st.  3. Right renal artery cutting balloon PTCA, June 23rd.  4. 2-D echocardiogram, June 18th.  5. Renal ultrasound, June 23rd.   REASON FOR ADMISSION:  The patient is a 77 year old female, status post  previous CABG in 1988, with subsequent PCIs, and known normal left  ventricular function, who initially presented to the emergency room at  [facility] for evaluation of extreme weakness and noted bradycardia,  after initially presenting for routine blood work.  The patient also  reported chest pain, and following stabilization, arrangements were made for  transfer to [facility].  Please refer to dictated admission note  for full details.   LABORATORY DATA:  At discharge, sodium 128, potassium 4.7, glucose 96, BUN  33, creatinine 1.2, WBC 7.7, HGB 9.0, HCT 26.7 (MCV 78.9), platelet 274.  INR 2.1 on admission.  BUN 50.  Creatinine 1.6 on admission.  AST, amylase,  and lipase initially mildly elevated.  Serial cardiac markers normal.  Admission BNT 254.  Lipid profile:  Total cholesterol 58, triglyceride 119,  HDL 12, LDL 22.  Urine culture:  Greater than 10 to the 5th E-coli.   HOSPITAL COURSE:  Following transfer from [facility], the  patient was maintained on medication regimen including intravenous  nitroglycerine, heparin, and Integrelin for management of unstable angina  pectoris.  She apparently was recently hospitalized at [facility] on  June 2nd  of this year and found to have a small myocardial infarction.  Plans were therefore to proceed with diagnostic angiography this admission.   All serial cardiac markers were within normal limits.   The patient had also been on Coumadin prior to admission, and following  subtherapeutic INR, the patient was cleared to proceed with coronary  angiogram.  Additional medication adjustments included holding beta blocker  due to bradycardia.  The patient was also treated by holding diuretics and  adding Mucomyst.   Prior to catheterization, a 2-D echocardiogram revealed moderate LV  dysfunction (35 to 45%) with evidence suggestive of constrictive  pericarditis (see echo report for full details).  Moderate/severe MR as well  as reduced femoral artery function was also noted.   Coronary angiogram, performed June 21st by Dr. Charlies Constable (see report for  full details), revealed severe native femoral CAD with total occlusion of  the LAD and first obtuse marginal.  Noncritical disease of the RCA, however,  was noted with widely patent  stent in the RCA.  Regarding the grafts, there  was a high grade, 90% distal SVG-OM lesion which was successfully treated  with a TITUS stent to less than 10% residual stenosis.  There was also 70%  diffuse distal OMA-LAD disease with an 80% lesion at the anastomosis site.   Dr. Juanda Chance also noted that the patient developed thrombus in the stent site  which was treated with EXPORT and bail-out Integrelin.   Of note, estimated ejection fraction during coronary angiography was 60% (as  opposed to the noted echocardiogram findings).   Distal aortogram was also performed revealing 60% left renal artery and 95%  eccentric stenosis of the right renal artery.   Recommendation was to have patient return for percutaneous intervention of  the right renal stent lesion.  The patient was seen in consultation by Dr.  Samule Ohm.  She was able to proceed with successful cutting  balloon PTCA of  the lesion to 20% residual stenosis with no noted complications.   The patient's hospital course was complicated by development of left groin  pseudoaneurysm, documented by ultrasonography.  This was successfully  compressed following injection of thrombin.   The patient also was treated for urinary tract infection, with an initial  three day course, but required further treatment with recommendations to  continue for an additional week following discharge.   Hyponatremia was noted (peak low at 124), with a level of 128 at time of  discharge.  Continued fluid restriction was recommended.   Additional medication adjustments consisted of addition of Ticlid, giving a  reported allergy to PLAVIX     in the past.  Dr. Samule Ohm recommended  continuing this for six months, giving placement of the drug-eluting stent  in the SVG graft.   Dr. Juanda Chance also felt that the patient was not suitable for long term  Coumadin anticoagulation, she did have history of paroxysmal atrial  fibrillation, but was maintained in normal sinus rhythm during her hospital  stay.  She was also taken off atenolol given to significant bradycardia and  two second pauses.   The patient was cleared for discharge on hospital day #12, in  hemodynamically stable condition.   MEDICATIONS AT DISCHARGE:  1. Lotensin 20 mg daily.  2. Apresoline 25 mg t.i.d.  3. Ticlid 250 mg b.i.d. (continue for six months).  4. Zocor 40 mg q.h.s.  5. Ciprofloxacin 250 mg b.i.d. (x7 days).  6. Lasix 40 mg q.d.  7. Coated aspirin 325 mg q.d.  8. Percocet 100 two tablets q. 4-6h p.r.n.  9. Supplemental iron as needed.  10.      Nitrostat 0.4 mg as needed.   INSTRUCTIONS:  1. The patient is instructed to maintain low fat/low cholesterol diet and     fluid restriction of approximately 1500 cc per day. 2. The patient is instructed to have a followup metabolic profile in one     week for continued monitoring of low sodium.   3. The patient is scheduled for followup renal ultrasound with [facility] on Wednesday, July 28th, at 10 a.m.  4. The patient is scheduled to follow up with Dr. Randa Evens, for     continued monitoring of renal artery stenosis on Monday, September 6th,     at 11:15 a.m.  5. The patient is instructed to follow up with her primary cardiologist, Dr.     Mirian Capuchin, in approximately two weeks.  6. The patient is instructed to follow up  with her primary care physician,     Dr. Bethann Punches, as needed.   DISCHARGE DIAGNOSES:  1. Severe coronary artery disease/recent non ST elevation myocardial     infarction.     a. Status post stent (TAXUS) SVG-obtuse marginal on June 21st.     b. Preserved left ventricular function (by coronary angiography).     c. Status post CABG, 1988.  2. Congestive heart failure.  3. Valvular heart disease.     a. Moderately severe mitral regurgitation.     b. Severe tricuspid regurgitation.  4. Renal vascular disease.     a. Status post cutting balloon PTA of right renal artery (secondary to        in-stent restenosis).  5. Left femoral artery pseudoaneurysm.  6. History of paroxysmal atrial fibrillation/sick sinus syndrome.     a. Documented two second pauses-atenolol discontinued.     b. Not suitable for long term Coumadin anticoagulation.  7. Chronic renal insufficiency.  8. Hyponatremia.  9. Urinary tract infection.  10.      Dyslipidemia.  11.      Macrocytic anemia.     Gene Serpe, P.A. [facility]                      Pricilla Riffle, M.D.    GS/MEDQ  D:  02/22/2003  T:  02/22/2003  Job:  [id number]   cc:   Dr. Mirian Capuchin  [facility]  [address] [place]  [place] [address]  Fax: [phone]   Lorre Munroe., M.D.  Fax: [phone]    cc:   Dr. Mirian Capuchin  [facility]  [address] [place]  [place] [address]  Fax: [phone]   Lorre Munroe., M.D.  Fax: [phone]

## 2011-01-11 NOTE — Cardiovascular Report (Signed)
[place]  Patient:    [name]                   MRN: [id number] Proc. Date: 09/07/99 Adm. Date:  [id number] Attending:  Lenoria Farrier CC:         Bethann Punches, M.D., [place], [place]             Bruce R. Juanda Chance, M.D. [facility]             Mariel Kansky, M.D., [place], [place]             Cardiac Catheterization Laboratory                        Cardiac Catheterization  INDICATIONS:  [name] is 77 years old and had coronary artery bypass surgery performed in 1988.  She was recently admitted to [facility] with chest pain and enzymes changes with a non-Q wave infarction.  Her EKG did not really localize the infarction.  She was transferred for further evaluation with catheterization and possible intervention.  DESCRIPTION OF PROCEDURE:  The procedure was performed via the right femoral artery using an arterial sheath and 6-French preformed coronary catheters.  A front wall arterial puncture was performed and Omnipaque contrast was initially used. After it was recognized that there was a right stenosis in the right coronary artery s well LAD disease and after reviewing the films with Dr. Riley Kill, we made the decision to proceed with intervention on the right coronary artery.  The patient was given weight adjusted heparin to prolong the ACT to greater than 200 seconds.  She was given double bolus Integrilin and infusion.  We switched o Hexabrix contrast.  We used a JR-4 guiding catheter and a short floppy wire and  were able to cross the lesion in the distal right coronary artery without too much difficulty.  We went in with a 2.5 x 20 mm CrossSail balloon and performed two inflations of 6 atm and 8 atm for 20 seconds and 21 seconds.  We then deployed  3.0 x 18 mm AVE [id number] stent with one inflation of 10 atm x 35 seconds.  The mid portion of the stent did not completely expand and we did not want to oversize he distal edges  of the stent so we exchanged for a 3.0 x 50 mm Quantum Ranger and ost dilated this with two inflations of 17 atm and 19 atm for 37 seconds and 26 seconds.  Repeat diagnostics were then performed through the guiding catheter. The patient tolerated the procedure well and left the laboratory in satisfactory condition.  We closed the right femoral artery with Perclose at the end of the procedure.  RESULTS:  CORONARY ANGIOGRAPHY: 1. The left main coronary artery was free of significant disease.  2. The left anterior descending artery had a 40% proximal stenosis and an 80%    proximal to mid stenosis with some competing flow in this area which appeared    to be most in the septal perforator.  The distal vessel then appeared to fill    better and was a fairly good caliber vessel that was free of significant    disease.  The distal vessel gave rise to two more septal perforators, but no    diagonal branches.  3. The circumflex artery gave rise to a small marginal branch, a larger marginal    branch, and then was completely occluded.  There was 40% narrowing in the    proximal circumflex artery.  4. The right coronary artery was a moderate sized vessel that gave rise to a right    posterior descending branch and two posterolateral branches.  There was 50%    proximal, 50% mid, and 90% distal stenosis in the right coronary artery. The    distal vessels were free of significant disease.  5. The LIMA graft was atrophic and the LAD did not really visualize through the  subselective LIMA injection.  LEFT VENTRICULOGRAM:  The left ventriculogram was performed in the RAO projection and showed akinesis of the tip of the apex.  The overall wall motion was good with an estimated ejection fraction of 55%.  The left ventriculogram performed in the LAO projection showed akinesis of the ip of the apex.  VEIN GRAFT:  The saphenous vein graft to the diagonal branch of the LAD and  the  distal circumflex artery was patent and functioned normally.  There was 50% narrowing distal to the insertion site.  Following stenting of the distal right coronary artery stenosis, the stenosis improved from 90% to 0%.  There was no dissection seen.  HEMODYNAMIC DATA:  The aortic pressure was 183/72 with a mean of 113.  The left  ventricular pressure was 183/25.  CONCLUSIONS: 1. Coronary artery disease status post coronary artery bypass graft surgery in  1988.  The native circulation shows 80% stenosis in the proximal left anterior    descending artery with total occlusion of a diagonal branch, total occlusion of    the distal circumflex artery, and 90% stenosis in the distal right coronary    artery.  The grafts show a patent sequential vein graft to the diagonal branch    of the left anterior descending artery and distal circumflex artery with 50%    stenosis distal to the anastomosis and an atrophic left internal mammary artery    graft to the left anterior descending artery.  Left ventricular function shows    apical wall akinesis. 2. Successful stent deployment in the distal right coronary artery with improvement    in the percent of diameter narrowing from 90% to 0%.  DISPOSITION:  It is not 100% clear what the culprit vessel is.  The EKG does not localize the infarct and the MB was only 20.  The left ventriculogram shows apical akinesis, but it is not certain whether this is a postoperative finding or a new finding, although this was not present by Dr. Julien Nordmann preoperative report.  We think it is most likely that the right coronary artery is the culprit vessel we  felt that this needed to be treated in any event and so elected to treat the right coronary artery and will plan to evaluate the LAD with a stress Cardiolite scan as an outpatient. DD:  09/07/99 TD:  09/07/99 Job: [id number]

## 2011-01-11 NOTE — Consult Note (Signed)
NAME:  [name], [name]                     ACCOUNT NO.:  [id number]   MEDICAL RECORD NO.:  [id number]                   PATIENT TYPE:  INP   LOCATION:  2928                                 FACILITY:  [facility]   PHYSICIAN:  Salvadore Farber, M.D.             DATE OF BIRTH:  07-08-1923   DATE OF CONSULTATION:  DATE OF DISCHARGE:                                   CONSULTATION   REQUESTING PHYSICIAN:  Charlies Constable, M.D.   CONSULTING PHYSICIAN:  Salvadore Farber, M.D.   REASON FOR CONSULTATION:  Renal artery restenosis.   HISTORY OF PRESENT ILLNESS:  I am asked by Dr. Juanda Chance to assist in the  management of renal artery stenosis.  This is a 77 year old lady with  coronary and peripheral vascular disease.  She recently presently to  [facility] with unstable angina.  This was initially treated  medically, but subsequently presented with bradycardia eventually prompting  percutaneous coronary intervention performed by Dr. Juanda Chance yesterday.   In November of 2003, the patient underwent renal artery stenting by Dr.  Beverlyn Roux.  Initially, her blood pressure was very well controlled, prompting  removal of multiple medications.  However, over the past several months,  blood pressure has markedly increased, now requiring three medications to  control it.  She had a cardiac catheterization performed yesterday.  She was  found to have 95% in-stent restenosis of the right renal and approximately  50% left renal artery stenosis.   PAST MEDICAL HISTORY:  1. Coronary artery disease, status post coronary artery bypass grafting and     subsequent percutaneous intervention.  EF 60%.  2. Hypertension.  3. History of congestive heart failure.  4. History of irritable bowel syndrome.  5. Dyslipidemia.   ALLERGIES:  Plavix causes a rash, and penicillin.   CURRENT MEDICATIONS:  1. Aspirin 325 mg per day.  2. Simvastatin 40 mg per day.  3. Premarin 0.625 mg per day.  4. Lotensin 20 mg twice  per day.  5. Pepcid 20 mg twice per day.  6. Hydralazine 25 mg four times per day.  7. Ticlid 250 mg twice per day.   SOCIAL HISTORY:  The patient is married and lives in [place] with her  husband.  He is a retired Teacher, early years/pre.  Her daughter is a Teacher, early years/pre as well.  Denies tobacco and alcohol use.   FAMILY HISTORY:  Father died of an myocardial infarction at age 82.  She has  siblings with coronary artery disease.   REVIEW OF SYSTEMS:  Negative in detail except as above.   PHYSICAL EXAMINATION:  Generally well-appearing woman in no distress with  heart rate 72, blood pressure 126/42, oxygen saturation 95% on room air.  She had no jugular venous distension.  The lungs are clear to auscultation.  She has a regular rate and rhythm with 2/6 systolic ejection murmur at the  left upper sternal border.  There was no S3.  The  abdomen is soft,  nondistended and nontender.  There are normal bowel sounds.  There is no  hepatosplenomegaly.  Extremities are warm without edema.  Carotid pulses are  2+ bilaterally with bilateral bruits.  Femoral pulses are 2+ bilaterally  without bruits.  Bilateral DT pulses are 2+.  Right groin is tender without  hematoma.   LABORATORY DATA:  Laboratory studies are remarkable for hematocrit of 29,  platelets 209, creatinine 1.3, glucose 103, potassium 4.1.   IMPRESSION/RECOMMENDATIONS:  The patient has severe, recurrent right renal  artery stenosis resulting in hypertension now requiring three medications  for control.  This severe and rapidly progressive restenosis poses  substantial risk of renal atrophy.  We will therefore check renal ultrasound  to exclude atrophy presently.  If the size of the right kidney is relatively  preserved, we will then proceed to revascularization tomorrow.  The risk  including recurrent restenosis and worsening renal function we explained in  detail to the patient and her husband.  They agree with the plan as outlined   above.                                               Salvadore Farber, M.D.    WED/MEDQ  D:  02/15/2003  T:  02/16/2003  Job:  [id number]   cc:   Charlies Constable, M.D.   Theodoro Kalata, MD   Dr. Beverlyn Roux

## 2011-01-11 NOTE — Cardiovascular Report (Signed)
[place]. [facility]  Patient:    [name], [name]                  MRN: [id number] Proc. Date: 12/25/00 Adm. Date:  [id number] Disc. Date: [id number] Attending:  Lenoria Farrier CC:         Bethann Punches, M.D., [facility], [facility]  Cardiopulmonary Lab   Cardiac Catheterization  PROCEDURE:   Right and left heart catheterization.  CARDIOLOGISTEverardo Beals Juanda Chance, M.D. [facility]  INDICATIONS:  [name] is 77 years old and has had previous bypass surgery and has had stenting of the right coronary artery and has had a complex intervention with a long stent placed in the LAD in August 2001.  Recently she developed recurrent chest pain and symptoms of congestive heart failure.  She had a Cardiolite scan that showed anterior ischemia, and we delayed catheterization because of the difficulty with the previous intervention. Because of persistent symptoms, we made decision to proceed with further evaluation with angiography.  PROCEDURE IN DETAIL:  The procedure was performed via the right femoral artery using arterial sheath and 6-French preformed coronary catheters.  A frontal arterial puncture was performed, and Omnipaque contrast was used.  Right heart catheterization was performed percutaneously via the right femoral vein using a venous sheath and Swan-Ganz thermodilution catheter.  A LIMA catheter was used for injection of the LIMA graft.  The patient tolerated the procedure well and left the laboratory in satisfactory condition.  RESULTS:  HEMODYNAMIC DATA:  The right atrial pressure was 9 mean.  The right ventricular pressure was 48/11.  The pulmonary artery pressure was 48/16 with a mean of 34.  Pulmonary wedge pressure ranged from 17 to 25.  Left ventricular pressure was 153/18. Aortic pressure was 153/69 with a mean of 112.  Cardiac output/index by Fick was 3.7/2.1 liters/minutes/meters squared.  ANGIOGRAPHIC DATA:  Left main coronary  artery was free of significant disease.  Left anterior descending artery had diffuse in-stent restenosis from the proximal to the mid LAD with 95% narrowing all along the length of the two overlapping stents.  There was only a trickle of blood flow (TIMI-1) into the distal LAD.  The circumflex artery was a moderate size vessel that had diffuse irregularity.  The circumflex gave rise to a small marginal branch, a larger marginal branch, and then was completely occluded before a posterolateral branch.  There was 70% narrowing in the proximal circumflex artery.  The right coronary artery was a moderate size vessel that gave rise to two right ventricular branches, a posterior descending branch, and two posterolateral branches.  There was 50-70% stenosis in the proximal right coronary artery 50% narrowing in the mid right coronary artery, and 50% narrowing within the stent in the mid to distal right coronary artery.  There was also 70% narrowing at the ostium of the posterior descending branch.  The saphenous vein graft to the diagonal branch of the LAD and posterolateral branch of the circumflex artery was patent and functioned well, but there was 70-80% narrowing in the posterolateral branch just after the anastomosis.  The LIMA graft to the LAD was atrophic and just filled the distal LAD with a trickle of blood (TIMI-1 flow).  The left ventriculogram performed in the RAO projection showed akinesis of the tip of the apex.  The overall wall motion was good with an estimated ejection fraction was 60%.  CONCLUSION:  Coronary artery disease status post prior bypass surgery and status post  stenting of the right coronary artery and stenting of the left anterior descending artery with diffuse, long in-stent restenosis of 95% in the left anterior descending artery, 70% narrowing in the proximal circumflex artery with total occlusion of a posterolateral branch, and 50 to 70% proximal stenosis  in the right coronary artery with 50% stenosis within the stent in the mid to distal vessel and 70% stenosis in the ostium of the posterior descending branch with a patent vein graft to the diagonal branch of the left anterior descending artery and posterolateral branch of the circumflex artery with 70-80% stenosis in the posterolateral branch after the anastomosis, and atrophic left internal mammary artery graft to the left anterior descending artery and apical wall akinesis.  RECOMMENDATIONS:  The patient has developed diffuse in-stent restenosis in the LAD.  The lesion looks very unfavorable for repeat percutaneous intervention. She has moderate disease in the posterolateral branch and in the right coronary artery but probably not flow limiting.  I would recommend continued medical therapy.  If she has symptoms that are refractory to medical therapy, we might consider redo bypass surgery. DD:  12/25/00 TD:  12/27/00 Job: [id number]

## 2011-01-11 NOTE — Discharge Summary (Signed)
[facility]  Patient:    [name]                   MRN: [id number] Adm. Date:  [id number] Disc. Date: 09/09/99 Attending:  Lenoria Farrier Dictator:   Abelino Derrick, P.A.C. [facility] CC:         Bruce R. Juanda Chance, M.D. [facility] (office)             Dr. Hyacinth Meeker, [place], [place]                           Discharge Summary  DISCHARGE DIAGNOSES: 1. Coronary disease, status post subendocardial myocardial infarction this    admission treated with right coronary artery stents. 2. Past history of coronary disease, status post coronary artery bypass grafting    in 1988. 3. Preserved left ventricular function. 4. Renal insufficiency. 5. Hypertension. 6. Hyperlipidemia. 7. History of irritable bowel syndrome. 8. Peripheral vascular disease with 40 to 60% left internal carotid stenosis. 9. Right groin hematoma and ecchymosis at discharge.  HOSPITAL COURSE:  The patient is a 77 year old female with history of coronary disease.  She had bypass surgery x 3 in 1988 with LIMA to LAD, SVG to the diagonal and OM-1.  She had a recent evaluation in  for shortness of breath. he had a negative Cardiolite.  She was put on some diuretics and developed some dehydration.  She was admitted to [facility] September 04, 1999 with shortness of breath and chest pain.  CKs were positive at 60 and 63 and 20 MBs. Troponin was 11.7. he has had an echocardiogram in the past that showed good LV function with moderate MR in 1994.  She did have elevated creatinine initially on admission with a BUN 47, creatinine 1.9, although this improved with hydration and holding her diuretics. She was transferred to [facility] for further evaluation.  The patient was admitted to [facility] telemetry and set up for cardiac catheterization.  She did have carotid artery bruits on exam and a Doppler study showed a 40 to 60% left internal carotid artery stenosis.  A 2-D echocardiogram showed  normal LV function, moderate MR, mild aortic sclerosis.  Cardiac catheterization was done by Dr. Juanda Chance on September 07, 1999 nd this revealed 90% mid RCA that was dilated and stented.  She had a patent SVG to the circumflex and diagonal and an atretic LIMA to LAD with an 80% native LAD lesion.  Her EF was 55%.  She tolerated this well.  She did have some ecchymosis of her right groin postoperatively.  This is stable at discharge.  It should also e noted that she did have transient junctional rhythm and hypotension with beta blocker at [facility] and this was held.  DISCHARGE MEDICATIONS:  1. Plavix 75 mg a day for four weeks.  2. Levsin 0.125 b.i.d.  3. Calan SR 180 q.d.  4. Lasix 20 mg q.d.  5. Premarin 0.625 q.d.  6. Aspirin q.d.  7. Detrol 0.5 b.i.d.  8. Imdur 60 mg a day.  9. Clonopin 0.5 q.d. 10. Lipitor 10 mg q.d. 11. Nitroglycerin sublingual p.r.n.  LABORATORY DATA:  White count is 10.8, hemoglobin 11.7, hematocrit 33.8, platelets 268.  CKs were negative here.  Urine is unremarkable.  Sodium 137, potassium 3.9, BUN 14, creatinine 0.7.  Chest x-ray shows no evidence of acute disease.  EKG shows normal sinus rhythm with PVCs.  No acute changes.  DISPOSITION:  The patient is discharged in stable condition and will follow up ith Dr. Juanda Chance.  Dr. Juanda Chance suggests she get an outpatient Cardiolite study to assess her LAD lesion. DD:  09/09/99 TD:  09/09/99 Job: [id number]

## 2011-01-11 NOTE — H&P (Signed)
NAME:  [name], [name]                     ACCOUNT NO.:  [id number]   MEDICAL RECORD NO.:  [id number]                   PATIENT TYPE:  INP   LOCATION:  2113                                 FACILITY:  [facility]   PHYSICIAN:  Pricilla Riffle, M.D.                 DATE OF BIRTH:  1923-04-09   DATE OF ADMISSION:  02/10/2003  DATE OF DISCHARGE:                                HISTORY & PHYSICAL   HISTORY OF PRESENT ILLNESS:  [name] is a 77 year old woman with a  history of known CAD, status post CABG, question year.  Last cardiac  catheterization back in May, 2002, showed a 70% left circumflex artery  lesion, end-stent restenosis with a mural LAD, 100% POSA lesion, 50%  proximal RCA, 50% end-stent RCA stenosis, 70% PDA lesion.  SVG to diagonal  was patent, SVG to POSA had an 80% disease in the vessel distal to the graft  and the LIMA to the LAD was noted to be atretic.  EF of 60%.   The patient presented to [facility] on June 2, with chest  pressure, ruled in for a myocardial infarction with a peak CK of 300 by  report, MB of 11.6, troponin less than 0.1.  Question if this was the peak.  Discussion was made for angiography, however, no surgical backup is  available.  The patient was discharged and set for followup with [name], M.D. for possible intervention.   Since discharge, the patient has had intermittent episodes of chest  pressure.  She presented to [facility] today for routine laboratory  evaluation, became extremely weak before the blood work was done with heart  rates into the 20's.  She was given Atropine (? dose) with improvement.  She  was feeling funny, flushed, presyncopal.  Also, complained of chest  pressure.  She was transferred here for low heart rates, for evaluation of  her CAD.  She continued to report chest pressure, having a shelf of  complaints of midabdominal pain.  Pressure has improved.  Question was  increased heart rate.   ALLERGIES:  PLAVIX creating a rash, PENICILLIN.   MEDICATIONS:  1. Coumadin 1.5.  2. Aspirin 81.  3. Tylenol 100 b.i.d.  4. Avapro 300.  5. Demadex 20 q.o.d.  6. Lotensin 20 daily.  7. Premarin 0.65 daily.  8. Quinine p.r.n.   PAST MEDICAL HISTORY:  1. Coronary artery disease, status post coronary artery bypass graft.  2. Hypertension.  3. History of congestive heart failure.  4. History of irritable bowel syndrome.  5. History of dyslipidemia.  Note:  Patient not on antihyperlipidemic by     current list.   SOCIAL HISTORY:  The patient is married, lives in [place].  Does not  smoke, does not drink.   FAMILY HISTORY:  Mother died; father died of an MI at age 58; siblings with  CAD.   REVIEW OF SYSTEMS:  Overall have reviewed.  Negative with the above problems  except as noted.   PHYSICAL EXAMINATION:  GENERAL:  The patient is in mild distress secondary  to discomfort.  Note:  She was under some discomfort leading to moaning with  EKG rates being taken up.  VITAL SIGNS:  Blood pressure is in the 130's.  Heart rate from 30's to 60's  (junctional rhythm to sinus rhythm with frequent PVC's, unifocal).  Respiratory rate is 20.  NECK:  JVP is normal, no bruits.  LUNGS:  Relatively clear.  CARDIAC:  Regular rate and rhythm, normal S1, S2, grade 1, 2/6 systolic  murmur.  No S3 or S4.  ABDOMEN:  Mild tenderness in the midregion, no rebound, positive bowel  sounds.  EXTREMITIES:  2+ distal pulses, no edema.   STUDIES:  A 12-lead EKG shows normal sinus rhythm at a rate of 67 beats/min.  with frequent PVC's.  No acute ST changes.   LABORATORY DATA:  Significant for a hemoglobin of 10.  BUN and creatinine 58  and 1.8, potassium of 5.2.  INR of 2.   IMPRESSION:  [name] is a 77 year old woman with a history of CAD, had a  (small by report) myocardial infarction a few weeks ago with known CABG and  stent restenosis problems.  Most likely, will need a repeat cardiac   catheterization.  Unfortunately, her creatinine is above the baseline.  Optimally, one would be able to treat this with hydration and medical  therapy intervening.  I would go ahead and check CK, troponin and begin  antiplatelet agents.  Note:  Heart rate is improving.  May not need  temporary pacemaking wire.  Will follow and hold beta blockade for now.                                                  Pricilla Riffle, M.D.    PVR/MEDQ  D:  02/10/2003  T:  02/11/2003  Job:  [id number]

## 2011-01-11 NOTE — Cardiovascular Report (Signed)
NAME:  [name], [name]                     ACCOUNT NO.:  [id number]   MEDICAL RECORD NO.:  [id number]                   PATIENT TYPE:  INP   LOCATION:  3707                                 FACILITY:  [facility]   PHYSICIAN:  Charlies Constable, M.D.                  DATE OF BIRTH:  09-29-1922   DATE OF PROCEDURE:  02/14/2003  DATE OF DISCHARGE:                              CARDIAC CATHETERIZATION   CLINICAL HISTORY:  The patient is 77 years old and had bypass surgery in  1988 by Dr. Andrey Campanile.  She subsequently had stenting of the right coronary  artery and stenting of the LAD and subsequently occluded her LAD.  She had  an atrophic internal mammary artery to the LAD.  Her last catheterization  was performed in May of 2002 at which time she had an occluded LAD with an  atretic LIMA that we elected to treat medically.  She recently developed  recurrent chest pain and was hospitalized at [facility] and  transferred to [place] for further evaluation.   PROCEDURE:  The procedure was performed via the right femoral artery  utilizing an arterial sheath and 6-French preformed coronary catheters.  A  front wall arterial puncture was performed and Omnipaque contrast was used.  Right heart catheterization was performed percutaneously through the right  femoral vein using a medium sheath and Swan-Ganz thermodilution catheter.  After completion of the diagnostic study, made a decision to proceed with  intervention on the saphenous vein graft to the marginal branch and  circumflex artery at its anastomosis.  The patient was given Ticlid 500 mg  since she was allergic to Plavix.  She was given Angiomax bolus and  infusion.  We used an AL1 6-French guiding catheter with side holes and a  short floppy wire.  We crossed the lesion in the graft to the circumflex  artery without difficulty.  We predilated with a 2.5 x 15 mm Quantum  Maverick performing three inflations up to 6 atmospheres for 30 seconds.  This resulted in slow flow down the artery which responded to intracoronary  verapamil.  We then stented the lesion with a 2.5 x 20 mm Taxus stent  deploying this with one inflation at 10 atmospheres for 30 seconds.  Repeat  diagnostic study was then performed through the guiding catheter.  Once  again there was slow flow and this normalized with TIMI 3 flow with  verapamil.  We had a filling defect within the stent and so we elected to  use the export catheter.  We passed this through the stent and performed  aspiration and thrombectomy.  We removed a moderate amount of atheromatous  material with some small amount of thrombus.  There was still a residual  defect within the stent so we went in with a 2.75 x 12 mm Quantum Maverick,  performed two inflations up to 12 atmospheres for 30 seconds.  Repeat  diagnostic  study was then performed through the guiding catheter.  The  patient tolerated the procedure well and left the laboratory in satisfactory  condition.   RESULTS:  Left main coronary artery:  Free of significant disease.   Left anterior descending artery:  Was diffusely diseased within the stent  down to the mid portion of the vessel where it was completely occluded.   Circumflex artery:  The circumflex artery gave rise to small marginal  branch.  The second marginal branch was completely occluded and a  posterolateral branch.  There was 40% narrowing in the posterolateral  branch.   Right coronary artery:  The right coronary artery was a moderately large  vessel, gave rise to a conus branch, two right ventricular branches, a  posterior descending branch, and a two posterolateral branches.  There was  40% narrowing in the mid vessel.  There was less than 10% narrowing at the  stent site in the mid to distal vessel.  There was 70% narrowing at the  ostium of the posterior descending artery.   The saphenous vein graft to the diagonal branch of the LAD and the marginal  branch of  the circumflex artery was patent.  There was 90% stenosis at the  anastomosis to the marginal branch which extended into the marginal branch.   The LIMA graft to the LAD was patent, but was diffusely diseased in its  distal portion.  It no longer appeared atretic.  There were several 70%  lesions in the distal portion and 80% narrowing at the anastomosis.  Distal  LAD appeared to be a fairly good caliber vessel.   LEFT VENTRICULOGRAM:  The left ventriculogram performed in the RAO  projection showed akinesis of the tip of the apex.  The overall wall motion  was stable and estimated ejection fraction was 60%.   DISTAL AORTOGRAM:  Distal aortogram was performed to look at the renal  arteries where there was previous right renal artery stenting.  There was  95% diffuse in-stent restenosis in the right renal artery.  There was 60%  stenosis in the left renal artery near the ostium.   HEMODYNAMIC DATA:  The right atrial pressure was 16 mean.   The pulmonary artery pressure was 72/29 with a mean of 50.   Pulmonary wedge pressure was 31 mean.   The aortic pressure was 188/78 with a mean of 121.   The left ventricular pressure was 188/28.   Cardiac output/cardiac index by Fick was 2.8/1.6 L/sq m.   Following stenting of the lesion at the anastomosis of the vein graft to the  marginal branch at circumflex artery stenosis improved from 90% to less than  10%.   CONCLUSIONS:  1. Coronary artery disease status post coronary artery bypass graft surgery     1988 and status post previous percutaneous interventions on the right     coronary artery and left anterior descending.  2. Severe native vessel disease with total occlusion of the left anterior     descending at its mid portion within the stent, total occlusion of the     second marginal branch to the circumflex artery, and 40% narrowing in the    posterolateral branch of the circumflex artery, 40% narrowing in the mid     right coronary  artery with less than 10% narrowing at the stent site in     the mid to distal right coronary artery and 70% narrowing in the     posterior descending branch.  3. Patent vein graft to the diagonal branch of left anterior descending and     the marginal branch of the circumflex artery with 90% stenosis at the     anastomosis to the marginal branch and a significantly diseased internal     mammary artery to the left anterior descending with multiple 70% distal     stenoses and 80% stenoses at the anastomosis.  4. Apical wall akinesis, but overall good left ventricular function with an     estimated ejection fraction of 60%.  5. Successful stenting of the lesion at the anastomosis of vein graft to     circumflex marginal vessel with improvement in center of narrowing from     90% to less than 10%.  6. Marked elevation of left ventricular filling pressures.    RECOMMENDATIONS:  At the end of the procedure we decided to use bail out  Integrilin because of the residual filling defect within the stent.  Will  plan continued medical therapy of the disease in the LIMA to the LAD since  this was very unfavorable for intervention.  I do not think lesions in the  right coronary artery are flow limiting.                                               Charlies Constable, M.D.    BB/MEDQ  D:  02/14/2003  T:  02/14/2003  Job:  [id number]  Arnoldo Hooker, M.D.  [place]   Bethann Punches, M.D.  [place]   cc:   Arnoldo Hooker, M.D.  [place]   Bethann Punches, M.D.  [employer]

## 2011-01-11 NOTE — H&P (Signed)
NAME:  [name], [name]                    ACCOUNT NO.:  [id number]   MEDICAL RECORD NO.:  [id number]                   PATIENT TYPE:  OIB   LOCATION:                                       FACILITY:  [facility]   PHYSICIAN:  Salvadore Farber, M.D.             DATE OF BIRTH:  06-May-1923   DATE OF ADMISSION:  05/03/2003  DATE OF DISCHARGE:                                HISTORY & PHYSICAL   CHIEF COMPLAINT:  Question of recurrent renal artery stenosis.   HISTORY OF PRESENT ILLNESS:  [name] is a 77 year old lady with coronary  and peripheral vascular disease.  In November, 2003, she underwent right  renal artery stenting by Dr. Beverlyn Roux.  Initially, blood pressure was very  well controlled, prompting removal of multiple medications.  However, over  the course of this Spring, her hypertension returned.  Had cardiac  catheterization performed in June of this year.  She was found to have 95%  end-stent restenosis in the right renal artery and approximately 50% left  renal artery stenosis.  On June 23rd, I performed cutting balloon  angioplasty of the right renal artery restenotic lesion, resulting in  approximately 20% residual stenosis with no dissection.  Due to a relatively  high risk of recurrent restenosis, she underwent early Doppler surveillance.  This was performed two weeks ago and is very suggestive of greater than 60%  stenosis on the right.  The left was not visualized.  Despite this question  of recurrence, her blood pressure has remained quite well-controlled and has  not necessitated any changes in her medications.  Based on the question of  recurrent restenosis, she is scheduled for angiography.   PAST MEDICAL HISTORY:  1. Coronary disease, status post coronary artery bypass grafting in 1988.     Catheterization for acute coronary syndrome, June 2004, demonstrated EF     60% with focal apical akinesis, pulmonary hypertension (72/29).  She has     substantial residual  coronary disease in June and underwent stenting of     the vein graft to the marginal.  2. Hypertension.  3. History of congestive heart failure.  4. History of irritable bowel syndrome.  5. Dyslipidemia.   ALLERGIES:  Chart history of PLAVIX allergy.  On discussion with patient and  her husband, who is a Teacher, early years/pre, they think that the rash which was  initially attributed to Plavix has persisted for years after cessation of  the Plavix.  They are now convinced that she is not allergic to Plavix.  She  is allergic to PENICILLIN.   CURRENT MEDICATIONS:  1. Lotensin 20 mg per day.  2. Apresoline 25 mg t.i.d.  3. Ticlid 250 mg twice per day.  4. Zocor 40 mg q.h.s.  5. Lasix 40 mg per day.  6. Enteric coated aspirin 325 mg per day.  7. Nitrostat p.r.n.   SOCIAL HISTORY:  The patient lives in [place] with her  husband.  Denies  tobacco and alcohol use.   FAMILY HISTORY:  Father died of MI at 66.  She has siblings with premature  atherosclerotic disease.   REVIEW OF SYSTEMS:  Negative in detail with the sole exception of worsening  of chronic diarrhea over the past several months.  She has had multiple  stools per day.  She feels this is substantially worse than her usual  pattern, attributed to irritable bowel syndrome.   PHYSICAL EXAMINATION:  This will be updated in the chart upon admission.   IMPRESSION/RECOMMENDATION:  A 77 year old lady, status post cutting balloon  angioplasty of right renal artery end-stent restenosis in late June of this  year.  Although her blood pressure remains well-controlled, ultrasound  raises concern for recurrent end-stent restenosis.  She is therefore,  admitted for angiography to clarify the diagnosis.  Her diarrhea may  certainly be made worse by the Ticlid.  Will therefore, discontinue it and  begin Plavix.                                                 Salvadore Farber, M.D.    WED/MEDQ  D:  05/02/2003  T:  05/02/2003  Job:   [id number]   cc:   Mirian Capuchin, M.D.

## 2011-03-20 ENCOUNTER — Ambulatory Visit: Payer: BLUE CROSS/BLUE SHIELD | Admitting: Internal Medicine

## 2011-03-27 ENCOUNTER — Ambulatory Visit: Payer: BLUE CROSS/BLUE SHIELD | Admitting: Internal Medicine

## 2011-05-22 LAB — BASIC METABOLIC PANEL
BUN: 136 — ABNORMAL HIGH
BUN: 21
BUN: 22
BUN: 23
BUN: 28 — ABNORMAL HIGH
CO2: 14 — ABNORMAL LOW
CO2: 15 — ABNORMAL LOW
CO2: 20
CO2: 24
CO2: 25
CO2: 26
CO2: 26
CO2: 26
CO2: 26
CO2: 28
CO2: 28
CO2: 29
CO2: 29
CO2: 30
Calcium: 7.3 — ABNORMAL LOW
Calcium: 7.5 — ABNORMAL LOW
Calcium: 7.9 — ABNORMAL LOW
Calcium: 7.9 — ABNORMAL LOW
Calcium: 7.9 — ABNORMAL LOW
Calcium: 8.1 — ABNORMAL LOW
Calcium: 8.4
Calcium: 8.6
Calcium: 9.1
Chloride: 100
Chloride: 103
Chloride: 103
Chloride: 104
Chloride: 106
Chloride: 109
Chloride: 98
Creatinine, Ser: 1.2
Creatinine, Ser: 1.28 — ABNORMAL HIGH
Creatinine, Ser: 1.37 — ABNORMAL HIGH
Creatinine, Ser: 1.39 — ABNORMAL HIGH
Creatinine, Ser: 1.43 — ABNORMAL HIGH
Creatinine, Ser: 3.27 — ABNORMAL HIGH
Creatinine, Ser: 4.47 — ABNORMAL HIGH
Creatinine, Ser: 5.31 — ABNORMAL HIGH
Creatinine, Ser: 5.4 — ABNORMAL HIGH
GFR calc Af Amer: 11 — ABNORMAL LOW
GFR calc Af Amer: 16 — ABNORMAL LOW
GFR calc Af Amer: 19 — ABNORMAL LOW
GFR calc Af Amer: 28 — ABNORMAL LOW
GFR calc Af Amer: 44 — ABNORMAL LOW
GFR calc Af Amer: 48 — ABNORMAL LOW
GFR calc Af Amer: 49 — ABNORMAL LOW
GFR calc Af Amer: 52 — ABNORMAL LOW
GFR calc non Af Amer: 14 — ABNORMAL LOW
GFR calc non Af Amer: 35 — ABNORMAL LOW
GFR calc non Af Amer: 39 — ABNORMAL LOW
GFR calc non Af Amer: 40 — ABNORMAL LOW
GFR calc non Af Amer: 9 — ABNORMAL LOW
Glucose, Bld: 100 — ABNORMAL HIGH
Glucose, Bld: 103 — ABNORMAL HIGH
Glucose, Bld: 103 — ABNORMAL HIGH
Glucose, Bld: 105 — ABNORMAL HIGH
Glucose, Bld: 113 — ABNORMAL HIGH
Glucose, Bld: 123 — ABNORMAL HIGH
Glucose, Bld: 96
Glucose, Bld: 97
Glucose, Bld: 97
Glucose, Bld: 97
Glucose, Bld: 98
Potassium: 3.7
Potassium: 3.8
Potassium: 4
Potassium: 4.1
Potassium: 4.6
Potassium: 6.4
Sodium: 128 — ABNORMAL LOW
Sodium: 130 — ABNORMAL LOW
Sodium: 132 — ABNORMAL LOW
Sodium: 134 — ABNORMAL LOW
Sodium: 135
Sodium: 135
Sodium: 135
Sodium: 138
Sodium: 139
Sodium: 141

## 2011-05-22 LAB — CBC
HCT: 29.3 — ABNORMAL LOW
HCT: 30 — ABNORMAL LOW
HCT: 30.2 — ABNORMAL LOW
HCT: 31.3 — ABNORMAL LOW
HCT: 32.2 — ABNORMAL LOW
Hemoglobin: 10.2 — ABNORMAL LOW
Hemoglobin: 10.4 — ABNORMAL LOW
Hemoglobin: 10.4 — ABNORMAL LOW
Hemoglobin: 10.6 — ABNORMAL LOW
Hemoglobin: 10.7 — ABNORMAL LOW
Hemoglobin: 10.9 — ABNORMAL LOW
Hemoglobin: 11.5 — ABNORMAL LOW
Hemoglobin: 12
MCHC: 32.5
MCHC: 32.7
MCHC: 32.7
MCHC: 32.9
MCHC: 33.1
MCHC: 33.2
MCHC: 33.3
MCHC: 33.3
MCHC: 33.7
MCHC: 33.8
MCHC: 34.1
MCV: 85
MCV: 85.7
MCV: 86.4
MCV: 87.1
Platelets: 151
Platelets: 159
Platelets: 165
Platelets: 179
Platelets: 229
RBC: 3.43 — ABNORMAL LOW
RBC: 3.48 — ABNORMAL LOW
RBC: 3.51 — ABNORMAL LOW
RBC: 3.86 — ABNORMAL LOW
RBC: 3.87
RBC: 4.08
RDW: 20.2 — ABNORMAL HIGH
RDW: 20.7 — ABNORMAL HIGH
RDW: 20.7 — ABNORMAL HIGH
RDW: 20.7 — ABNORMAL HIGH
RDW: 20.8 — ABNORMAL HIGH
RDW: 20.8 — ABNORMAL HIGH
RDW: 20.9 — ABNORMAL HIGH
RDW: 21 — ABNORMAL HIGH
RDW: 21 — ABNORMAL HIGH
RDW: 21.1 — ABNORMAL HIGH
RDW: 21.1 — ABNORMAL HIGH
RDW: 21.4 — ABNORMAL HIGH
WBC: 7.1
WBC: 8.2

## 2011-05-22 LAB — POCT I-STAT 3, VENOUS BLOOD GAS (G3P V)
Acid-Base Excess: 5 — ABNORMAL HIGH
O2 Saturation: 62
Operator id: 222701
pO2, Ven: 31

## 2011-05-22 LAB — POCT I-STAT 3, ART BLOOD GAS (G3+)
Acid-Base Excess: 5 — ABNORMAL HIGH
Acid-Base Excess: 6 — ABNORMAL HIGH
Acid-base deficit: 13 — ABNORMAL HIGH
Acid-base deficit: 14 — ABNORMAL HIGH
Bicarbonate: 12.9 — ABNORMAL LOW
O2 Saturation: 94
O2 Saturation: 94
O2 Saturation: 98
Operator id: 221591
Operator id: 222701
Patient temperature: 97.3
Patient temperature: 97.8
Patient temperature: 98.9
TCO2: 14
TCO2: 17
TCO2: 31
pCO2 arterial: 30.5 — ABNORMAL LOW
pCO2 arterial: 32.3 — ABNORMAL LOW
pCO2 arterial: 37.8
pH, Arterial: 7.313 — ABNORMAL LOW
pH, Arterial: 7.447 — ABNORMAL HIGH

## 2011-05-22 LAB — PROTIME-INR
INR: 1.3
INR: 2 — ABNORMAL HIGH
Prothrombin Time: 16 — ABNORMAL HIGH
Prothrombin Time: 23.2 — ABNORMAL HIGH

## 2011-05-22 LAB — COMPREHENSIVE METABOLIC PANEL
ALT: 32
AST: 78 — ABNORMAL HIGH
CO2: 16 — ABNORMAL LOW
Calcium: 7.7 — ABNORMAL LOW
Chloride: 104
GFR calc Af Amer: 9 — ABNORMAL LOW
GFR calc non Af Amer: 7 — ABNORMAL LOW
Sodium: 129 — ABNORMAL LOW
Total Bilirubin: 0.8

## 2011-05-22 LAB — TYPE AND SCREEN: Antibody Screen: NEGATIVE

## 2011-05-22 LAB — IRON AND TIBC
Iron: 44
TIBC: 239 — ABNORMAL LOW

## 2011-05-22 LAB — CULTURE, BLOOD (ROUTINE X 2): Culture: NO GROWTH

## 2011-05-22 LAB — URINE CULTURE

## 2011-05-22 LAB — ABO/RH: ABO/RH(D): O POS

## 2011-05-22 LAB — ETHANOL: Alcohol, Ethyl (B): <5

## 2011-05-22 LAB — HEPARIN LEVEL (UNFRACTIONATED): Heparin Unfractionated: 0.44

## 2011-05-22 LAB — B-NATRIURETIC PEPTIDE (CONVERTED LAB): Pro B Natriuretic peptide (BNP): >3200 — ABNORMAL HIGH

## 2011-05-22 LAB — CARDIAC PANEL(CRET KIN+CKTOT+MB+TROPI)
CK, MB: 49.5 — ABNORMAL HIGH
CK, MB: 5.8 — ABNORMAL HIGH
CK, MB: 52.6 — ABNORMAL HIGH
Relative Index: 9.7 — ABNORMAL HIGH
Total CK: 219 — ABNORMAL HIGH
Total CK: 542 — ABNORMAL HIGH
Troponin I: 13.16
Troponin I: 4.38

## 2011-05-22 LAB — HEMOGLOBIN AND HEMATOCRIT, BLOOD: HCT: 28.5 — ABNORMAL LOW

## 2011-05-22 LAB — RAPID URINE DRUG SCREEN, HOSP PERFORMED: Cocaine: NOT DETECTED

## 2011-05-22 LAB — LACTIC ACID, PLASMA: Lactic Acid, Venous: 0.6

## 2011-05-22 LAB — PREPARE FRESH FROZEN PLASMA

## 2011-05-23 LAB — RENAL FUNCTION PANEL
Albumin: 3.7
BUN: 48 — ABNORMAL HIGH
CO2: 21
CO2: 23
Calcium: 9.7
Chloride: 101
Creatinine, Ser: 1.47 — ABNORMAL HIGH
GFR calc Af Amer: 41 — ABNORMAL LOW
GFR calc Af Amer: 48 — ABNORMAL LOW
GFR calc non Af Amer: 34 — ABNORMAL LOW
GFR calc non Af Amer: 40 — ABNORMAL LOW
Sodium: 134 — ABNORMAL LOW

## 2011-05-23 LAB — BASIC METABOLIC PANEL
BUN: 45 — ABNORMAL HIGH
BUN: 56 — ABNORMAL HIGH
CO2: 23
Calcium: 8.9
Calcium: 9
Calcium: 9.1
Chloride: 98
Creatinine, Ser: 1.36 — ABNORMAL HIGH
Creatinine, Ser: 1.41 — ABNORMAL HIGH
Creatinine, Ser: 1.59 — ABNORMAL HIGH
Creatinine, Ser: 1.74 — ABNORMAL HIGH
GFR calc Af Amer: 34 — ABNORMAL LOW
GFR calc Af Amer: 38 — ABNORMAL LOW
GFR calc Af Amer: 43 — ABNORMAL LOW
GFR calc non Af Amer: 28 — ABNORMAL LOW
GFR calc non Af Amer: 31 — ABNORMAL LOW
GFR calc non Af Amer: 36 — ABNORMAL LOW
GFR calc non Af Amer: 37 — ABNORMAL LOW
Glucose, Bld: 95
Potassium: 4.9
Sodium: 131 — ABNORMAL LOW

## 2011-05-23 LAB — URINALYSIS, ROUTINE W REFLEX MICROSCOPIC
Bilirubin Urine: NEGATIVE
Glucose, UA: NEGATIVE
Hgb urine dipstick: NEGATIVE
Ketones, ur: NEGATIVE
pH: 5.5

## 2011-05-23 LAB — PROTIME-INR
INR: 1
INR: 1.1
INR: 1.1
Prothrombin Time: 13.6
Prothrombin Time: 13.8
Prothrombin Time: 14
Prothrombin Time: 14.2
Prothrombin Time: 14.5

## 2011-05-23 LAB — CBC
HCT: 28.6 — ABNORMAL LOW
HCT: 29 — ABNORMAL LOW
HCT: 33.5 — ABNORMAL LOW
Hemoglobin: 9.6 — ABNORMAL LOW
MCHC: 33.8
MCHC: 34.5
MCV: 87.2
MCV: 88.2
Platelets: 249
Platelets: 250
Platelets: 250
Platelets: 345
RBC: 3.05 — ABNORMAL LOW
RDW: 20.3 — ABNORMAL HIGH
RDW: 20.4 — ABNORMAL HIGH
RDW: 21.2 — ABNORMAL HIGH
WBC: 6.7
WBC: 6.7
WBC: 7.9
WBC: 8

## 2011-06-10 ENCOUNTER — Encounter: Payer: Self-pay | Admitting: Cardiovascular Disease

## 2011-06-12 ENCOUNTER — Encounter: Payer: Self-pay | Admitting: Cardiovascular Disease

## 2011-06-12 ENCOUNTER — Ambulatory Visit (INDEPENDENT_AMBULATORY_CARE_PROVIDER_SITE_OTHER): Payer: Medicare Other | Admitting: Cardiovascular Disease

## 2011-06-12 DIAGNOSIS — I701 Atherosclerosis of renal artery: Secondary | ICD-10-CM

## 2011-06-12 DIAGNOSIS — I4891 Unspecified atrial fibrillation: Secondary | ICD-10-CM

## 2011-06-12 DIAGNOSIS — I25810 Atherosclerosis of coronary artery bypass graft(s) without angina pectoris: Secondary | ICD-10-CM

## 2011-06-12 DIAGNOSIS — I10 Essential (primary) hypertension: Secondary | ICD-10-CM

## 2011-06-12 DIAGNOSIS — E785 Hyperlipidemia, unspecified: Secondary | ICD-10-CM

## 2011-06-12 NOTE — Assessment & Plan Note (Signed)
Currently with no symptoms of angina. No further workup at this time. Continue current medication regimen. 

## 2011-06-12 NOTE — Assessment & Plan Note (Signed)
Heart rate is relatively well-controlled. She is on warfarin. Recent falls and she will need to be monitored closely to determine if warfarin needs to be discontinued.

## 2011-06-12 NOTE — Assessment & Plan Note (Signed)
Blood pressure is low on today's visit bilaterally. We have suggested she decrease the hydralazine to 50 mg in the morning, down from 100 mg. She is very concerned about high blood pressure prior to taking hydralazine. We have suggested that she bring in her blood pressure cuff to our office or to Dr. Hyacinth Meeker to verify that her blood pressure cuff is accurate.  As her blood pressure by her numbers does appear elevated in the morning prior to medications, and is very low this morning in our clinic, we have suggested she take benazepril 20 mg at night rather than 10 mg b.i.d..

## 2011-06-12 NOTE — Assessment & Plan Note (Signed)
We have suggested she stay on pravastatin she appears to be tolerating this well

## 2011-06-12 NOTE — Assessment & Plan Note (Signed)
She does have a history of significant renal artery stenosis. Decision was made by the patient in the past that she did not want aggressive intervention.

## 2011-06-12 NOTE — Progress Notes (Signed)
Patient ID: [name], female    DOB: 11/08/1922, 77 y.o.   MRN: [id number]  HPI Comments: [name] is a delightful 77 year old white female  with multiple medical problems including coronary artery disease status post bypass surgery in 1998 with several subsequent coronary interventions, angioplasty and stenting of the RCA with 2 bare-metal stents in June 2009, history of chronic atrial fibrillation, diastolic heart failure, anemia, renal insufficiency, and renal artery stenosis status post right renal artery stenting.she presents for routine followup.  History of dementia. She has renal artery stenosis that has been progressive with some increasing atrophy of kidney.  She reports that she had a recent fall several weeks ago. She had a laceration to her head. Since the fall, she has felt her right leg rotate inwards and it has been difficult to walk. She does have some pain and reports having had a history of right hip replacement.   She does bring some blood pressure numbers and has typically measured her blood pressure prior to taking her medications in the morning. By her numbers, they are typically elevated with systolic pressures in the 150-170 range. She does not have any measurements during the daytime after she is taking her medications.  She is not walking well, is not exercising or doing rehabilitation because of right leg weakness. She feels much weaker in general. She has a lady friend who takes care of all of her medications  severe arthritis pain in hands, back and neck. Burises extensively with coumadin but no bleeding.   EKG shows atrial fibrillation with ventricular rate 75 beats per minute, no significant ST or T wave changes      Outpatient Encounter Prescriptions as of 06/12/2011  Medication Sig Dispense Refill  . aspirin 81 MG EC tablet Take 81 mg by mouth daily.        . benazepril (LOTENSIN) 10 MG tablet Take 10 mg by mouth 2 (two) times daily.        .  Cholecalciferol (VITAMIN D3) 1000 UNITS CAPS Take 1 tablet by mouth daily.        . hydrALAZINE (APRESOLINE) 50 MG tablet Take 100 mg by mouth 3 (three) times daily.       Marland Kitchen levothyroxine (SYNTHROID, LEVOTHROID) 50 MCG tablet Take 50 mcg by mouth daily.        Marland Kitchen LORazepam (ATIVAN) 0.5 MG tablet Take 0.5 mg by mouth every 8 (eight) hours. Take 1/2 tablet in the am, 1/2 at noon, and full tablet in the pm      . metoprolol (TOPROL-XL) 50 MG 24 hr tablet Take 25 mg by mouth 2 (two) times daily.       . Multiple Vitamins-Minerals (CENTRUM SILVER ULTRA WOMENS PO) Take 1 tablet by mouth daily.        . nitroGLYCERIN (NITROSTAT) 0.4 MG SL tablet Place 0.4 mg under the tongue every 5 (five) minutes as needed.        . pravastatin (PRAVACHOL) 40 MG tablet Take 40 mg by mouth daily.        . Probiotic Product (FLORA-Q PO) Take 1 tablet by mouth daily.        . traMADol (ULTRAM) 50 MG tablet Take 50 mg by mouth every 6 (six) hours as needed.        . warfarin (COUMADIN) 5 MG tablet Take 5 mg by mouth as directed. By coumadin clinic         Review of Systems  Constitutional: Positive for  unexpected weight change.  HENT: Negative.   Eyes: Negative.   Respiratory: Negative.   Cardiovascular: Negative.   Gastrointestinal: Negative.   Musculoskeletal: Positive for back pain, arthralgias and gait problem.  Skin: Negative.   Neurological: Positive for weakness.  Hematological: Negative.   Psychiatric/Behavioral: Negative.   All other systems reviewed and are negative.    BP 100/58  Pulse 75  Ht 5\' 5"  (1.651 m)  Wt 111 lb (50.349 kg)  BMI 18.47 kg/m2  Physical Exam  Nursing note and vitals reviewed. Constitutional: She is oriented to person, place, and time. She appears well-developed and well-nourished.       Thin, very frail, able to ambulate with her walker with internal rotation of her right leg  HENT:  Head: Normocephalic.  Nose: Nose normal.  Mouth/Throat: Oropharynx is clear and moist.    Eyes: Conjunctivae are normal. Pupils are equal, round, and reactive to light.  Neck: Normal range of motion. Neck supple. No JVD present.  Cardiovascular: Normal rate, regular rhythm, S1 normal, S2 normal, normal heart sounds and intact distal pulses.  Exam reveals no gallop and no friction rub.   No murmur heard. Pulmonary/Chest: Effort normal and breath sounds normal. No respiratory distress. She has no wheezes. She has no rales. She exhibits no tenderness.  Abdominal: Soft. Bowel sounds are normal. She exhibits no distension. There is no tenderness.  Musculoskeletal: Normal range of motion. She exhibits no edema and no tenderness.  Lymphadenopathy:    She has no cervical adenopathy.  Neurological: She is alert and oriented to person, place, and time. Coordination normal.  Skin: Skin is warm and dry. No rash noted. No erythema.  Psychiatric: She has a normal mood and affect. Her behavior is normal. Judgment and thought content normal.         Assessment and Plan

## 2011-06-12 NOTE — Patient Instructions (Signed)
Please decrease the hydralazine in the morning to one Pill (instead of two) Continue to take hydralazine one at lunch and one in the PM Please take benazepril two pills at night  Please call us if you have new issues that need to be addressed before your next appt.  The office will contact you for a follow up Appt. In 6 months

## 2011-07-12 ENCOUNTER — Encounter: Payer: Self-pay | Admitting: Cardiovascular Disease

## 2011-09-27 ENCOUNTER — Encounter (HOSPITAL_COMMUNITY): Payer: Self-pay | Admitting: Emergency Medicine

## 2011-09-27 ENCOUNTER — Emergency Department (HOSPITAL_COMMUNITY): Payer: Medicare Other

## 2011-09-27 ENCOUNTER — Other Ambulatory Visit: Payer: Self-pay

## 2011-09-27 ENCOUNTER — Inpatient Hospital Stay (HOSPITAL_COMMUNITY)
Admission: EM | Admit: 2011-09-27 | Discharge: 2011-10-03 | DRG: 914 | Disposition: A | Discharge status: Skilled Nursing Facility | Payer: Medicare Other | Attending: Surgery | Admitting: Surgery

## 2011-09-27 DIAGNOSIS — R339 Retention of urine, unspecified: Secondary | ICD-10-CM | POA: Diagnosis not present

## 2011-09-27 DIAGNOSIS — Z7901 Long term (current) use of anticoagulants: Secondary | ICD-10-CM

## 2011-09-27 DIAGNOSIS — Z96649 Presence of unspecified artificial hip joint: Secondary | ICD-10-CM

## 2011-09-27 DIAGNOSIS — I150 Renovascular hypertension: Secondary | ICD-10-CM | POA: Diagnosis present

## 2011-09-27 DIAGNOSIS — S0101XA Laceration without foreign body of scalp, initial encounter: Secondary | ICD-10-CM | POA: Diagnosis present

## 2011-09-27 DIAGNOSIS — T68XXXA Hypothermia, initial encounter: Secondary | ICD-10-CM

## 2011-09-27 DIAGNOSIS — S0003XA Contusion of scalp, initial encounter: Secondary | ICD-10-CM

## 2011-09-27 DIAGNOSIS — I10 Essential (primary) hypertension: Secondary | ICD-10-CM | POA: Insufficient documentation

## 2011-09-27 DIAGNOSIS — I4891 Unspecified atrial fibrillation: Secondary | ICD-10-CM

## 2011-09-27 DIAGNOSIS — S97109A Crushing injury of unspecified toe(s), initial encounter: Principal | ICD-10-CM | POA: Diagnosis present

## 2011-09-27 DIAGNOSIS — Z880 Allergy status to penicillin: Secondary | ICD-10-CM

## 2011-09-27 DIAGNOSIS — I509 Heart failure, unspecified: Secondary | ICD-10-CM | POA: Diagnosis present

## 2011-09-27 DIAGNOSIS — S32009A Unspecified fracture of unspecified lumbar vertebra, initial encounter for closed fracture: Secondary | ICD-10-CM

## 2011-09-27 DIAGNOSIS — I2510 Atherosclerotic heart disease of native coronary artery without angina pectoris: Secondary | ICD-10-CM | POA: Diagnosis present

## 2011-09-27 DIAGNOSIS — I701 Atherosclerosis of renal artery: Secondary | ICD-10-CM | POA: Diagnosis present

## 2011-09-27 DIAGNOSIS — S058X9A Other injuries of unspecified eye and orbit, initial encounter: Secondary | ICD-10-CM | POA: Diagnosis present

## 2011-09-27 DIAGNOSIS — S92911A Unspecified fracture of right toe(s), initial encounter for closed fracture: Secondary | ICD-10-CM

## 2011-09-27 DIAGNOSIS — M8448XA Pathological fracture, other site, initial encounter for fracture: Secondary | ICD-10-CM | POA: Diagnosis present

## 2011-09-27 DIAGNOSIS — S32039A Unspecified fracture of third lumbar vertebra, initial encounter for closed fracture: Secondary | ICD-10-CM

## 2011-09-27 DIAGNOSIS — T07XXXA Unspecified multiple injuries, initial encounter: Secondary | ICD-10-CM | POA: Diagnosis present

## 2011-09-27 DIAGNOSIS — I5030 Unspecified diastolic (congestive) heart failure: Secondary | ICD-10-CM | POA: Diagnosis present

## 2011-09-27 DIAGNOSIS — S0083XA Contusion of other part of head, initial encounter: Secondary | ICD-10-CM

## 2011-09-27 DIAGNOSIS — S0180XA Unspecified open wound of other part of head, initial encounter: Secondary | ICD-10-CM | POA: Diagnosis present

## 2011-09-27 DIAGNOSIS — S1093XA Contusion of unspecified part of neck, initial encounter: Secondary | ICD-10-CM

## 2011-09-27 DIAGNOSIS — Z951 Presence of aortocoronary bypass graft: Secondary | ICD-10-CM

## 2011-09-27 DIAGNOSIS — S92909A Unspecified fracture of unspecified foot, initial encounter for closed fracture: Secondary | ICD-10-CM

## 2011-09-27 DIAGNOSIS — Z886 Allergy status to analgesic agent status: Secondary | ICD-10-CM

## 2011-09-27 DIAGNOSIS — I2789 Other specified pulmonary heart diseases: Secondary | ICD-10-CM | POA: Diagnosis present

## 2011-09-27 DIAGNOSIS — S5000XA Contusion of unspecified elbow, initial encounter: Secondary | ICD-10-CM | POA: Diagnosis present

## 2011-09-27 DIAGNOSIS — S0100XA Unspecified open wound of scalp, initial encounter: Secondary | ICD-10-CM | POA: Diagnosis present

## 2011-09-27 DIAGNOSIS — S32030A Wedge compression fracture of third lumbar vertebra, initial encounter for closed fracture: Secondary | ICD-10-CM | POA: Diagnosis present

## 2011-09-27 DIAGNOSIS — S92919B Unspecified fracture of unspecified toe(s), initial encounter for open fracture: Secondary | ICD-10-CM | POA: Diagnosis present

## 2011-09-27 DIAGNOSIS — Z90710 Acquired absence of both cervix and uterus: Secondary | ICD-10-CM

## 2011-09-27 DIAGNOSIS — S0181XA Laceration without foreign body of other part of head, initial encounter: Secondary | ICD-10-CM | POA: Diagnosis present

## 2011-09-27 DIAGNOSIS — K589 Irritable bowel syndrome without diarrhea: Secondary | ICD-10-CM | POA: Diagnosis present

## 2011-09-27 DIAGNOSIS — S92911B Unspecified fracture of right toe(s), initial encounter for open fracture: Secondary | ICD-10-CM | POA: Diagnosis present

## 2011-09-27 DIAGNOSIS — D62 Acute posthemorrhagic anemia: Secondary | ICD-10-CM | POA: Diagnosis present

## 2011-09-27 DIAGNOSIS — E785 Hyperlipidemia, unspecified: Secondary | ICD-10-CM | POA: Insufficient documentation

## 2011-09-27 DIAGNOSIS — S0590XA Unspecified injury of unspecified eye and orbit, initial encounter: Secondary | ICD-10-CM | POA: Diagnosis present

## 2011-09-27 LAB — DIFFERENTIAL
Basophils Absolute: 0.1 10*3/uL (ref 0.0–0.1)
Eosinophils Relative: 5 % (ref 0–5)
Lymphocytes Relative: 27 % (ref 12–46)
Monocytes Absolute: 1.1 10*3/uL — ABNORMAL HIGH (ref 0.1–1.0)
Monocytes Relative: 13 % — ABNORMAL HIGH (ref 3–12)

## 2011-09-27 LAB — CBC
HCT: 30 % — ABNORMAL LOW (ref 36.0–46.0)
MCHC: 33.3 g/dL (ref 30.0–36.0)
MCV: 88 fL (ref 78.0–100.0)
RDW: 15.1 % (ref 11.5–15.5)
WBC: 9 10*3/uL (ref 4.0–10.5)

## 2011-09-27 LAB — COMPREHENSIVE METABOLIC PANEL
AST: 24 U/L (ref 0–37)
BUN: 20 mg/dL (ref 6–23)
CO2: 22 mEq/L (ref 19–32)
Calcium: 8.9 mg/dL (ref 8.4–10.5)
Creatinine, Ser: 0.86 mg/dL (ref 0.50–1.10)
GFR calc Af Amer: 69 mL/min — ABNORMAL LOW (ref >90)
GFR calc non Af Amer: 59 mL/min — ABNORMAL LOW (ref >90)
Glucose, Bld: 97 mg/dL (ref 70–99)

## 2011-09-27 LAB — LIPASE, BLOOD: Lipase: 56 U/L (ref 11–59)

## 2011-09-27 LAB — LACTIC ACID, PLASMA: Lactic Acid, Venous: 1.4 mmol/L (ref 0.5–2.2)

## 2011-09-27 MED ORDER — FENTANYL CITRATE 0.05 MG/ML IJ SOLN
25.0000 ug | Freq: Once | INTRAMUSCULAR | Status: AC
  Administered 2011-09-27: 25 ug via INTRAVENOUS
  Filled 2011-09-27: qty 2

## 2011-09-27 MED ORDER — SODIUM CHLORIDE 0.9 % IV BOLUS (SEPSIS)
1000.0000 mL | Freq: Once | INTRAVENOUS | Status: AC
  Administered 2011-09-27: 1000 mL via INTRAVENOUS

## 2011-09-27 MED ORDER — TETANUS-DIPHTH-ACELL PERTUSSIS 5-2.5-18.5 LF-MCG/0.5 IM SUSP
0.5000 mL | Freq: Once | INTRAMUSCULAR | Status: AC
  Administered 2011-09-27: 0.5 mL via INTRAMUSCULAR
  Filled 2011-09-27: qty 0.5

## 2011-09-27 NOTE — Progress Notes (Signed)
Orthopedic Tech Progress Note Patient Details:  [name] 08-Feb-1923 [id number]  Patient ID: [name], female   DOB: 1923-02-25, 78 y.o.   MRN: [id number] Made trauma visit  [name] 09/27/2011, 10:32 PM
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
## 2011-09-28 ENCOUNTER — Observation Stay (HOSPITAL_COMMUNITY): Payer: Medicare Other

## 2011-09-28 ENCOUNTER — Emergency Department (HOSPITAL_COMMUNITY): Payer: Medicare Other

## 2011-09-28 LAB — CBC
HCT: 20.8 % — ABNORMAL LOW (ref 36.0–46.0)
HCT: 21.1 % — ABNORMAL LOW (ref 36.0–46.0)
Hemoglobin: 7 g/dL — ABNORMAL LOW (ref 12.0–15.0)
Hemoglobin: 7 g/dL — ABNORMAL LOW (ref 12.0–15.0)
MCH: 29.2 pg (ref 26.0–34.0)
MCHC: 33.2 g/dL (ref 30.0–36.0)
MCV: 87.9 fL (ref 78.0–100.0)
Platelets: 208 10*3/uL (ref 150–400)
RBC: 2.35 MIL/uL — ABNORMAL LOW (ref 3.87–5.11)
RBC: 2.4 MIL/uL — ABNORMAL LOW (ref 3.87–5.11)
RDW: 15.3 % (ref 11.5–15.5)
WBC: 11.4 10*3/uL — ABNORMAL HIGH (ref 4.0–10.5)
WBC: 11.2 10*3/uL — ABNORMAL HIGH (ref 4.0–10.5)

## 2011-09-28 LAB — URINALYSIS, ROUTINE W REFLEX MICROSCOPIC
Bilirubin Urine: NEGATIVE
Ketones, ur: NEGATIVE mg/dL
Leukocytes, UA: NEGATIVE
Nitrite: NEGATIVE
Protein, ur: NEGATIVE mg/dL
Urobilinogen, UA: 0.2 mg/dL (ref 0.0–1.0)

## 2011-09-28 LAB — BASIC METABOLIC PANEL
BUN: 20 mg/dL (ref 6–23)
Chloride: 104 mEq/L (ref 96–112)
GFR calc Af Amer: 69 mL/min — ABNORMAL LOW (ref >90)
Glucose, Bld: 163 mg/dL — ABNORMAL HIGH (ref 70–99)
Potassium: 3.7 mEq/L (ref 3.5–5.1)

## 2011-09-28 LAB — PROTIME-INR: INR: 2.76 — ABNORMAL HIGH (ref 0.00–1.49)

## 2011-09-28 LAB — MRSA PCR SCREENING: MRSA by PCR: NEGATIVE

## 2011-09-28 MED ORDER — HYDRALAZINE HCL 50 MG PO TABS
50.0000 mg | ORAL_TABLET | Freq: Three times a day (TID) | ORAL | Status: DC
  Administered 2011-09-28 – 2011-10-02 (×13): 50 mg via ORAL
  Filled 2011-09-28 (×18): qty 1

## 2011-09-28 MED ORDER — FENTANYL CITRATE 0.05 MG/ML IJ SOLN
25.0000 ug | Freq: Once | INTRAMUSCULAR | Status: AC
  Administered 2011-09-28: 100 ug via INTRAVENOUS
  Filled 2011-09-28: qty 2

## 2011-09-28 MED ORDER — POLYVINYL ALCOHOL 1.4 % OP SOLN
2.0000 [drp] | OPHTHALMIC | Status: DC
  Administered 2011-09-28 – 2011-10-03 (×24): 2 [drp] via OPHTHALMIC
  Filled 2011-09-28 (×2): qty 15

## 2011-09-28 MED ORDER — SIMVASTATIN 40 MG PO TABS
40.0000 mg | ORAL_TABLET | Freq: Every day | ORAL | Status: DC
  Administered 2011-09-28 – 2011-10-02 (×5): 40 mg via ORAL
  Filled 2011-09-28 (×6): qty 1

## 2011-09-28 MED ORDER — PANTOPRAZOLE SODIUM 40 MG IV SOLR
40.0000 mg | Freq: Every day | INTRAVENOUS | Status: DC
  Filled 2011-09-28 (×3): qty 40

## 2011-09-28 MED ORDER — IOHEXOL 300 MG/ML  SOLN
100.0000 mL | Freq: Once | INTRAMUSCULAR | Status: AC | PRN
  Administered 2011-09-28: 100 mL via INTRAVENOUS

## 2011-09-28 MED ORDER — MORPHINE SULFATE 2 MG/ML IJ SOLN
2.0000 mg | INTRAMUSCULAR | Status: DC | PRN
  Administered 2011-09-28 – 2011-09-30 (×7): 2 mg via INTRAVENOUS
  Filled 2011-09-28 (×7): qty 1

## 2011-09-28 MED ORDER — HYOSCYAMINE SULFATE 0.125 MG SL SUBL
0.1250 mg | SUBLINGUAL_TABLET | Freq: Four times a day (QID) | SUBLINGUAL | Status: DC | PRN
  Filled 2011-09-28: qty 1

## 2011-09-28 MED ORDER — TRAZODONE HCL 50 MG PO TABS
75.0000 mg | ORAL_TABLET | Freq: Every day | ORAL | Status: DC
  Administered 2011-09-28 – 2011-10-02 (×5): 75 mg via ORAL
  Filled 2011-09-28 (×6): qty 1

## 2011-09-28 MED ORDER — NITROGLYCERIN 0.4 MG SL SUBL: 0.4000 mg | SUBLINGUAL_TABLET | SUBLINGUAL | Status: DC | PRN

## 2011-09-28 MED ORDER — LEVOTHYROXINE SODIUM 50 MCG PO TABS
50.0000 ug | ORAL_TABLET | Freq: Every day | ORAL | Status: DC
  Administered 2011-09-28 – 2011-10-03 (×5): 50 ug via ORAL
  Filled 2011-09-28 (×8): qty 1

## 2011-09-28 MED ORDER — LORAZEPAM 0.5 MG PO TABS
0.5000 mg | ORAL_TABLET | Freq: Three times a day (TID) | ORAL | Status: DC
  Administered 2011-09-28 – 2011-10-03 (×14): 0.5 mg via ORAL
  Filled 2011-09-28 (×14): qty 1

## 2011-09-28 MED ORDER — PANTOPRAZOLE SODIUM 40 MG PO TBEC
40.0000 mg | DELAYED_RELEASE_TABLET | Freq: Every day | ORAL | Status: DC
  Administered 2011-09-28 – 2011-10-01 (×4): 40 mg via ORAL
  Filled 2011-09-28 (×4): qty 1

## 2011-09-28 MED ORDER — ONDANSETRON HCL 4 MG PO TABS: 4.0000 mg | ORAL_TABLET | Freq: Four times a day (QID) | ORAL | Status: DC | PRN

## 2011-09-28 MED ORDER — LIDOCAINE HCL (PF) 1 % IJ SOLN
5.0000 mL | Freq: Once | INTRAMUSCULAR | Status: AC
  Administered 2011-09-28: 5 mL
  Filled 2011-09-28: qty 5

## 2011-09-28 MED ORDER — METOPROLOL TARTRATE 25 MG PO TABS
25.0000 mg | ORAL_TABLET | Freq: Two times a day (BID) | ORAL | Status: DC
  Administered 2011-09-28 – 2011-09-29 (×4): 25 mg via ORAL
  Filled 2011-09-28 (×6): qty 1

## 2011-09-28 MED ORDER — ACETAMINOPHEN 325 MG PO TABS
650.0000 mg | ORAL_TABLET | ORAL | Status: DC | PRN
  Administered 2011-09-30: 650 mg via ORAL
  Filled 2011-09-28: qty 2

## 2011-09-28 MED ORDER — DOCUSATE SODIUM 100 MG PO CAPS
200.0000 mg | ORAL_CAPSULE | Freq: Every day | ORAL | Status: DC
  Administered 2011-09-28 – 2011-10-03 (×6): 200 mg via ORAL
  Filled 2011-09-28 (×2): qty 2
  Filled 2011-09-28: qty 1
  Filled 2011-09-28: qty 2
  Filled 2011-09-28 (×2): qty 1

## 2011-09-28 MED ORDER — ONDANSETRON HCL 4 MG/2ML IJ SOLN: 4.0000 mg | Freq: Four times a day (QID) | INTRAMUSCULAR | Status: DC | PRN

## 2011-09-28 MED ORDER — TRAMADOL HCL 50 MG PO TABS
50.0000 mg | ORAL_TABLET | Freq: Four times a day (QID) | ORAL | Status: DC | PRN
  Administered 2011-09-28 – 2011-10-03 (×10): 50 mg via ORAL
  Filled 2011-09-28 (×11): qty 1

## 2011-09-28 MED ORDER — KCL IN DEXTROSE-NACL 20-5-0.9 MEQ/L-%-% IV SOLN
INTRAVENOUS | Status: DC
  Administered 2011-09-28 – 2011-10-01 (×2): via INTRAVENOUS
  Filled 2011-09-28 (×4): qty 1000

## 2011-09-28 MED ORDER — FENTANYL CITRATE 0.05 MG/ML IJ SOLN
25.0000 ug | Freq: Once | INTRAMUSCULAR | Status: AC
  Administered 2011-09-28: 25 ug via INTRAVENOUS
  Filled 2011-09-28: qty 2

## 2011-09-28 MED ORDER — BENAZEPRIL HCL 10 MG PO TABS
10.0000 mg | ORAL_TABLET | Freq: Two times a day (BID) | ORAL | Status: DC
  Administered 2011-09-28 – 2011-10-03 (×10): 10 mg via ORAL
  Filled 2011-09-28 (×12): qty 1

## 2011-09-28 NOTE — ED Notes (Signed)
Patient returned from CT scan.

## 2011-09-28 NOTE — ED Notes (Signed)
Patient transported to x-ray at this time.   

## 2011-09-28 NOTE — Progress Notes (Signed)
Full note dictated - report# [id number] Right 1st toe fracture with nail bed injury Dry dressing applied Recommend CAM boot for ambulation Tenderness over right shoulder/humerus - recommend xrays when stable. Will continue to follow
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
## 2011-09-28 NOTE — Progress Notes (Signed)
Orthopedic Tech Progress Note Patient Details:  [name] 30-Jun-1923 [id number]      [name] 09/28/2011, 3:35 AM

## 2011-09-28 NOTE — ED Notes (Signed)
Family at beside. Family given emotional support. 

## 2011-09-28 NOTE — Progress Notes (Signed)
Pt transferred to ICU, b/p 129/59, HR 93, temp 97.6, pt stable.

## 2011-09-28 NOTE — ED Notes (Signed)
Patient back from xray, transported to floor.

## 2011-09-28 NOTE — Progress Notes (Signed)
Patient examined and I agree with the assessment and plan  Violeta Gelinas, MD, MPH, FACS Pager: [phone]  09/28/2011 10:56 AM

## 2011-09-28 NOTE — ED Notes (Signed)
Family updated as to patient's status.

## 2011-09-28 NOTE — Progress Notes (Signed)
Pt temperature is low on arrival to floor. 95.5 rectal. Nurse put heat packs and warm blankets on pt. Md made aware no new orders received.Temperature rechecked was 95.8. Rapid response called and made aware. New heat packs applied and 3 more blankets. Will cont to monitor pt.

## 2011-09-28 NOTE — ED Notes (Signed)
Dr Hyman Hopes and Dr Carolynne Edouard at bedside.
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
## 2011-09-29 LAB — BASIC METABOLIC PANEL
Calcium: 8.6 mg/dL (ref 8.4–10.5)
GFR calc Af Amer: 85 mL/min — ABNORMAL LOW (ref >90)
GFR calc non Af Amer: 74 mL/min — ABNORMAL LOW (ref >90)
Potassium: 4.5 mEq/L (ref 3.5–5.1)
Sodium: 133 mEq/L — ABNORMAL LOW (ref 135–145)

## 2011-09-29 LAB — CBC
MCH: 29.7 pg (ref 26.0–34.0)
MCHC: 33.5 g/dL (ref 30.0–36.0)
RDW: 15.7 % — ABNORMAL HIGH (ref 11.5–15.5)
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
## 2011-09-29 NOTE — Progress Notes (Signed)
Patient ID: [name], female   DOB: 1922-10-12, 78 y.o.   MRN: [id number] Complains of diffuse pain but is alert oriented and neurologically intact. Okay to mobilize as tolerated. Patient's questions answered today.
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
## 2011-09-30 ENCOUNTER — Inpatient Hospital Stay (HOSPITAL_COMMUNITY): Payer: Medicare Other

## 2011-09-30 DIAGNOSIS — I4891 Unspecified atrial fibrillation: Secondary | ICD-10-CM

## 2011-09-30 LAB — URINE MICROSCOPIC-ADD ON

## 2011-09-30 LAB — BASIC METABOLIC PANEL
BUN: 11 mg/dL (ref 6–23)
CO2: 22 mEq/L (ref 19–32)
Chloride: 99 mEq/L (ref 96–112)
GFR calc non Af Amer: 75 mL/min — ABNORMAL LOW (ref >90)
Glucose, Bld: 158 mg/dL — ABNORMAL HIGH (ref 70–99)
Potassium: 4 mEq/L (ref 3.5–5.1)
Sodium: 131 mEq/L — ABNORMAL LOW (ref 135–145)

## 2011-09-30 LAB — URINALYSIS, ROUTINE W REFLEX MICROSCOPIC
Glucose, UA: NEGATIVE mg/dL
Hgb urine dipstick: NEGATIVE
Protein, ur: NEGATIVE mg/dL

## 2011-09-30 LAB — CBC
HCT: 25.7 % — ABNORMAL LOW (ref 36.0–46.0)
Hemoglobin: 8.7 g/dL — ABNORMAL LOW (ref 12.0–15.0)
MCHC: 33.9 g/dL (ref 30.0–36.0)
RBC: 3 MIL/uL — ABNORMAL LOW (ref 3.87–5.11)

## 2011-09-30 LAB — PREPARE FRESH FROZEN PLASMA: Unit division: 0

## 2011-09-30 LAB — PROTIME-INR
INR: 1.85 — ABNORMAL HIGH (ref 0.00–1.49)
Prothrombin Time: 21.7 seconds — ABNORMAL HIGH (ref 11.6–15.2)

## 2011-09-30 MED ORDER — HYDROCODONE-ACETAMINOPHEN 5-325 MG PO TABS
1.0000 | ORAL_TABLET | ORAL | Status: DC | PRN
  Administered 2011-09-30 – 2011-10-01 (×2): 1 via ORAL
  Administered 2011-10-01: 2 via ORAL
  Administered 2011-10-02: 1 via ORAL
  Administered 2011-10-02: 2 via ORAL
  Administered 2011-10-02 – 2011-10-03 (×2): 1 via ORAL
  Administered 2011-10-03 (×3): 2 via ORAL
  Filled 2011-09-30 (×2): qty 2
  Filled 2011-09-30: qty 1
  Filled 2011-09-30 (×3): qty 2
  Filled 2011-09-30 (×2): qty 1
  Filled 2011-09-30 (×3): qty 2

## 2011-09-30 MED ORDER — METOPROLOL TARTRATE 50 MG PO TABS
50.0000 mg | ORAL_TABLET | Freq: Two times a day (BID) | ORAL | Status: DC
  Administered 2011-09-30 – 2011-10-02 (×6): 50 mg via ORAL
  Filled 2011-09-30 (×8): qty 1

## 2011-09-30 MED ORDER — FLORA-Q PO CAPS
1.0000 | ORAL_CAPSULE | Freq: Every day | ORAL | Status: DC
  Administered 2011-09-30 – 2011-10-03 (×4): 1 via ORAL
  Filled 2011-09-30 (×6): qty 1

## 2011-09-30 MED ORDER — MORPHINE SULFATE 2 MG/ML IJ SOLN
2.0000 mg | INTRAMUSCULAR | Status: DC | PRN
  Administered 2011-10-01 (×2): 2 mg via INTRAVENOUS
  Filled 2011-09-30 (×3): qty 1
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
## 2011-09-30 NOTE — Progress Notes (Signed)
Patient complaining of right arm pain.  Very anxious, but okay.  Very alert.  Okay to go to 3000 on telemetry.  Hgb after blood given up to 8.7.  Will check again in AM.  This patient has been seen and I agree with the findings and treatment plan.  Marta Lamas. Gae Bon, MD, FACS [phone] (pager) [phone] (direct pager) Trauma Surgeon

## 2011-09-30 NOTE — Progress Notes (Signed)
Physical Therapy Treatment Patient Details Name: [name] MRN: [id number] DOB: [birth year] Today's Date: 09/30/2011  PT Assessment/Plan  PT - Assessment/Plan Comments on Treatment Session: Pt's anxiety re: her abilities/performance continued to escalate throughout the session.  Initially responded to deep breathing/relaxation techniques, however became less effective as session progressed. Lots of reassurance provided that she is making good progress.  PT Plan: Frequency remains appropriate;Discharge plan needs to be updated PT Frequency: Min 4X/week Recommendations for Other Services: Rehab consult Follow Up Recommendations: Inpatient Rehab (anticipate slower progress; 24 hr support) Equipment Recommended: None recommended by PT PT Goals  Acute Rehab PT Goals PT Goal: Supine/Side to Sit - Progress: Progressing toward goal PT Goal: Sit to Stand - Progress: Progressing toward goal PT Goal: Stand to Sit - Progress: Progressing toward goal PT Transfer Goal: Bed to Chair/Chair to Bed - Progress: Progressing toward goal PT Goal: Ambulate - Progress: Progressing toward goal  PT Treatment Precautions/Restrictions  Precautions Precautions: Fall;Other (comment) Required Braces or Orthoses: Yes Other Brace/Splint: cam boot RLE Restrictions Weight Bearing Restrictions: No Mobility (including Balance) Bed Mobility Rolling Left: 3: Mod assist Rolling Left Details (indicate cue type and reason): assist to bend RLE with cam boot; pt reaching with RUE to initiate upper body rolling Left Sidelying to Sit: 3: Mod assist;HOB elevated (comment degrees) Left Sidelying to Sit Details (indicate cue type and reason): HOB 20; pt able to assist with moving legs over EOB; pushed up with LUE against mattress Sitting - Scoot to Edge of Bed: 1: +1 Total assist Sitting - Scoot to Edge of Bed Details (indicate cue type and reason): using bed pad under pt's pelvis to assist her with  scooting Transfers Sit to Stand: 3: Mod assist;With upper extremity assist;From bed Sit to Stand Details (indicate cue type and reason): pt with forearm support for UEs and use of gait belt at waist; pt required assist to shift anteriorly and complete extension of legs Stand to Sit: 2: Max assist;Without upper extremity assist;To chair/3-in-1 Stand to Sit Details: required assist to control descent Stand Pivot Transfers: 3: Mod assist Stand Pivot Transfer Details (indicate cue type and reason): assist to wt-shift wt onto RLE and pt independently advancing LLE for ~ 4 steps to chair Ambulation/Gait Ambulation/Gait: Yes Ambulation/Gait Assistance: 3: Mod assist Ambulation/Gait Assistance Details (indicate cue type and reason): pivotal steps to chair (see above) Ambulation Distance (Feet): 1 Feet Assistive device: None (forearm support in front of pt) Gait Pattern: Decreased stride length;Right flexed knee in stance;Shuffle  Posture/Postural Control Posture/Postural Control: Postural limitations Postural Limitations: mildly kyphotic Balance Balance Assessed: Yes Static Sitting Balance Static Sitting - Balance Support: Bilateral upper extremity supported Static Sitting - Level of Assistance: 4: Min assist Static Sitting - Comment/# of Minutes: 5 minutes; minguard assist for safety;  Static Standing Balance Static Standing - Balance Support: Bilateral upper extremity supported (forearm/shelf support by PT) Static Standing - Level of Assistance: 3: Mod assist Static Standing - Comment/# of Minutes: 2 minutes; including time to pivot to chair Exercise    End of Session PT - End of Session Equipment Utilized During Treatment: Gait belt Activity Tolerance: Patient limited by pain;Treatment limited secondary to medical complications (Comment) (incr HR with activity) Patient left: in chair;with call bell in reach;with family/visitor present Nurse Communication: Mobility status for  transfers General Behavior During Session: Other (comment) (anxious re: arm pain, location of her friend) Cognition: Impaired (perseverating on arm pain; decr memory)  [name] 09/30/2011, 11:36 AM Pager [phone]
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
## 2011-09-30 NOTE — Progress Notes (Signed)
UR of chart completed.  
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
## 2011-09-30 NOTE — Progress Notes (Signed)
Patient ID: [name], female   DOB: 1923-07-05, 78 y.o.   MRN: [id number] Stable L3 fracture mobilizing slowly. No new changes.
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
## 2011-10-01 LAB — BASIC METABOLIC PANEL
CO2: 23 mEq/L (ref 19–32)
Calcium: 8.5 mg/dL (ref 8.4–10.5)
Glucose, Bld: 102 mg/dL — ABNORMAL HIGH (ref 70–99)
Potassium: 4.1 mEq/L (ref 3.5–5.1)
Sodium: 131 mEq/L — ABNORMAL LOW (ref 135–145)

## 2011-10-01 LAB — PROTIME-INR: INR: 1.7 — ABNORMAL HIGH (ref 0.00–1.49)

## 2011-10-01 LAB — HEMOGLOBIN AND HEMATOCRIT, BLOOD
HCT: 27.3 % — ABNORMAL LOW (ref 36.0–46.0)
Hemoglobin: 9.2 g/dL — ABNORMAL LOW (ref 12.0–15.0)

## 2011-10-01 LAB — CBC
Hemoglobin: 7.2 g/dL — ABNORMAL LOW (ref 12.0–15.0)
MCH: 28.7 pg (ref 26.0–34.0)
RBC: 2.51 MIL/uL — ABNORMAL LOW (ref 3.87–5.11)

## 2011-10-01 LAB — PREPARE RBC (CROSSMATCH)

## 2011-10-01 NOTE — Progress Notes (Signed)
One unit of RBC has been given.Pt's vitals has been stable & completed it without any complications.

## 2011-10-01 NOTE — Progress Notes (Signed)
  Subjective: No complaints.  Tolerating regular diet.  Only c/o right foot pain.  Objective: Vital signs in last 24 hours: Temp:  [97.8 F (36.6 C)-99.3 F (37.4 C)] 97.8 F (36.6 C) (02/05 0600) Pulse Rate:  [61-126] 61  (02/05 0600) Resp:  [16-25] 18  (02/05 0600) BP: (88-156)/(36-120) 110/74 mmHg (02/05 0600) SpO2:  [92 %-100 %] 98 % (02/05 0600) Last BM Date: 09/27/11  Intake/Output from previous day: 02/04 0701 - 02/05 0700 In: 430 [P.O.:300; I.V.:130] Out: 1050 [Urine:1050] Intake/Output this shift:    General appearance: alert, cooperative and no distress Head: soft tissue swelling and lacerations, with bruising Neck: supple, symmetrical, trachea midline and JVD noted Resp: bilat expiratory wheezing, no rales Cardio: normal rate, irregular GI: soft, non-tender; bowel sounds normal; no masses,  no organomegaly Extremities: right elbow swelling and edema with bruising, full mobility, right LE cast Skin: thin skin, bruising and ecchymosis diffusely Neurologic: Grossly normal  Lab Results:   Basename 10/01/11 0644 09/30/11 0900  WBC 6.3 10.4  HGB 7.2* 8.7*  HCT 21.8* 25.7*  PLT 144* 188   BMET  Basename 10/01/11 0644 09/30/11 0900  NA 131* 131*  K 4.1 4.0  CL 102 99  CO2 23 22  GLUCOSE 102* 158*  BUN 15 11  CREATININE 0.92 0.74  CALCIUM 8.5 8.6   PT/INR  Basename 10/01/11 0644 09/30/11 0500  LABPROT 20.3* 21.7*  INR 1.70* 1.85*   ABG No results found for this basename: PHART:2,PCO2:2,PO2:2,HCO3:2 in the last 72 hours  Studies/Results: Dg Forearm Right  09/30/2011  *RADIOLOGY REPORT*  Clinical Data: Pain and bruising and swelling secondary to trauma.  RIGHT FOREARM - 2 VIEW  Comparison: None.  Findings: There is prominent soft tissue swelling of the proximal forearm.  There is osteopenia.  There is no fracture or dislocation.  IMPRESSION:  No acute osseous abnormalities.  Osteopenia.  Original Report Authenticated By: Gwynn Burly, M.D.   Dg  Wrist 2 Views Right  09/30/2011  *RADIOLOGY REPORT*  Clinical Data: Right wrist pain after trauma.  RIGHT WRIST - 2 VIEW  Comparison: None.  Findings: There is no fracture or dislocation.  The patient has chondrocalcinosis with severe arthritis at the first carpal metacarpal joint and between the scaphoid and trapezium and trapezoid.  There is also arthritis between the hamate and the base of the fifth metacarpal. There is also severe arthritis of the PIP joint of the little finger  IMPRESSION: No acute osseous abnormality.  Arthritic changes.  Original Report Authenticated By: Gwynn Burly, M.D.    Anti-infectives: Anti-infectives    None      Assessment/Plan: s/p * No surgery found * HGB down again today, will tranfuse another 1 unit RBC and reevaluate.  No evidence of intraabdominal bleeding, bleeding in soft tissues??  Continue to hold coumadin for now.    LOS: 4 days    [name] DAVID 10/01/2011
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
## 2011-10-01 NOTE — Progress Notes (Signed)
Subjective:  Patient had a reasonable night.  No chest pain or dyspnea.  Objective:  Vital Signs in the last 24 hours: Temp:  [97.8 F (36.6 C)-99.3 F (37.4 C)] 97.8 F (36.6 C) (02/05 0600) Pulse Rate:  [61-126] 61  (02/05 0600) Resp:  [16-25] 18  (02/05 0600) BP: (88-156)/(36-120) 110/74 mmHg (02/05 0600) SpO2:  [92 %-100 %] 98 % (02/05 0600)  Intake/Output from previous day: 02/04 0701 - 02/05 0700 In: 430 [P.O.:300; I.V.:130] Out: 1050 [Urine:1050] Intake/Output from this shift:       . benazepril  10 mg Oral BID  . docusate sodium  200 mg Oral Daily  . Flora-Q  1 capsule Oral Daily  . hydrALAZINE  50 mg Oral TID  . levothyroxine  50 mcg Oral QAC breakfast  . LORazepam  0.5 mg Oral Q8H  . metoprolol tartrate  50 mg Oral BID  . pantoprazole  40 mg Oral Q1200  . polyvinyl alcohol  2 drop Right Eye Q4H while awake  . simvastatin  40 mg Oral q1800  . traZODone  75 mg Oral QHS  . DISCONTD: metoprolol tartrate  25 mg Oral BID  . DISCONTD: pantoprazole (PROTONIX) IV  40 mg Intravenous Q1200      . dextrose 5 % and 0.9 % NaCl with KCl 20 mEq/L 20 mL/hr at 10/01/11 9604    Physical Exam: The patient appears to be in no distress.  Head and neck exam reveals that the pupils are equal and reactive. Scalp reveals large sutured laceration. The extraocular movements are full.  There is no scleral icterus.  Mouth and pharynx are benign.  No lymphadenopathy.  No carotid bruits.  The jugular venous pressure is normal.  Thyroid is not enlarged or tender.  Chest is clear to percussion and auscultation.  Minimal inspiratory rales.  Expansion of the chest is symmetrical.  Heart reveals no abnormal lift or heave.  First and second heart sounds are normal.  There is no murmur gallop rub or click.Rhythm is irregular. The abdomen is soft and nontender.  Bowel sounds are normoactive.  There is no hepatosplenomegaly or mass.  There are no abdominal bruits.  Extremities reveal right  leg in soft immobilizing splint.  Neurologic exam is normal strength and no lateralizing weakness.  No sensory deficits.  Integument reveals no rash  Lab Results:  Basename 10/01/11 0644 09/30/11 0900  WBC 6.3 10.4  HGB 7.2* 8.7*  PLT 144* 188    Basename 09/30/11 0900 09/29/11 0515  NA 131* 133*  K 4.0 4.5  CL 99 101  CO2 22 22  GLUCOSE 158* 122*  BUN 11 13  CREATININE 0.74 0.78   No results found for this basename: TROPONINI:2,CK,MB:2 in the last 72 hours Hepatic Function Panel No results found for this basename: PROT,ALBUMIN,AST,ALT,ALKPHOS,BILITOT,BILIDIR,IBILI in the last 72 hours No results found for this basename: CHOL in the last 72 hours No results found for this basename: PROTIME in the last 72 hours  Imaging: Imaging results have been reviewed  Cardiac Studies: Telemetry shows atrial fib with controlled ventricular response. Assessment/Plan:  Patient Active Hospital Problem List: ** HTN (hypertension) (11/13/2010)   Assessment: Normotensive   Plan: continue lotensin, hydralazine, metoprolol. * Anemia associated with acute blood loss (09/27/2011)   Assessment: Further drop in Hgb to 7.2   Plan: Consider transfusion.   Anticoagulated on warfarin (09/27/2011)   Assessment: Off coumadin at present.   Plan: Restart  Coumadin when okay with trauma team  Atrial fibrillation  with RVR (09/30/2011)   Assessment:Rate is now controlled at rest   Plan: Continue metoprolol 50 mg BID   LOS: 4 days    [name] 10/01/2011, 7:58 AM

## 2011-10-01 NOTE — Progress Notes (Signed)
Subjective:  Pt denies any significant foot or arm pain this morning.  O/w doing well with no c/o.  Objective: Vital signs in last 24 hours: Temp:  [97.8 F (36.6 C)-99.3 F (37.4 C)] 98.6 F (37 C) (02/05 0002) Pulse Rate:  [69-126] 96  (02/05 0002) Resp:  [16-25] 20  (02/05 0002) BP: (88-156)/(36-120) 156/67 mmHg (02/05 0002) SpO2:  [92 %-100 %] 96 % (02/05 0002)  Intake/Output from previous day: 02/04 0701 - 02/05 0700 In: 430 [P.O.:300; I.V.:130] Out: 1050 [Urine:1050] Intake/Output this shift:     Basename 10/01/11 0644 09/30/11 0900 09/29/11 0515 09/28/11 1258  HGB 7.2* 8.7* 6.3* 7.0*    Basename 10/01/11 0644 09/30/11 0900  WBC 6.3 10.4  RBC 2.51* 3.00*  HCT 21.8* 25.7*  PLT 144* 188    Basename 09/30/11 0900 09/29/11 0515  NA 131* 133*  K 4.0 4.5  CL 99 101  CO2 22 22  BUN 11 13  CREATININE 0.74 0.78  GLUCOSE 158* 122*  CALCIUM 8.6 8.6    Basename 10/01/11 0644 09/30/11 0500  LABPT -- --  INR 1.70* 1.85*   PE:  R foot wounds dressed and dry.  R UE contusion unchanged from yesterday.  SKin intact.  Ecchymosis present  Xray:  R forearm and wrist films neg for fx.   Assessment/Plan: R UE contusion - OT for ROM of UE.  No restrictions. R hallux fx, R foot crush injury - dry dressing to R foot daily and as needed.  Pt may bear weight as tolerated in cam boot.  Fx blisters and soft tissue injuury will likely take weeks to months to resolve.  Local wound care is the appropriate treatment.  Fxs of the hallux should heal with immobilization.   Toni Arthurs 10/01/2011, 7:34 AM

## 2011-10-02 LAB — TYPE AND SCREEN
ABO/RH(D): O POS
Antibody Screen: NEGATIVE
Unit division: 0
Unit division: 0

## 2011-10-02 LAB — CBC
Platelets: 206 10*3/uL (ref 150–400)
RDW: 15.2 % (ref 11.5–15.5)
WBC: 8.8 10*3/uL (ref 4.0–10.5)

## 2011-10-02 MED ORDER — FLEET ENEMA 7-19 GM/118ML RE ENEM
1.0000 | ENEMA | Freq: Once | RECTAL | Status: AC
  Administered 2011-10-02: 1 via RECTAL
  Filled 2011-10-02: qty 1

## 2011-10-02 MED ORDER — BETHANECHOL CHLORIDE 10 MG PO TABS
10.0000 mg | ORAL_TABLET | Freq: Four times a day (QID) | ORAL | Status: DC
  Administered 2011-10-02 – 2011-10-03 (×6): 10 mg via ORAL
  Filled 2011-10-02 (×8): qty 1

## 2011-10-02 NOTE — Progress Notes (Signed)
Subjective: Pt awake and alert but still fairly confused.  Denies pain in foot or elbow.   Objective: Vital signs in last 24 hours: Temp:  [97.6 F (36.4 C)-99.6 F (37.6 C)] 97.9 F (36.6 C) (02/06 1100) Pulse Rate:  [71-99] 80  (02/06 1100) Resp:  [18-20] 18  (02/06 1100) BP: (103-151)/(56-71) 103/61 mmHg (02/06 1100) SpO2:  [94 %-96 %] 96 % (02/06 1100)  Intake/Output from previous day: 02/05 0701 - 02/06 0700 In: -  Out: 2325 [Urine:2325] Intake/Output this shift:     Basename 10/02/11 1015 10/01/11 2047 10/01/11 0644 09/30/11 0900  HGB 9.1* 9.2* 7.2* 8.7*    Basename 10/02/11 1015 10/01/11 2047 10/01/11 0644  WBC 8.8 -- 6.3  RBC 3.13* -- 2.51*  HCT 27.1* 27.3* --  PLT 206 -- 144*    Basename 10/01/11 0644 09/30/11 0900  NA 131* 131*  K 4.1 4.0  CL 102 99  CO2 23 22  BUN 15 11  CREATININE 0.92 0.74  GLUCOSE 102* 158*  CALCIUM 8.5 8.6    Basename 10/01/11 0644 09/30/11 0500  LABPT -- --  INR 1.70* 1.85*    PE:  R foot with dorsal blisters, swelling and ecchymosis.  No signs of infection.  R elbow with resolving hematoma.  Assessment/Plan: R foot crush injury / hallux fractures:  Continue WBAT in cam boot.  Dry dressing with telfa over blisters daily and as needed.  R elbow contusion:  ROM and WB as tolerate on R UE.    Pt should follow up with me in 2-3 weeks for a wound check.  I'll sign off now.  Please call with any questions.   [phone].   [name] 10/02/2011, 1:40 PM

## 2011-10-02 NOTE — Progress Notes (Signed)
Physical Therapy Treatment Patient Details Name: [name] MRN: [id number] DOB: [birth year] Today's Date: 10/02/2011  PT Assessment/Plan  PT - Assessment/Plan Comments on Treatment Session: Pt continues to have anxiety through out session and needs reassurance to task.  Cues for breathing/relaxation techinques to calm pt.  Pt improving overall ablitiy to stand upright and accept weight through right LE. PT Plan: Frequency remains appropriate;Discharge plan needs to be updated PT Frequency: Min 4X/week Recommendations for Other Services: Rehab consult Follow Up Recommendations: Inpatient Rehab Equipment Recommended: Defer to next venue PT Goals  Acute Rehab PT Goals PT Goal Formulation: With patient/family Time For Goal Achievement: 7 days Pt will go Supine/Side to Sit: with supervision;with HOB 0 degrees PT Goal: Supine/Side to Sit - Progress: Met Pt will go Sit to Stand: with min assist;with upper extremity assist PT Goal: Sit to Stand - Progress: Progressing toward goal Pt will go Stand to Sit: with min assist;with upper extremity assist PT Goal: Stand to Sit - Progress: Progressing toward goal Pt will Transfer Bed to Chair/Chair to Bed: with min assist PT Transfer Goal: Bed to Chair/Chair to Bed - Progress: Progressing toward goal  PT Treatment Precautions/Restrictions  Precautions Precautions: Fall;Back Required Braces or Orthoses: Yes Other Brace/Splint: cam boot RLE Restrictions Weight Bearing Restrictions: No (WBAT on Rt LE) Mobility (including Balance) Bed Mobility Bed Mobility: Yes Rolling Left: 5: Supervision Rolling Left Details (indicate cue type and reason): Supervision for safety with cues for technique Supine to Sit: 5: Supervision;HOB elevated (Comment degrees) (hOB 10 degrees) Supine to Sit Details (indicate cue type and reason): Supervision for safety with cues technique Sitting - Scoot to Edge of Bed: 3: Mod assist;With rail (pad used to (A) pt states  pain with Rt Ue with tasks) Sitting - Scoot to Edge of Bed Details (indicate cue type and reason): (A) to complete scooting EOB Transfers Transfers: Yes Sit to Stand: With upper extremity assist;From bed;1: +2 Total assist;Patient percentage (comment) (pt 60%) Sit to Stand Details (indicate cue type and reason): Pt required max verbal cues and visual cues, pursed lip breathing education, facilitation at sternum and sacrum for upright posture. Pt facilitation of weight shift onto Lt Le to decrease pain on Rt LE. Pt standing ~5 minutes static.  Stand to Sit: 2: Max assist;With upper extremity assist;To chair/3-in-1 Stand to Sit Details: (A) to slowly descend to recliner with cues for technique. Stand Pivot Transfers: 1: +2 Total assist;Patient percentage (comment) (pt 60%) Stand Pivot Transfer Details (indicate cue type and reason): (A) for proper weight shifts to advance LE toward recliner and (A) with hips to recliner. Ambulation/Gait Ambulation/Gait: No  Posture/Postural Control Posture/Postural Control: Postural limitations Postural Limitations: mildly kyphotic Balance Balance Assessed: Yes Static Sitting Balance Static Sitting - Balance Support: Bilateral upper extremity supported Static Sitting - Level of Assistance: 4: Min assist Static Sitting - Comment/# of Minutes: initial min (A) for safety due to increase pain sitting EOB.  Pt progressed to stand by assistance for ~10 minutes while speaking with MD. Static Standing Balance Static Standing - Balance Support: Bilateral upper extremity supported Static Standing - Level of Assistance: 1: +2 Total assist;Patient percentage (comment) (pt 70%) Static Standing - Comment/# of Minutes: ~5 minutes while weight shifting to left to unweight right side due to increase in pain. Exercise    End of Session PT - End of Session Equipment Utilized During Treatment: Gait belt Activity Tolerance: Patient limited by pain;Treatment limited secondary  to medical complications (Comment) Patient left: in chair;with  call bell in reach;with family/visitor present Nurse Communication: Mobility status for transfers General Behavior During Session: [facility] for tasks performed Cognition: Impaired Rancho Level of Function:  (VI -confused appropriate)  [name] 10/02/2011, 1:49 PM [phone]

## 2011-10-02 NOTE — Progress Notes (Signed)
Sutures on Rt forehead removed , wound approximated and sterile strips applied per order.

## 2011-10-02 NOTE — Progress Notes (Signed)
Subjective:  Patient had a reasonable night.  No chest pain or dyspnea. Received packed cells yesterday.  Objective:  Vital Signs in the last 24 hours: Temp:  [97.5 F (36.4 C)-99.6 F (37.6 C)] 98.7 F (37.1 C) (02/06 0600) Pulse Rate:  [67-99] 85  (02/06 0600) Resp:  [18-20] 18  (02/06 0600) BP: (114-151)/(54-71) 115/63 mmHg (02/06 0600) SpO2:  [94 %-97 %] 94 % (02/06 0600)  Intake/Output from previous day: 02/05 0701 - 02/06 0700 In: -  Out: 2325 [Urine:2325] Intake/Output from this shift:       . benazepril  10 mg Oral BID  . docusate sodium  200 mg Oral Daily  . Flora-Q  1 capsule Oral Daily  . hydrALAZINE  50 mg Oral TID  . levothyroxine  50 mcg Oral QAC breakfast  . LORazepam  0.5 mg Oral Q8H  . metoprolol tartrate  50 mg Oral BID  . pantoprazole  40 mg Oral Q1200  . polyvinyl alcohol  2 drop Right Eye Q4H while awake  . simvastatin  40 mg Oral q1800  . traZODone  75 mg Oral QHS      . dextrose 5 % and 0.9 % NaCl with KCl 20 mEq/L 20 mL/hr at 10/01/11 4098    Physical Exam: The patient appears to be in no distress.  Head and neck exam reveals that the pupils are equal and reactive. Scalp reveals large sutured laceration. The extraocular movements are full.  There is no scleral icterus.  Mouth and pharynx are benign.  No lymphadenopathy.  No carotid bruits.  The jugular venous pressure is normal.  Thyroid is not enlarged or tender.  Chest is clear to percussion and auscultation.  Minimal inspiratory rales.  Expansion of the chest is symmetrical.  Heart reveals no abnormal lift or heave.  First and second heart sounds are normal.  There is no murmur gallop rub or click.Rhythm is irregular. The abdomen is soft and nontender.  Bowel sounds are normoactive.  There is no hepatosplenomegaly or mass.  There are no abdominal bruits.  Extremities reveal right leg in soft immobilizing splint.  Neurologic exam is normal strength and no lateralizing weakness.  No  sensory deficits.  Integument reveals no rash  Lab Results:  Basename 10/01/11 2047 10/01/11 0644 09/30/11 0900  WBC -- 6.3 10.4  HGB 9.2* 7.2* --  PLT -- 144* 188    Basename 10/01/11 0644 09/30/11 0900  NA 131* 131*  K 4.1 4.0  CL 102 99  CO2 23 22  GLUCOSE 102* 158*  BUN 15 11  CREATININE 0.92 0.74   No results found for this basename: TROPONINI:2,CK,MB:2 in the last 72 hours Hepatic Function Panel No results found for this basename: PROT,ALBUMIN,AST,ALT,ALKPHOS,BILITOT,BILIDIR,IBILI in the last 72 hours No results found for this basename: CHOL in the last 72 hours No results found for this basename: PROTIME in the last 72 hours  Imaging: Imaging results have been reviewed  Cardiac Studies: Telemetry shows atrial fib with controlled ventricular response. Assessment/Plan:  Patient Active Hospital Problem List: ** HTN (hypertension) (11/13/2010)   Assessment: Normotensive   Plan: continue lotensin, hydralazine, metoprolol. * Anemia associated with acute blood loss (09/27/2011)   Assessment: Improved after transfusion yesterday.  Hgb last night 9.2   Plan: Continue to monitor.   Anticoagulated on warfarin (09/27/2011)   Assessment: Off coumadin at present.   Plan: Restart  Coumadin when okay with trauma team  Atrial fibrillation with RVR (09/30/2011)   Assessment:Rate is now controlled  at rest   Plan: Continue metoprolol 50 mg BID   LOS: 5 days    [name] 10/02/2011, 8:02 AM

## 2011-10-02 NOTE — Progress Notes (Signed)
This patient has been seen and I agree with the findings and treatment plan.  Herley Bernardini O. Bunny Lowdermilk, III, MD, FACS [phone] (pager) [phone] (direct pager) Trauma Surgeon  

## 2011-10-02 NOTE — Progress Notes (Signed)
Clinical Social Worker spoke with patient and patient caregiver [name] at the bedside.  CSW contacted [facility]	[place] for patient rehab options.  [facility] is agreeable to have patient when she is medically ready.  Patient caregiver will relay message to patient daughter once she arrives.  Patient understands and is agreeable with discharge plan.  PA notified.    Clinical Social Worker will follow up with patient and patient family to facilitate patient discharge needs.  [address] [place], [place] [phone]
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
## 2011-10-02 NOTE — Progress Notes (Signed)
Occupational Therapy Treatment Patient Details Name: [name] MRN: [id number] DOB: 1922-12-16 Today's Date: 10/02/2011  OT Assessment/Plan OT Assessment/Plan Comments on Treatment Session: Pt progressing during session with bed mobility. Pt c/o pain in Rt Ue with mobility and Rt LE with weight bearing. pt with decreased edema and bruising in Rt UE compared to evaluation. Pt following simple commands demonstrating Rancho Level VI OT Plan: Discharge plan remains appropriate OT Frequency: Min 2X/week Recommendations for Other Services: Rehab consult Follow Up Recommendations: Inpatient Rehab Equipment Recommended: Defer to next venue OT Goals Acute Rehab OT Goals OT Goal Formulation: With patient/family Time For Goal Achievement: 2 weeks ADL Goals Pt Will Perform Grooming: with set-up;Sitting, chair;Supported ADL Goal: Grooming - Progress: Progressing toward goals Pt Will Perform Upper Body Bathing: with min assist;Sitting, chair;Supported ADL Goal: Product manager - Progress: Not met Pt Will Perform Upper Body Dressing: with min assist;Sitting, chair;Supported ADL Goal: Patent attorney - Progress: Not met Pt Will Transfer to Toilet: with max assist;3-in-1;Stand pivot transfer ADL Goal: Toilet Transfer - Progress: Progressing toward goals Miscellaneous OT Goals Miscellaneous OT Goal #1: Pt supine<>sit for bed mobility with min v/c with hOB 20 degrees or less at Min A level as precursor to ADLS OT Goal: Miscellaneous Goal #1 - Progress: Met Miscellaneous OT Goal #2: Pt will follow two step command 2 out 4 trials demonstrating increased attention and cognition. OT Goal: Miscellaneous Goal #2 - Progress: Progressing toward goals Miscellaneous OT Goal #3: Pt will verbalize 3 out 3 injuries (head, Rt UE and Rt Foot) demonstrating Rancho Coma recovery Level VII recalling education as precursor to adls OT Goal: Miscellaneous Goal #3 - Progress: Progressing toward goals  OT  Treatment Precautions/Restrictions  Precautions Precautions: Fall;Back Required Braces or Orthoses: Yes Other Brace/Splint: cam boot RLE Restrictions Weight Bearing Restrictions: No (WBAT on Rt LE)   ADL ADL Toilet Transfer: Simulated;+2 Total assistance;Comment for patient % (pt 60 %) Toilet Transfer Details (indicate cue type and reason): Pt requried facilitation for weight shift for transfer Toilet Transfer Method: Stand pivot (Lt side) Toilet Transfer Equipment: Raised toilet seat with arms (or 3-in-1 over toilet) Toileting - Clothing Manipulation: Simulated;+1 Total assistance Where Assessed - Toileting Clothing Manipulation: Sit to stand from 3-in-1 or toilet Toileting - Hygiene: Simulated;+1 Total assistance Where Assessed - Toileting Hygiene: Sit to stand from 3-in-1 or toilet Tub/Shower Transfer: Not assessed (MD suggested shower and pt states "NO- I am not ready yet") Equipment Used: Other (comment) (hand held (A)) Ambulation Related to ADLs: not attempted this session ADL Comments: Pt supine <>sit with increased anxiety at EOB and required visual and auditory cues max to decrease breathing. Pt breathing short quick breaths. Pt recalling MD names that have visited pt since admission. pt asking PA for trauma multiple questions concerning trauma staff. Pt repeating questions to MD that were previously answered demonstrating decreased problem solving and carry over of information. Mobility  Bed Mobility Bed Mobility: Yes Rolling Left: 5: Supervision Supine to Sit: 5: Supervision;HOB elevated (Comment degrees) (hOB 10 degrees) Sitting - Scoot to Edge of Bed: 3: Mod assist;With rail (pad used to (A) pt states pain with Rt Ue with tasks) Transfers Transfers: Yes Sit to Stand: With upper extremity assist;From bed;1: +2 Total assist;Patient percentage (comment) (pt 60%) Sit to Stand Details (indicate cue type and reason): Pt required max verbal cues and visual cues, pursed lip  breathing education, facilitation at sternum and sacrum for upright posture. Pt facilitation of weight shift onto Lt Le  to decrease pain on Rt LE. Pt standing ~5 minutes static.  Stand to Sit: 2: Max assist;With upper extremity assist;To chair/3-in-1 Exercises    End of Session OT - End of Session Equipment Utilized During Treatment: Gait belt Activity Tolerance: Patient limited by pain Patient left: in chair;with call bell in reach;with family/visitor present Thelma Barge caregiver) Nurse Communication: Mobility status for transfers General Behavior During Session: [facility] for tasks performed Cognition: Impaired Rancho Level of Function:  (VI -confused appropriate)  Lucile Shutters  10/02/2011, 1:36 PM Pager: [phone]

## 2011-10-03 ENCOUNTER — Encounter: Payer: Self-pay | Admitting: Internal Medicine

## 2011-10-03 MED ORDER — HYDROCODONE-ACETAMINOPHEN 5-325 MG PO TABS: 1.0000 | ORAL_TABLET | ORAL | Status: AC | PRN

## 2011-10-03 MED ORDER — LORAZEPAM 0.5 MG PO TABS: 0.5000 mg | ORAL_TABLET | Freq: Three times a day (TID) | ORAL | Status: AC

## 2011-10-03 MED ORDER — BETHANECHOL CHLORIDE 10 MG PO TABS: 10.0000 mg | ORAL_TABLET | Freq: Four times a day (QID) | ORAL | Status: AC
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
## 2011-10-03 NOTE — Progress Notes (Signed)
Subjective:  Patient had a reasonable night.  No chest pain or dyspnea. Received packed cells Tuesday.    Objective:  Vital Signs in the last 24 hours: Temp:  [97.4 F (36.3 C)-99.2 F (37.3 C)] 98 F (36.7 C) (02/07 0611) Pulse Rate:  [70-109] 98  (02/07 0611) Resp:  [18] 18  (02/07 0611) BP: (103-154)/(61-86) 122/72 mmHg (02/07 0611) SpO2:  [94 %-98 %] 97 % (02/07 0611)  Intake/Output from previous day: 02/06 0701 - 02/07 0700 In: 220 [P.O.:220] Out: 2500 [Urine:2500] Intake/Output from this shift:       . benazepril  10 mg Oral BID  . bethanechol  10 mg Oral QID  . docusate sodium  200 mg Oral Daily  . Flora-Q  1 capsule Oral Daily  . hydrALAZINE  50 mg Oral TID  . levothyroxine  50 mcg Oral QAC breakfast  . LORazepam  0.5 mg Oral Q8H  . metoprolol tartrate  50 mg Oral BID  . polyvinyl alcohol  2 drop Right Eye Q4H while awake  . simvastatin  40 mg Oral q1800  . sodium phosphate  1 enema Rectal Once  . traZODone  75 mg Oral QHS  . DISCONTD: pantoprazole  40 mg Oral Q1200      . DISCONTD: dextrose 5 % and 0.9 % NaCl with KCl 20 mEq/L 20 mL/hr at 10/01/11 1610    Physical Exam: The patient appears to be in no distress.  Head and neck exam reveals that the pupils are equal and reactive. Scalp reveals large sutured laceration. The extraocular movements are full.  There is no scleral icterus.  Mouth and pharynx are benign.  No lymphadenopathy.  No carotid bruits.  The jugular venous pressure is normal.  Thyroid is not enlarged or tender.  Chest is clear to percussion and auscultation.  Minimal inspiratory rales.  Expansion of the chest is symmetrical.  Heart reveals no abnormal lift or heave.  First and second heart sounds are normal.  There is no murmur gallop rub or click.Rhythm is irregular. The abdomen is soft and nontender.  Bowel sounds are normoactive.  There is no hepatosplenomegaly or mass.  There are no abdominal bruits.  Extremities reveal right leg in  soft immobilizing splint.  Neurologic exam is normal strength and no lateralizing weakness.  No sensory deficits.  Integument reveals no rash  Lab Results:  Basename 10/02/11 1015 10/01/11 2047 10/01/11 0644  WBC 8.8 -- 6.3  HGB 9.1* 9.2* --  PLT 206 -- 144*    Basename 10/01/11 0644  NA 131*  K 4.1  CL 102  CO2 23  GLUCOSE 102*  BUN 15  CREATININE 0.92   No results found for this basename: TROPONINI:2,CK,MB:2 in the last 72 hours Hepatic Function Panel No results found for this basename: PROT,ALBUMIN,AST,ALT,ALKPHOS,BILITOT,BILIDIR,IBILI in the last 72 hours No results found for this basename: CHOL in the last 72 hours No results found for this basename: PROTIME in the last 72 hours  Imaging: Imaging results have been reviewed  Cardiac Studies: Telemetry shows atrial fib with controlled ventricular response. Assessment/Plan:  Patient Active Hospital Problem List: ** HTN (hypertension) (11/13/2010)   Assessment: Normotensive   Plan: continue lotensin, hydralazine, metoprolol. * Anemia associated with acute blood loss (09/27/2011)   Assessment: Improved after transfusion yesterday.  Hgb last night 9.2   Plan: Continue to monitor.   Anticoagulated on warfarin (09/27/2011)   Assessment: Off coumadin at present.   Plan: Restart  Coumadin next week if stable.  Atrial fibrillation with RVR (09/30/2011)   Assessment:Rate is now controlled at rest   Plan: Continue metoprolol 50 mg BID  Anticipate possible return to SNF in [place] today. She will follow up with Dr. Mariah Milling in about 2 weeks   LOS: 6 days    Cassell Clement 10/03/2011, 9:16 AM

## 2011-10-03 NOTE — Progress Notes (Signed)
Clinical Social Worker facilitated patient discharge including contacting patient family and facility and arranging ambulance transport to [facility]	[place].  Patient daughter and caregiver at bedside and are prepared for patient transfer.  CSW provided RN with phone number to call report.    Clinical Social Worker will sign off for now as social work intervention is no longer needed. Please consult [place] again if new need arises.  [address] [place], [place] [phone]
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
## 2011-10-03 NOTE — Progress Notes (Addendum)
Physical Therapy Cancellation Patient Details Name: [name] MRN: [id number] DOB: 10/20/1922 Today's Date: 10/03/2011  Spoke with RN and pt to d/c to SNF momentarily.  Thanks!!!      Aarish Rockers 10/03/2011, 2:01 PM [phone]

## 2011-10-03 NOTE — Discharge Summary (Signed)
Okay to go to SNF today.  This patient has been seen and I agree with the findings and treatment plan.  Marta Lamas. Gae Bon, MD, FACS [phone] (pager) [phone] (direct pager) Trauma Surgeon

## 2011-10-03 NOTE — Progress Notes (Signed)
This patient has been seen and I agree with the findings and treatment plan.  Shakeeta Godette O. Shota Kohrs, III, MD, FACS [phone] (pager) [phone] (direct pager) Trauma Surgeon  

## 2011-10-10 LAB — CBC WITH DIFFERENTIAL/PLATELET
Basophil %: 0.3 %
Eosinophil %: 2.1 %
HGB: 8.4 g/dL — ABNORMAL LOW (ref 12.0–16.0)
Lymphocyte #: 1.5 10*3/uL (ref 1.0–3.6)
MCH: 28.8 pg (ref 26.0–34.0)
MCV: 88 fL (ref 80–100)
Monocyte #: 0.9 10*3/uL — ABNORMAL HIGH (ref 0.0–0.7)
Platelet: 261 10*3/uL (ref 150–440)
RBC: 2.94 10*6/uL — ABNORMAL LOW (ref 3.80–5.20)

## 2011-10-12 LAB — URINALYSIS, COMPLETE
Glucose,UR: NEGATIVE mg/dL (ref 0–75)
Nitrite: NEGATIVE
Protein: NEGATIVE

## 2011-10-13 ENCOUNTER — Inpatient Hospital Stay: Payer: Self-pay | Admitting: Internal Medicine

## 2011-10-13 LAB — URINALYSIS, COMPLETE
Bilirubin,UR: NEGATIVE
Blood: NEGATIVE
Glucose,UR: NEGATIVE mg/dL (ref 0–75)
Ketone: NEGATIVE
Specific Gravity: 1.008 (ref 1.003–1.030)
Squamous Epithelial: NONE SEEN

## 2011-10-13 LAB — COMPREHENSIVE METABOLIC PANEL
Anion Gap: 12 (ref 7–16)
BUN: 21 mg/dL — ABNORMAL HIGH (ref 7–18)
Bilirubin,Total: 0.7 mg/dL (ref 0.2–1.0)
Chloride: 89 mmol/L — ABNORMAL LOW (ref 98–107)
EGFR (African American): >60
Osmolality: 255 (ref 275–301)
Potassium: 4.2 mmol/L (ref 3.5–5.1)
SGOT(AST): 31 U/L (ref 15–37)
Sodium: 125 mmol/L — ABNORMAL LOW (ref 136–145)
Total Protein: 6.4 g/dL (ref 6.4–8.2)

## 2011-10-13 LAB — CBC WITH DIFFERENTIAL/PLATELET
Basophil #: 0 10*3/uL (ref 0.0–0.1)
Eosinophil #: 0.1 10*3/uL (ref 0.0–0.7)
HCT: 29.1 % — ABNORMAL LOW (ref 35.0–47.0)
Lymphocyte %: 14.2 %
MCHC: 33.3 g/dL (ref 32.0–36.0)
Neutrophil #: 7.5 10*3/uL — ABNORMAL HIGH (ref 1.4–6.5)
RDW: 16.2 % — ABNORMAL HIGH (ref 11.5–14.5)

## 2011-10-13 LAB — TROPONIN I: Troponin-I: <0.02 ng/mL

## 2011-10-13 LAB — PROTIME-INR
INR: 1
Prothrombin Time: 13.6 secs (ref 11.5–14.7)

## 2011-10-14 LAB — CBC WITH DIFFERENTIAL/PLATELET
Basophil %: 0.9 %
Eosinophil #: 0.1 10*3/uL (ref 0.0–0.7)
Eosinophil %: 2.7 %
HCT: 26.9 % — ABNORMAL LOW (ref 35.0–47.0)
HGB: 8.8 g/dL — ABNORMAL LOW (ref 12.0–16.0)
Lymphocyte %: 18.8 %
MCHC: 32.7 g/dL (ref 32.0–36.0)
Monocyte %: 9.9 %
Neutrophil %: 67.7 %
RBC: 3 10*6/uL — ABNORMAL LOW (ref 3.80–5.20)
WBC: 5.5 10*3/uL (ref 3.6–11.0)

## 2011-10-14 LAB — COMPREHENSIVE METABOLIC PANEL
SGOT(AST): 25 U/L (ref 15–37)
Total Protein: 5.7 g/dL — ABNORMAL LOW (ref 6.4–8.2)

## 2011-10-14 LAB — BASIC METABOLIC PANEL
Calcium, Total: 8 mg/dL — ABNORMAL LOW (ref 8.5–10.1)
Co2: 22 mmol/L (ref 21–32)
Osmolality: 258 (ref 275–301)
Potassium: 4 mmol/L (ref 3.5–5.1)
Sodium: 129 mmol/L — ABNORMAL LOW (ref 136–145)

## 2011-10-14 LAB — URINE CULTURE

## 2011-10-15 LAB — BASIC METABOLIC PANEL
Anion Gap: 9 (ref 7–16)
Calcium, Total: 8.4 mg/dL — ABNORMAL LOW (ref 8.5–10.1)
Chloride: 96 mmol/L — ABNORMAL LOW (ref 98–107)
Co2: 21 mmol/L (ref 21–32)
Creatinine: 0.86 mg/dL (ref 0.60–1.30)
EGFR (African American): >60
Osmolality: 251 (ref 275–301)

## 2011-10-15 LAB — URINE CULTURE

## 2011-10-15 LAB — HEMOGLOBIN: HGB: 9.3 g/dL — ABNORMAL LOW (ref 12.0–16.0)

## 2011-10-16 ENCOUNTER — Encounter: Payer: Self-pay | Admitting: Internal Medicine

## 2011-10-17 LAB — CBC WITH DIFFERENTIAL/PLATELET
Basophil #: 0.1 10*3/uL (ref 0.0–0.1)
Basophil %: 1 %
Eosinophil #: 0.2 10*3/uL (ref 0.0–0.7)
HCT: 28.2 % — ABNORMAL LOW (ref 35.0–47.0)
HGB: 9.3 g/dL — ABNORMAL LOW (ref 12.0–16.0)
Lymphocyte %: 24.6 %
MCHC: 32.8 g/dL (ref 32.0–36.0)
MCV: 90 fL (ref 80–100)
Neutrophil #: 4.1 10*3/uL (ref 1.4–6.5)

## 2011-10-18 ENCOUNTER — Encounter: Payer: Self-pay | Admitting: *Deleted

## 2011-10-18 LAB — CULTURE, BLOOD (SINGLE)

## 2011-10-21 ENCOUNTER — Encounter: Payer: Medicare Other | Admitting: Cardiovascular Disease

## 2011-10-22 LAB — BASIC METABOLIC PANEL
Anion Gap: 11 (ref 7–16)
BUN: 17 mg/dL (ref 7–18)
Calcium, Total: 8.5 mg/dL (ref 8.5–10.1)
Chloride: 99 mmol/L (ref 98–107)
Co2: 27 mmol/L (ref 21–32)
Osmolality: 274 (ref 275–301)
Potassium: 4.7 mmol/L (ref 3.5–5.1)

## 2011-10-22 LAB — HEMOGLOBIN: HGB: 9.2 g/dL — ABNORMAL LOW (ref 12.0–16.0)

## 2011-10-25 ENCOUNTER — Encounter: Payer: Self-pay | Admitting: Internal Medicine

## 2011-11-03 LAB — URINALYSIS, COMPLETE
Bacteria: NONE SEEN
Bilirubin,UR: NEGATIVE
Glucose,UR: NEGATIVE mg/dL (ref 0–75)
Leukocyte Esterase: NEGATIVE
Nitrite: NEGATIVE
Ph: 7 (ref 4.5–8.0)
RBC,UR: 1 /HPF (ref 0–5)

## 2011-11-04 LAB — URINE CULTURE

## 2011-11-21 LAB — CBC WITH DIFFERENTIAL/PLATELET
Basophil %: 1.7 %
Eosinophil #: 0.5 10*3/uL (ref 0.0–0.7)
Eosinophil %: 8.7 %
HCT: 29.4 % — ABNORMAL LOW (ref 35.0–47.0)
HGB: 9.4 g/dL — ABNORMAL LOW (ref 12.0–16.0)
MCHC: 32 g/dL (ref 32.0–36.0)
MCV: 92 fL (ref 80–100)
Monocyte #: 0.7 10*3/uL (ref 0.0–0.7)
RBC: 3.18 10*6/uL — ABNORMAL LOW (ref 3.80–5.20)
RDW: 17.6 % — ABNORMAL HIGH (ref 11.5–14.5)

## 2011-11-21 LAB — BASIC METABOLIC PANEL
Anion Gap: 10 (ref 7–16)
BUN: 27 mg/dL — ABNORMAL HIGH (ref 7–18)
Co2: 27 mmol/L (ref 21–32)
Creatinine: 1.03 mg/dL (ref 0.60–1.30)
EGFR (African American): >60
Osmolality: 280 (ref 275–301)
Potassium: 4.2 mmol/L (ref 3.5–5.1)
Sodium: 138 mmol/L (ref 136–145)

## 2011-11-21 LAB — TSH: Thyroid Stimulating Horm: 3.61 u[IU]/mL

## 2011-11-21 LAB — PROTIME-INR: Prothrombin Time: 33 secs — ABNORMAL HIGH (ref 11.5–14.7)

## 2011-11-25 ENCOUNTER — Encounter: Payer: Self-pay | Admitting: Internal Medicine

## 2011-12-25 ENCOUNTER — Encounter: Payer: Self-pay | Admitting: Internal Medicine

## 2012-01-08 ENCOUNTER — Ambulatory Visit (INDEPENDENT_AMBULATORY_CARE_PROVIDER_SITE_OTHER): Payer: Medicare Other | Admitting: Cardiovascular Disease

## 2012-01-08 ENCOUNTER — Encounter: Payer: Self-pay | Admitting: Cardiovascular Disease

## 2012-01-08 VITALS — BP 142/80 | HR 69 | Ht 65.0 in | Wt 122.0 lb

## 2012-01-08 DIAGNOSIS — I4891 Unspecified atrial fibrillation: Secondary | ICD-10-CM

## 2012-01-08 DIAGNOSIS — I25810 Atherosclerosis of coronary artery bypass graft(s) without angina pectoris: Secondary | ICD-10-CM

## 2012-01-08 DIAGNOSIS — I10 Essential (primary) hypertension: Secondary | ICD-10-CM

## 2012-01-08 NOTE — Assessment & Plan Note (Signed)
Heart rate is well controlled. She is currently on warfarin. High risk of falls though will continue warfarin for now. If she continues to have traumatic falls, we could decrease the dose of warfarin and run her subtherapeutic with INR less than 2.

## 2012-01-08 NOTE — Assessment & Plan Note (Signed)
Currently with no symptoms of angina. No further workup at this time. Continue current medication regimen. 

## 2012-01-08 NOTE — Assessment & Plan Note (Signed)
Blood pressure numbers have been ranging in the 130-150 range by their measurements. No further medication changes made.
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
## 2012-01-08 NOTE — Patient Instructions (Signed)
You are doing well. No medication changes were made.  Please call us if you have new issues that need to be addressed before your next appt.  Your physician wants you to follow-up in: 6 months.  You will receive a reminder letter in the mail two months in advance. If you don't receive a letter, please call our office to schedule the follow-up appointment.   

## 2012-02-06 ENCOUNTER — Inpatient Hospital Stay: Payer: Self-pay | Admitting: Internal Medicine

## 2012-02-06 ENCOUNTER — Ambulatory Visit: Payer: Medicare Other | Admitting: Cardiovascular Disease

## 2012-02-06 LAB — COMPREHENSIVE METABOLIC PANEL
Albumin: 3.3 g/dL — ABNORMAL LOW (ref 3.4–5.0)
Alkaline Phosphatase: 128 U/L (ref 50–136)
BUN: 71 mg/dL — ABNORMAL HIGH (ref 7–18)
Bilirubin,Total: 0.5 mg/dL (ref 0.2–1.0)
Calcium, Total: 8.5 mg/dL (ref 8.5–10.1)
Chloride: 96 mmol/L — ABNORMAL LOW (ref 98–107)
Creatinine: 1.65 mg/dL — ABNORMAL HIGH (ref 0.60–1.30)
EGFR (African American): 32 — ABNORMAL LOW
Osmolality: 290 (ref 275–301)
Potassium: 4.1 mmol/L (ref 3.5–5.1)
SGPT (ALT): 20 U/L
Sodium: 133 mmol/L — ABNORMAL LOW (ref 136–145)
Total Protein: 7.1 g/dL (ref 6.4–8.2)

## 2012-02-06 LAB — CBC WITH DIFFERENTIAL/PLATELET
Basophil %: 1.2 %
Eosinophil #: 0.8 10*3/uL — ABNORMAL HIGH (ref 0.0–0.7)
Lymphocyte #: 1.9 10*3/uL (ref 1.0–3.6)
Lymphocyte %: 27 %
MCV: 86 fL (ref 80–100)
Monocyte #: 0.9 x10 3/mm (ref 0.2–0.9)
Monocyte %: 12.1 %
Neutrophil #: 3.5 10*3/uL (ref 1.4–6.5)
Neutrophil %: 49 %
Platelet: 199 10*3/uL (ref 150–440)
RBC: 2.7 10*6/uL — ABNORMAL LOW (ref 3.80–5.20)
RDW: 17.1 % — ABNORMAL HIGH (ref 11.5–14.5)
WBC: 7.1 10*3/uL (ref 3.6–11.0)

## 2012-02-07 LAB — CBC WITH DIFFERENTIAL/PLATELET
Basophil #: 0.1 10*3/uL (ref 0.0–0.1)
Basophil %: 1.2 %
Eosinophil #: 0.7 10*3/uL (ref 0.0–0.7)
Eosinophil %: 11.4 %
HCT: 28.4 % — ABNORMAL LOW (ref 35.0–47.0)
HGB: 9.3 g/dL — ABNORMAL LOW (ref 12.0–16.0)
Lymphocyte #: 1.8 10*3/uL (ref 1.0–3.6)
MCH: 28.1 pg (ref 26.0–34.0)
MCHC: 32.8 g/dL (ref 32.0–36.0)
Platelet: 165 10*3/uL (ref 150–440)
RBC: 3.31 10*6/uL — ABNORMAL LOW (ref 3.80–5.20)
WBC: 6.4 10*3/uL (ref 3.6–11.0)

## 2012-02-07 LAB — COMPREHENSIVE METABOLIC PANEL
Albumin: 3.1 g/dL — ABNORMAL LOW (ref 3.4–5.0)
Anion Gap: 9 (ref 7–16)
Chloride: 95 mmol/L — ABNORMAL LOW (ref 98–107)
Co2: 28 mmol/L (ref 21–32)
Creatinine: 1.48 mg/dL — ABNORMAL HIGH (ref 0.60–1.30)
EGFR (Non-African Amer.): 32 — ABNORMAL LOW
Glucose: 102 mg/dL — ABNORMAL HIGH (ref 65–99)
Osmolality: 283 (ref 275–301)
Potassium: 3.9 mmol/L (ref 3.5–5.1)
SGPT (ALT): 20 U/L
Total Protein: 6.6 g/dL (ref 6.4–8.2)

## 2012-02-07 LAB — PROTIME-INR: INR: 2

## 2012-02-08 DIAGNOSIS — R609 Edema, unspecified: Secondary | ICD-10-CM

## 2012-02-08 LAB — CBC WITH DIFFERENTIAL/PLATELET
Basophil #: 0.1 10*3/uL (ref 0.0–0.1)
Basophil %: 0.9 %
Eosinophil %: 8.2 %
HGB: 9.2 g/dL — ABNORMAL LOW (ref 12.0–16.0)
Lymphocyte #: 1.8 10*3/uL (ref 1.0–3.6)
Lymphocyte %: 30.1 %
MCH: 28.3 pg (ref 26.0–34.0)
MCHC: 33 g/dL (ref 32.0–36.0)
MCV: 86 fL (ref 80–100)
Monocyte #: 0.8 x10 3/mm (ref 0.2–0.9)
Monocyte %: 12.7 %
Neutrophil #: 2.9 10*3/uL (ref 1.4–6.5)
Neutrophil %: 48.1 %
RBC: 3.24 10*6/uL — ABNORMAL LOW (ref 3.80–5.20)
RDW: 16.6 % — ABNORMAL HIGH (ref 11.5–14.5)
WBC: 6 10*3/uL (ref 3.6–11.0)

## 2012-02-08 LAB — BASIC METABOLIC PANEL
Anion Gap: 9 (ref 7–16)
BUN: 50 mg/dL — ABNORMAL HIGH (ref 7–18)
Chloride: 97 mmol/L — ABNORMAL LOW (ref 98–107)
Creatinine: 1.41 mg/dL — ABNORMAL HIGH (ref 0.60–1.30)
EGFR (African American): 39 — ABNORMAL LOW
EGFR (Non-African Amer.): 34 — ABNORMAL LOW
Glucose: 91 mg/dL (ref 65–99)
Osmolality: 279 (ref 275–301)
Sodium: 133 mmol/L — ABNORMAL LOW (ref 136–145)

## 2012-02-08 LAB — PROTIME-INR: INR: 1.8

## 2012-02-09 DIAGNOSIS — I319 Disease of pericardium, unspecified: Secondary | ICD-10-CM

## 2012-02-09 LAB — COMPREHENSIVE METABOLIC PANEL
Anion Gap: 8 (ref 7–16)
BUN: 38 mg/dL — ABNORMAL HIGH (ref 7–18)
Calcium, Total: 8.6 mg/dL (ref 8.5–10.1)
Creatinine: 1.21 mg/dL (ref 0.60–1.30)
EGFR (African American): 47 — ABNORMAL LOW
EGFR (Non-African Amer.): 40 — ABNORMAL LOW
Osmolality: 275 (ref 275–301)
Potassium: 3.9 mmol/L (ref 3.5–5.1)
SGOT(AST): 33 U/L (ref 15–37)
SGPT (ALT): 20 U/L
Sodium: 133 mmol/L — ABNORMAL LOW (ref 136–145)
Total Protein: 6.3 g/dL — ABNORMAL LOW (ref 6.4–8.2)

## 2012-02-09 LAB — PROTIME-INR: INR: 1.6

## 2012-02-09 LAB — CBC WITH DIFFERENTIAL/PLATELET
Basophil #: 0 10*3/uL (ref 0.0–0.1)
Eosinophil #: 0.4 10*3/uL (ref 0.0–0.7)
Eosinophil %: 8.9 %
HCT: 27.5 % — ABNORMAL LOW (ref 35.0–47.0)
HGB: 9.1 g/dL — ABNORMAL LOW (ref 12.0–16.0)
MCH: 28.3 pg (ref 26.0–34.0)
Monocyte %: 12.9 %
Neutrophil #: 2.3 10*3/uL (ref 1.4–6.5)
Neutrophil %: 45.9 %
RDW: 17.1 % — ABNORMAL HIGH (ref 11.5–14.5)
WBC: 5 10*3/uL (ref 3.6–11.0)

## 2012-02-10 LAB — PROTIME-INR
INR: 1.4
Prothrombin Time: 17.1 secs — ABNORMAL HIGH (ref 11.5–14.7)

## 2012-02-10 LAB — HEMOGLOBIN: HGB: 9.6 g/dL — ABNORMAL LOW (ref 12.0–16.0)

## 2012-02-11 DIAGNOSIS — I509 Heart failure, unspecified: Secondary | ICD-10-CM

## 2012-02-11 LAB — HEMOGLOBIN: HGB: 9.6 g/dL — ABNORMAL LOW (ref 12.0–16.0)

## 2012-02-12 LAB — HEMOGLOBIN: HGB: 9.6 g/dL — ABNORMAL LOW

## 2012-04-22 ENCOUNTER — Encounter: Payer: Self-pay | Admitting: Internal Medicine

## 2012-05-05 ENCOUNTER — Other Ambulatory Visit (HOSPITAL_COMMUNITY): Payer: Self-pay | Admitting: *Deleted

## 2012-05-06 ENCOUNTER — Encounter (HOSPITAL_COMMUNITY)
Admission: RE | Admit: 2012-05-06 | Discharge: 2012-05-06 | Disposition: A | Discharge status: Home / Self Care | Payer: Medicare Other | Source: Ambulatory Visit | Attending: Nephrology | Admitting: Nephrology

## 2012-05-06 DIAGNOSIS — D649 Anemia, unspecified: Secondary | ICD-10-CM | POA: Insufficient documentation

## 2012-05-06 DIAGNOSIS — N179 Acute kidney failure, unspecified: Secondary | ICD-10-CM | POA: Insufficient documentation

## 2012-05-06 MED ORDER — FERUMOXYTOL INJECTION 510 MG/17 ML
INTRAVENOUS | Status: AC
  Filled 2012-05-06: qty 17

## 2012-05-06 MED ORDER — SODIUM CHLORIDE 0.9 % IV SOLN
Freq: Once | INTRAVENOUS | Status: AC
  Administered 2012-05-06: 13:00:00 via INTRAVENOUS

## 2012-05-06 MED ORDER — FERUMOXYTOL INJECTION 510 MG/17 ML
510.0000 mg | Freq: Once | INTRAVENOUS | Status: AC
  Administered 2012-05-06: 510 mg via INTRAVENOUS

## 2012-05-20 ENCOUNTER — Emergency Department: Payer: Self-pay | Admitting: *Deleted

## 2012-10-16 ENCOUNTER — Other Ambulatory Visit (HOSPITAL_COMMUNITY): Payer: Self-pay | Admitting: *Deleted

## 2012-10-19 ENCOUNTER — Encounter (HOSPITAL_COMMUNITY): Payer: Medicare Other

## 2012-10-19 ENCOUNTER — Ambulatory Visit (HOSPITAL_COMMUNITY)
Admission: RE | Admit: 2012-10-19 | Discharge: 2012-10-19 | Disposition: A | Discharge status: Home / Self Care | Payer: Medicare Other | Source: Ambulatory Visit | Attending: Nephrology | Admitting: Nephrology

## 2012-10-19 DIAGNOSIS — D509 Iron deficiency anemia, unspecified: Secondary | ICD-10-CM | POA: Insufficient documentation

## 2012-10-19 MED ORDER — SODIUM CHLORIDE 0.9 % IV SOLN
INTRAVENOUS | Status: DC
  Administered 2012-10-19: 250 mL via INTRAVENOUS

## 2012-10-19 MED ORDER — FERUMOXYTOL INJECTION 510 MG/17 ML
510.0000 mg | Freq: Once | INTRAVENOUS | Status: AC
  Administered 2012-10-19: 510 mg via INTRAVENOUS

## 2013-06-26 ENCOUNTER — Inpatient Hospital Stay: Payer: Self-pay | Admitting: Internal Medicine

## 2013-06-26 LAB — CBC
MCH: 32.2 pg (ref 26.0–34.0)
MCHC: 33.9 g/dL (ref 32.0–36.0)
Platelet: 136 10*3/uL — ABNORMAL LOW (ref 150–440)
RBC: 3.82 10*6/uL (ref 3.80–5.20)
RDW: 15.9 % — ABNORMAL HIGH (ref 11.5–14.5)
WBC: 7.3 10*3/uL (ref 3.6–11.0)

## 2013-06-26 LAB — COMPREHENSIVE METABOLIC PANEL
Albumin: 3.5 g/dL (ref 3.4–5.0)
Anion Gap: 10 (ref 7–16)
Calcium, Total: 8.9 mg/dL (ref 8.5–10.1)
Creatinine: 0.94 mg/dL (ref 0.60–1.30)
EGFR (African American): >60
EGFR (Non-African Amer.): 54 — ABNORMAL LOW
Glucose: 117 mg/dL — ABNORMAL HIGH (ref 65–99)
Osmolality: 262 (ref 275–301)
Potassium: 3.5 mmol/L (ref 3.5–5.1)
Total Protein: 6.6 g/dL (ref 6.4–8.2)

## 2013-06-26 LAB — URINALYSIS, COMPLETE
Bilirubin,UR: NEGATIVE
Glucose,UR: NEGATIVE mg/dL (ref 0–75)
Ketone: NEGATIVE
Nitrite: POSITIVE
Ph: 6 (ref 4.5–8.0)
Specific Gravity: 1.013 (ref 1.003–1.030)
WBC UR: 288 /HPF (ref 0–5)

## 2013-06-26 LAB — CK TOTAL AND CKMB (NOT AT ARMC)
CK, Total: 229 U/L — ABNORMAL HIGH (ref 21–215)
CK-MB: 5.5 ng/mL — ABNORMAL HIGH (ref 0.5–3.6)

## 2013-06-26 LAB — TROPONIN I
Troponin-I: 0.03 ng/mL
Troponin-I: 0.03 ng/mL

## 2013-06-27 LAB — CBC WITH DIFFERENTIAL/PLATELET
Eosinophil #: 0.2 10*3/uL (ref 0.0–0.7)
Eosinophil %: 4.4 %
HCT: 34.3 % — ABNORMAL LOW (ref 35.0–47.0)
HGB: 11.7 g/dL — ABNORMAL LOW (ref 12.0–16.0)
Lymphocyte #: 1.1 10*3/uL (ref 1.0–3.6)
MCH: 32.4 pg (ref 26.0–34.0)
MCHC: 34 g/dL (ref 32.0–36.0)
Monocyte %: 11.9 %
Neutrophil #: 3.3 10*3/uL (ref 1.4–6.5)
Neutrophil %: 62.3 %
RDW: 15.9 % — ABNORMAL HIGH (ref 11.5–14.5)
WBC: 5.3 10*3/uL (ref 3.6–11.0)

## 2013-06-27 LAB — BASIC METABOLIC PANEL
BUN: 19 mg/dL — ABNORMAL HIGH (ref 7–18)
Calcium, Total: 8.4 mg/dL — ABNORMAL LOW (ref 8.5–10.1)
Co2: 31 mmol/L (ref 21–32)
EGFR (African American): 46 — ABNORMAL LOW
EGFR (Non-African Amer.): 40 — ABNORMAL LOW
Osmolality: 271 (ref 275–301)
Potassium: 4.2 mmol/L (ref 3.5–5.1)
Sodium: 134 mmol/L — ABNORMAL LOW (ref 136–145)

## 2013-06-27 LAB — TROPONIN I: Troponin-I: 0.03 ng/mL

## 2013-06-27 LAB — TSH: Thyroid Stimulating Horm: 17.4 u[IU]/mL — ABNORMAL HIGH

## 2013-06-27 LAB — CK TOTAL AND CKMB (NOT AT ARMC): CK-MB: 4.1 ng/mL — ABNORMAL HIGH (ref 0.5–3.6)

## 2013-06-28 LAB — CBC WITH DIFFERENTIAL/PLATELET
Basophil #: 0.1 10*3/uL (ref 0.0–0.1)
Basophil %: 0.9 %
Eosinophil #: 0.3 10*3/uL (ref 0.0–0.7)
Eosinophil %: 5.5 %
HCT: 33 % — ABNORMAL LOW (ref 35.0–47.0)
Lymphocyte #: 1.2 10*3/uL (ref 1.0–3.6)
MCH: 32.4 pg (ref 26.0–34.0)
MCHC: 34 g/dL (ref 32.0–36.0)
MCV: 96 fL (ref 80–100)
Monocyte #: 0.7 x10 3/mm (ref 0.2–0.9)
Monocyte %: 12.1 %
Neutrophil %: 61.3 %
Platelet: 123 10*3/uL — ABNORMAL LOW (ref 150–440)
RBC: 3.46 10*6/uL — ABNORMAL LOW (ref 3.80–5.20)
RDW: 16.1 % — ABNORMAL HIGH (ref 11.5–14.5)

## 2013-06-28 LAB — BASIC METABOLIC PANEL
Anion Gap: 2 — ABNORMAL LOW (ref 7–16)
BUN: 25 mg/dL — ABNORMAL HIGH (ref 7–18)
Creatinine: 1.6 mg/dL — ABNORMAL HIGH (ref 0.60–1.30)
EGFR (African American): 33 — ABNORMAL LOW
EGFR (Non-African Amer.): 28 — ABNORMAL LOW
Osmolality: 266 (ref 275–301)
Potassium: 4.6 mmol/L (ref 3.5–5.1)
Sodium: 130 mmol/L — ABNORMAL LOW (ref 136–145)

## 2013-06-29 LAB — CBC WITH DIFFERENTIAL/PLATELET
Basophil %: 1 %
Eosinophil #: 0.4 10*3/uL (ref 0.0–0.7)
HCT: 35.4 % (ref 35.0–47.0)
HGB: 12.1 g/dL (ref 12.0–16.0)
Lymphocyte #: 1.1 10*3/uL (ref 1.0–3.6)
Lymphocyte %: 18.4 %
MCH: 32.6 pg (ref 26.0–34.0)
Monocyte %: 11.9 %
Neutrophil %: 62.7 %
Platelet: 130 10*3/uL — ABNORMAL LOW (ref 150–440)

## 2013-06-29 LAB — BASIC METABOLIC PANEL
Chloride: 100 mmol/L (ref 98–107)
Co2: 29 mmol/L (ref 21–32)
Creatinine: 1.22 mg/dL (ref 0.60–1.30)
EGFR (African American): 46 — ABNORMAL LOW
EGFR (Non-African Amer.): 40 — ABNORMAL LOW
Sodium: 131 mmol/L — ABNORMAL LOW (ref 136–145)

## 2013-07-01 ENCOUNTER — Encounter: Payer: Self-pay | Admitting: Internal Medicine

## 2013-07-10 LAB — URINALYSIS, COMPLETE
Bacteria: NONE SEEN
Glucose,UR: NEGATIVE mg/dL (ref 0–75)
Nitrite: NEGATIVE
RBC,UR: 1 /HPF (ref 0–5)
Specific Gravity: 1.006 (ref 1.003–1.030)
Squamous Epithelial: 1
Transitional Epi: <1

## 2013-07-11 LAB — URINE CULTURE

## 2013-07-13 LAB — BASIC METABOLIC PANEL
Anion Gap: 6 — ABNORMAL LOW (ref 7–16)
BUN: 22 mg/dL — ABNORMAL HIGH (ref 7–18)
Calcium, Total: 8.9 mg/dL (ref 8.5–10.1)
Chloride: 100 mmol/L (ref 98–107)
Creatinine: 1.32 mg/dL — ABNORMAL HIGH (ref 0.60–1.30)
Osmolality: 271 (ref 275–301)
Sodium: 133 mmol/L — ABNORMAL LOW (ref 136–145)

## 2013-07-26 ENCOUNTER — Encounter: Payer: Self-pay | Admitting: Internal Medicine

## 2013-07-26 LAB — URINALYSIS, COMPLETE
Bilirubin,UR: NEGATIVE
Blood: NEGATIVE
Glucose,UR: NEGATIVE mg/dL (ref 0–75)
Leukocyte Esterase: NEGATIVE
Nitrite: NEGATIVE
Protein: NEGATIVE
RBC,UR: <1 /HPF (ref 0–5)
Specific Gravity: 1.012 (ref 1.003–1.030)
Transitional Epi: <1
WBC UR: 1 /HPF (ref 0–5)

## 2013-07-31 LAB — URINALYSIS, COMPLETE
Glucose,UR: NEGATIVE mg/dL (ref 0–75)
Ketone: NEGATIVE
Ph: 6 (ref 4.5–8.0)
Protein: NEGATIVE
RBC,UR: 2 /HPF (ref 0–5)
Squamous Epithelial: 1
WBC UR: 846 /HPF (ref 0–5)

## 2013-08-03 LAB — URINE CULTURE

## 2013-08-26 ENCOUNTER — Encounter: Payer: Self-pay | Admitting: Internal Medicine

## 2014-02-01 ENCOUNTER — Telehealth: Payer: Self-pay | Admitting: *Deleted

## 2014-02-01 NOTE — Telephone Encounter (Signed)
Patient needs surgical clearance for knee surgery with Dr. Ernest Pine [facility]). Please call patient

## 2014-02-02 NOTE — Telephone Encounter (Signed)
LVM 6/10  

## 2014-02-03 NOTE — Telephone Encounter (Signed)
Dr. Ernest Pine would like to do a knee replacement on patient  She needs cardiac clearance  Patients husband stated they have not scheduled this procedure  They wanted Dr. Mariah Milling to let them know if her "heart was strong enough"

## 2014-02-09 NOTE — Telephone Encounter (Signed)
Spoke w/ pt's husband.  Advised him that pt has not been seen in our office since 2013. He states that he is aware of this and would like to schedule an appt to see Dr. Mariah MillingGollan.  Pt sched to see Dr. Mariah MillingGollan 03/28/14 @ 10:45. He reports that pt is in "bad shape" and would like to wait until the 1st week in August, as they have family obligations.

## 2014-02-21 ENCOUNTER — Inpatient Hospital Stay: Payer: Self-pay | Admitting: Internal Medicine

## 2014-02-21 LAB — CK TOTAL AND CKMB (NOT AT ARMC)
CK, TOTAL: 63 U/L
CK-MB: 1.8 ng/mL (ref 0.5–3.6)

## 2014-02-21 LAB — COMPREHENSIVE METABOLIC PANEL
ALT: 22 U/L (ref 12–78)
ANION GAP: 11 (ref 7–16)
AST: 29 U/L (ref 15–37)
Albumin: 3.3 g/dL — ABNORMAL LOW (ref 3.4–5.0)
Alkaline Phosphatase: 142 U/L — ABNORMAL HIGH
BILIRUBIN TOTAL: 1.8 mg/dL — AB (ref 0.2–1.0)
BUN: 18 mg/dL (ref 7–18)
CALCIUM: 9 mg/dL (ref 8.5–10.1)
CHLORIDE: 105 mmol/L (ref 98–107)
CO2: 26 mmol/L (ref 21–32)
CREATININE: 1.16 mg/dL (ref 0.60–1.30)
EGFR (African American): 49 — ABNORMAL LOW
EGFR (Non-African Amer.): 42 — ABNORMAL LOW
Glucose: 73 mg/dL (ref 65–99)
Osmolality: 284 (ref 275–301)
POTASSIUM: 3.6 mmol/L (ref 3.5–5.1)
Sodium: 142 mmol/L (ref 136–145)
Total Protein: 6.8 g/dL (ref 6.4–8.2)

## 2014-02-21 LAB — URINALYSIS, COMPLETE
BILIRUBIN, UR: NEGATIVE
Blood: NEGATIVE
Glucose,UR: NEGATIVE mg/dL (ref 0–75)
Ketone: NEGATIVE
LEUKOCYTE ESTERASE: NEGATIVE
Nitrite: NEGATIVE
Ph: 5 (ref 4.5–8.0)
RBC,UR: 1 /HPF (ref 0–5)
Specific Gravity: 1.017 (ref 1.003–1.030)
Squamous Epithelial: NONE SEEN

## 2014-02-21 LAB — CBC
HCT: 43.2 % (ref 35.0–47.0)
HGB: 13.4 g/dL (ref 12.0–16.0)
MCH: 30 pg (ref 26.0–34.0)
MCHC: 31.1 g/dL — AB (ref 32.0–36.0)
MCV: 97 fL (ref 80–100)
Platelet: 155 10*3/uL (ref 150–440)
RBC: 4.47 10*6/uL (ref 3.80–5.20)
RDW: 19.4 % — ABNORMAL HIGH (ref 11.5–14.5)
WBC: 13.4 10*3/uL — ABNORMAL HIGH (ref 3.6–11.0)

## 2014-02-21 LAB — PROTIME-INR
INR: 1.3
PROTHROMBIN TIME: 16.3 s — AB (ref 11.5–14.7)

## 2014-02-21 LAB — TSH: Thyroid Stimulating Horm: 14 u[IU]/mL — ABNORMAL HIGH

## 2014-02-21 LAB — T4, FREE: Free Thyroxine: 1.35 ng/dL (ref 0.76–1.46)

## 2014-02-21 LAB — APTT: Activated PTT: 28.5 secs (ref 23.6–35.9)

## 2014-02-21 LAB — TROPONIN I: TROPONIN-I: 0.03 ng/mL

## 2014-02-22 ENCOUNTER — Ambulatory Visit: Payer: Self-pay | Admitting: Internal Medicine

## 2014-02-22 LAB — CBC WITH DIFFERENTIAL/PLATELET
Basophil #: 0.1 10*3/uL (ref 0.0–0.1)
Basophil %: 0.4 %
EOS PCT: 0.2 %
Eosinophil #: 0 10*3/uL (ref 0.0–0.7)
HCT: 38.5 % (ref 35.0–47.0)
HGB: 12.1 g/dL (ref 12.0–16.0)
LYMPHS PCT: 7.4 %
Lymphocyte #: 1.1 10*3/uL (ref 1.0–3.6)
MCH: 30.2 pg (ref 26.0–34.0)
MCHC: 31.4 g/dL — AB (ref 32.0–36.0)
MCV: 96 fL (ref 80–100)
Monocyte #: 1.1 x10 3/mm — ABNORMAL HIGH (ref 0.2–0.9)
Monocyte %: 7.3 %
Neutrophil #: 12.7 10*3/uL — ABNORMAL HIGH (ref 1.4–6.5)
Neutrophil %: 84.7 %
Platelet: 135 10*3/uL — ABNORMAL LOW (ref 150–440)
RBC: 4 10*6/uL (ref 3.80–5.20)
RDW: 19.4 % — ABNORMAL HIGH (ref 11.5–14.5)
WBC: 15.1 10*3/uL — AB (ref 3.6–11.0)

## 2014-02-22 LAB — BASIC METABOLIC PANEL
Anion Gap: 7 (ref 7–16)
BUN: 16 mg/dL (ref 7–18)
CALCIUM: 8.3 mg/dL — AB (ref 8.5–10.1)
CHLORIDE: 106 mmol/L (ref 98–107)
CO2: 27 mmol/L (ref 21–32)
Creatinine: 1.25 mg/dL (ref 0.60–1.30)
GFR CALC AF AMER: 44 — AB
GFR CALC NON AF AMER: 38 — AB
Glucose: 119 mg/dL — ABNORMAL HIGH (ref 65–99)
Osmolality: 282 (ref 275–301)
Potassium: 3.9 mmol/L (ref 3.5–5.1)
Sodium: 140 mmol/L (ref 136–145)

## 2014-02-23 ENCOUNTER — Ambulatory Visit
Admit: 2014-02-23 | Disposition: A | Discharge status: Home / Self Care | Payer: Self-pay | Attending: Nurse Practitioner | Admitting: Nurse Practitioner

## 2014-02-23 ENCOUNTER — Ambulatory Visit: Payer: Self-pay | Admitting: Internal Medicine

## 2014-02-23 LAB — CBC WITH DIFFERENTIAL/PLATELET
BASOS PCT: 0.1 %
Basophil #: 0 10*3/uL (ref 0.0–0.1)
EOS ABS: 0 10*3/uL (ref 0.0–0.7)
EOS PCT: 0 %
HCT: 37.1 % (ref 35.0–47.0)
HGB: 12.1 g/dL (ref 12.0–16.0)
LYMPHS PCT: 13.9 %
Lymphocyte #: 1 10*3/uL (ref 1.0–3.6)
MCH: 31.1 pg (ref 26.0–34.0)
MCHC: 32.6 g/dL (ref 32.0–36.0)
MCV: 96 fL (ref 80–100)
MONO ABS: 0.4 x10 3/mm (ref 0.2–0.9)
Monocyte %: 6 %
Neutrophil #: 5.8 10*3/uL (ref 1.4–6.5)
Neutrophil %: 80 %
PLATELETS: 142 10*3/uL — AB (ref 150–440)
RBC: 3.88 10*6/uL (ref 3.80–5.20)
RDW: 19.4 % — AB (ref 11.5–14.5)
WBC: 7.3 10*3/uL (ref 3.6–11.0)

## 2014-02-24 ENCOUNTER — Encounter: Payer: Self-pay | Admitting: Internal Medicine

## 2014-02-26 ENCOUNTER — Inpatient Hospital Stay: Payer: Self-pay | Admitting: Internal Medicine

## 2014-02-26 LAB — COMPREHENSIVE METABOLIC PANEL
ANION GAP: 9 (ref 7–16)
AST: 70 U/L — AB (ref 15–37)
Albumin: 2.9 g/dL — ABNORMAL LOW (ref 3.4–5.0)
Alkaline Phosphatase: 140 U/L — ABNORMAL HIGH
BUN: 14 mg/dL (ref 7–18)
Bilirubin,Total: 0.9 mg/dL (ref 0.2–1.0)
CO2: 23 mmol/L (ref 21–32)
CREATININE: 0.82 mg/dL (ref 0.60–1.30)
Calcium, Total: 9.2 mg/dL (ref 8.5–10.1)
Chloride: 109 mmol/L — ABNORMAL HIGH (ref 98–107)
EGFR (African American): >60
EGFR (Non-African Amer.): >60
Glucose: 108 mg/dL — ABNORMAL HIGH (ref 65–99)
Osmolality: 282 (ref 275–301)
Potassium: 5.9 mmol/L — ABNORMAL HIGH (ref 3.5–5.1)
SGPT (ALT): 27 U/L (ref 12–78)
Sodium: 141 mmol/L (ref 136–145)
TOTAL PROTEIN: 7.1 g/dL (ref 6.4–8.2)

## 2014-02-26 LAB — CBC WITH DIFFERENTIAL/PLATELET
BASOS PCT: 0.7 %
Basophil #: 0.1 10*3/uL (ref 0.0–0.1)
EOS PCT: 2.7 %
Eosinophil #: 0.2 10*3/uL (ref 0.0–0.7)
HCT: 43.3 % (ref 35.0–47.0)
HGB: 13.9 g/dL (ref 12.0–16.0)
LYMPHS ABS: 1.5 10*3/uL (ref 1.0–3.6)
LYMPHS PCT: 19.3 %
MCH: 31 pg (ref 26.0–34.0)
MCHC: 32.2 g/dL (ref 32.0–36.0)
MCV: 96 fL (ref 80–100)
MONO ABS: 0.9 x10 3/mm (ref 0.2–0.9)
Monocyte %: 11.3 %
Neutrophil #: 5.1 10*3/uL (ref 1.4–6.5)
Neutrophil %: 66 %
Platelet: 161 10*3/uL (ref 150–440)
RBC: 4.5 10*6/uL (ref 3.80–5.20)
RDW: 19.2 % — AB (ref 11.5–14.5)
WBC: 7.8 10*3/uL (ref 3.6–11.0)

## 2014-02-26 LAB — URINALYSIS, COMPLETE
BILIRUBIN, UR: NEGATIVE
Blood: NEGATIVE
Glucose,UR: NEGATIVE mg/dL (ref 0–75)
LEUKOCYTE ESTERASE: NEGATIVE
Nitrite: NEGATIVE
PH: 5 (ref 4.5–8.0)
Protein: >=500
Specific Gravity: 1.025 (ref 1.003–1.030)

## 2014-02-26 LAB — CK-MB
CK-MB: 4.8 ng/mL — AB (ref 0.5–3.6)
CK-MB: 4.1 ng/mL — AB (ref 0.5–3.6)
CK-MB: 4 ng/mL — ABNORMAL HIGH (ref 0.5–3.6)

## 2014-02-26 LAB — LIPASE, BLOOD: LIPASE: 71 U/L — AB (ref 73–393)

## 2014-02-26 LAB — PROTIME-INR
INR: 1.3
Prothrombin Time: 15.6 secs — ABNORMAL HIGH (ref 11.5–14.7)

## 2014-02-26 LAB — TROPONIN I
TROPONIN-I: 0.95 ng/mL — AB
TROPONIN-I: 0.87 ng/mL — AB
TROPONIN-I: 0.85 ng/mL — AB

## 2014-02-26 LAB — HEPARIN LEVEL (UNFRACTIONATED): ANTI-XA(UNFRACTIONATED): 0.3 [IU]/mL (ref 0.30–0.70)

## 2014-02-26 LAB — MAGNESIUM: Magnesium: 2 mg/dL

## 2014-02-26 LAB — APTT: ACTIVATED PTT: 40.4 s — AB (ref 23.6–35.9)

## 2014-02-27 LAB — CBC WITH DIFFERENTIAL/PLATELET
BASOS PCT: 0.7 %
Basophil #: 0.1 10*3/uL (ref 0.0–0.1)
EOS ABS: 0.2 10*3/uL (ref 0.0–0.7)
EOS PCT: 2.5 %
HCT: 38.7 % (ref 35.0–47.0)
HGB: 12.6 g/dL (ref 12.0–16.0)
LYMPHS ABS: 1.3 10*3/uL (ref 1.0–3.6)
Lymphocyte %: 17.4 %
MCH: 31.1 pg (ref 26.0–34.0)
MCHC: 32.6 g/dL (ref 32.0–36.0)
MCV: 96 fL (ref 80–100)
MONOS PCT: 11.7 %
Monocyte #: 0.9 x10 3/mm (ref 0.2–0.9)
NEUTROS PCT: 67.7 %
Neutrophil #: 5.2 10*3/uL (ref 1.4–6.5)
Platelet: 141 10*3/uL — ABNORMAL LOW (ref 150–440)
RBC: 4.05 10*6/uL (ref 3.80–5.20)
RDW: 18.6 % — ABNORMAL HIGH (ref 11.5–14.5)
WBC: 7.6 10*3/uL (ref 3.6–11.0)

## 2014-02-27 LAB — BASIC METABOLIC PANEL
Anion Gap: 9 (ref 7–16)
BUN: 14 mg/dL (ref 7–18)
CALCIUM: 8.4 mg/dL — AB (ref 8.5–10.1)
CREATININE: 0.99 mg/dL (ref 0.60–1.30)
Chloride: 109 mmol/L — ABNORMAL HIGH (ref 98–107)
Co2: 24 mmol/L (ref 21–32)
EGFR (African American): 59 — ABNORMAL LOW
GFR CALC NON AF AMER: 51 — AB
Glucose: 97 mg/dL (ref 65–99)
OSMOLALITY: 284 (ref 275–301)
Potassium: 3.8 mmol/L (ref 3.5–5.1)
SODIUM: 142 mmol/L (ref 136–145)

## 2014-02-27 LAB — LIPID PANEL
CHOLESTEROL: 119 mg/dL (ref 0–200)
HDL: 43 mg/dL (ref 40–60)
LDL CHOLESTEROL, CALC: 65 mg/dL (ref 0–100)
Triglycerides: 53 mg/dL (ref 0–200)
VLDL Cholesterol, Calc: 11 mg/dL (ref 5–40)

## 2014-02-27 LAB — URINE CULTURE

## 2014-02-27 LAB — HEPARIN LEVEL (UNFRACTIONATED)
Anti-Xa(Unfractionated): 0.24 IU/mL — ABNORMAL LOW (ref 0.30–0.70)
Anti-Xa(Unfractionated): 0.36 IU/mL (ref 0.30–0.70)

## 2014-02-28 LAB — HEPARIN LEVEL (UNFRACTIONATED): ANTI-XA(UNFRACTIONATED): 0.36 [IU]/mL (ref 0.30–0.70)

## 2014-02-28 LAB — BASIC METABOLIC PANEL
Anion Gap: 6 — ABNORMAL LOW (ref 7–16)
BUN: 13 mg/dL (ref 7–18)
CO2: 27 mmol/L (ref 21–32)
CREATININE: 0.96 mg/dL (ref 0.60–1.30)
Calcium, Total: 8.2 mg/dL — ABNORMAL LOW (ref 8.5–10.1)
Chloride: 108 mmol/L — ABNORMAL HIGH (ref 98–107)
EGFR (African American): >60
EGFR (Non-African Amer.): 53 — ABNORMAL LOW
GLUCOSE: 97 mg/dL (ref 65–99)
OSMOLALITY: 281 (ref 275–301)
Potassium: 3.8 mmol/L (ref 3.5–5.1)
Sodium: 141 mmol/L (ref 136–145)

## 2014-03-01 ENCOUNTER — Encounter: Payer: Self-pay | Admitting: Internal Medicine

## 2014-03-01 LAB — CBC WITH DIFFERENTIAL/PLATELET
BASOS ABS: 0.1 10*3/uL (ref 0.0–0.1)
Basophil %: 1 %
Eosinophil #: 0.1 10*3/uL (ref 0.0–0.7)
Eosinophil %: 2 %
HCT: 39.7 % (ref 35.0–47.0)
HGB: 13.2 g/dL (ref 12.0–16.0)
LYMPHS ABS: 1.4 10*3/uL (ref 1.0–3.6)
LYMPHS PCT: 21 %
MCH: 31.5 pg (ref 26.0–34.0)
MCHC: 33.3 g/dL (ref 32.0–36.0)
MCV: 95 fL (ref 80–100)
Monocyte #: 0.8 x10 3/mm (ref 0.2–0.9)
Monocyte %: 12.7 %
NEUTROS ABS: 4.2 10*3/uL (ref 1.4–6.5)
Neutrophil %: 63.3 %
Platelet: 171 10*3/uL (ref 150–440)
RBC: 4.2 10*6/uL (ref 3.80–5.20)
RDW: 18.4 % — ABNORMAL HIGH (ref 11.5–14.5)
WBC: 6.7 10*3/uL (ref 3.6–11.0)

## 2014-03-01 LAB — BASIC METABOLIC PANEL
Anion Gap: 7 (ref 7–16)
BUN: 13 mg/dL (ref 7–18)
CALCIUM: 8.4 mg/dL — AB (ref 8.5–10.1)
CREATININE: 1.04 mg/dL (ref 0.60–1.30)
Chloride: 105 mmol/L (ref 98–107)
Co2: 25 mmol/L (ref 21–32)
EGFR (African American): 56 — ABNORMAL LOW
EGFR (Non-African Amer.): 48 — ABNORMAL LOW
Glucose: 98 mg/dL (ref 65–99)
OSMOLALITY: 274 (ref 275–301)
Potassium: 3.5 mmol/L (ref 3.5–5.1)
Sodium: 137 mmol/L (ref 136–145)

## 2014-03-03 LAB — CULTURE, BLOOD (SINGLE)

## 2014-03-10 LAB — CBC WITH DIFFERENTIAL/PLATELET
BASOS ABS: 0.1 10*3/uL (ref 0.0–0.1)
Basophil %: 1.5 %
EOS ABS: 0.3 10*3/uL (ref 0.0–0.7)
Eosinophil %: 4.3 %
HCT: 34.7 % — AB (ref 35.0–47.0)
HGB: 11 g/dL — ABNORMAL LOW (ref 12.0–16.0)
LYMPHS PCT: 27.2 %
Lymphocyte #: 1.6 10*3/uL (ref 1.0–3.6)
MCH: 30.5 pg (ref 26.0–34.0)
MCHC: 31.7 g/dL — ABNORMAL LOW (ref 32.0–36.0)
MCV: 96 fL (ref 80–100)
MONO ABS: 0.6 x10 3/mm (ref 0.2–0.9)
Monocyte %: 11 %
Neutrophil #: 3.3 10*3/uL (ref 1.4–6.5)
Neutrophil %: 56 %
Platelet: 144 10*3/uL — ABNORMAL LOW (ref 150–440)
RBC: 3.61 10*6/uL — AB (ref 3.80–5.20)
RDW: 18.2 % — ABNORMAL HIGH (ref 11.5–14.5)
WBC: 5.9 10*3/uL (ref 3.6–11.0)

## 2014-03-10 LAB — COMPREHENSIVE METABOLIC PANEL
AST: 31 U/L (ref 15–37)
Albumin: 2.7 g/dL — ABNORMAL LOW (ref 3.4–5.0)
Alkaline Phosphatase: 112 U/L
Anion Gap: 5 — ABNORMAL LOW (ref 7–16)
BILIRUBIN TOTAL: 1 mg/dL (ref 0.2–1.0)
BUN: 17 mg/dL (ref 7–18)
CALCIUM: 8.4 mg/dL — AB (ref 8.5–10.1)
Chloride: 101 mmol/L (ref 98–107)
Co2: 31 mmol/L (ref 21–32)
Creatinine: 0.99 mg/dL (ref 0.60–1.30)
EGFR (African American): 59 — ABNORMAL LOW
GFR CALC NON AF AMER: 51 — AB
GLUCOSE: 103 mg/dL — AB (ref 65–99)
OSMOLALITY: 276 (ref 275–301)
Potassium: 3.7 mmol/L (ref 3.5–5.1)
SGPT (ALT): 21 U/L (ref 12–78)
SODIUM: 137 mmol/L (ref 136–145)
Total Protein: 6.5 g/dL (ref 6.4–8.2)

## 2014-03-11 LAB — URINALYSIS, COMPLETE
BILIRUBIN, UR: NEGATIVE
Bacteria: NONE SEEN
Blood: NEGATIVE
GLUCOSE, UR: NEGATIVE mg/dL (ref 0–75)
Ketone: NEGATIVE
Leukocyte Esterase: NEGATIVE
Nitrite: NEGATIVE
Ph: 7 (ref 4.5–8.0)
Protein: NEGATIVE
RBC,UR: 1 /HPF (ref 0–5)
Specific Gravity: 1.01 (ref 1.003–1.030)
WBC UR: 1 /HPF (ref 0–5)

## 2014-03-13 LAB — URINE CULTURE

## 2014-03-17 LAB — CBC WITH DIFFERENTIAL/PLATELET
Basophil #: 0.1 10*3/uL (ref 0.0–0.1)
Basophil %: 1.2 %
EOS PCT: 4.8 %
Eosinophil #: 0.2 10*3/uL (ref 0.0–0.7)
HCT: 33.7 % — ABNORMAL LOW (ref 35.0–47.0)
HGB: 10.9 g/dL — AB (ref 12.0–16.0)
Lymphocyte #: 1.2 10*3/uL (ref 1.0–3.6)
Lymphocyte %: 23.7 %
MCH: 31.1 pg (ref 26.0–34.0)
MCHC: 32.2 g/dL (ref 32.0–36.0)
MCV: 97 fL (ref 80–100)
Monocyte #: 0.7 x10 3/mm (ref 0.2–0.9)
Monocyte %: 12.7 %
NEUTROS ABS: 2.9 10*3/uL (ref 1.4–6.5)
NEUTROS PCT: 57.6 %
PLATELETS: 141 10*3/uL — AB (ref 150–440)
RBC: 3.49 10*6/uL — ABNORMAL LOW (ref 3.80–5.20)
RDW: 18.9 % — ABNORMAL HIGH (ref 11.5–14.5)
WBC: 5.1 10*3/uL (ref 3.6–11.0)

## 2014-03-17 LAB — BASIC METABOLIC PANEL
Anion Gap: 7 (ref 7–16)
BUN: 23 mg/dL — ABNORMAL HIGH (ref 7–18)
CREATININE: 1.09 mg/dL (ref 0.60–1.30)
Calcium, Total: 8.5 mg/dL (ref 8.5–10.1)
Chloride: 103 mmol/L (ref 98–107)
Co2: 27 mmol/L (ref 21–32)
EGFR (African American): 52 — ABNORMAL LOW
EGFR (Non-African Amer.): 45 — ABNORMAL LOW
Glucose: 103 mg/dL — ABNORMAL HIGH (ref 65–99)
Osmolality: 278 (ref 275–301)
Potassium: 4.2 mmol/L (ref 3.5–5.1)
Sodium: 137 mmol/L (ref 136–145)

## 2014-03-17 LAB — MAGNESIUM: Magnesium: 2.1 mg/dL

## 2014-03-17 LAB — TSH: THYROID STIMULATING HORM: 58 u[IU]/mL — AB

## 2014-03-18 LAB — HEPATIC FUNCTION PANEL A (ARMC)
ALT: 18 U/L
AST: 24 U/L (ref 15–37)
Albumin: 3.1 g/dL — ABNORMAL LOW (ref 3.4–5.0)
Alkaline Phosphatase: 136 U/L — ABNORMAL HIGH
BILIRUBIN DIRECT: 0.3 mg/dL — AB (ref 0.00–0.20)
Bilirubin,Total: 0.7 mg/dL (ref 0.2–1.0)
TOTAL PROTEIN: 6.5 g/dL (ref 6.4–8.2)

## 2014-03-26 ENCOUNTER — Encounter: Payer: Self-pay | Admitting: Internal Medicine

## 2014-03-26 ENCOUNTER — Ambulatory Visit: Payer: Self-pay | Admitting: Internal Medicine

## 2014-03-28 ENCOUNTER — Ambulatory Visit: Payer: Medicare Other | Admitting: Cardiovascular Disease

## 2014-04-12 LAB — TSH: THYROID STIMULATING HORM: 27 u[IU]/mL — AB

## 2014-04-26 ENCOUNTER — Encounter: Payer: Self-pay | Admitting: Internal Medicine

## 2014-05-26 ENCOUNTER — Encounter: Payer: Self-pay | Admitting: Internal Medicine

## 2014-06-21 LAB — TSH: Thyroid Stimulating Horm: 25.3 u[IU]/mL — ABNORMAL HIGH

## 2014-06-26 ENCOUNTER — Encounter: Payer: Self-pay | Admitting: Internal Medicine

## 2014-07-26 ENCOUNTER — Encounter: Payer: Self-pay | Admitting: Internal Medicine

## 2014-08-11 LAB — BASIC METABOLIC PANEL
Anion Gap: 9 (ref 7–16)
BUN: 15 mg/dL (ref 7–18)
CALCIUM: 8.2 mg/dL — AB (ref 8.5–10.1)
CO2: 30 mmol/L (ref 21–32)
CREATININE: 0.89 mg/dL (ref 0.60–1.30)
Chloride: 101 mmol/L (ref 98–107)
EGFR (African American): >60
EGFR (Non-African Amer.): >60
GLUCOSE: 122 mg/dL — AB (ref 65–99)
OSMOLALITY: 282 (ref 275–301)
POTASSIUM: 3.5 mmol/L (ref 3.5–5.1)
Sodium: 140 mmol/L (ref 136–145)

## 2014-08-11 LAB — TSH: Thyroid Stimulating Horm: 7.65 u[IU]/mL — ABNORMAL HIGH

## 2014-08-23 LAB — URINALYSIS, COMPLETE
Bacteria: NONE SEEN
Bilirubin,UR: NEGATIVE
Glucose,UR: NEGATIVE mg/dL (ref 0–75)
KETONE: NEGATIVE
Nitrite: NEGATIVE
Ph: 6 (ref 4.5–8.0)
Protein: NEGATIVE
Specific Gravity: 1.009 (ref 1.003–1.030)
Squamous Epithelial: NONE SEEN

## 2014-08-26 ENCOUNTER — Encounter: Payer: Self-pay | Admitting: Internal Medicine

## 2014-09-26 ENCOUNTER — Encounter: Payer: Self-pay | Admitting: Internal Medicine

## 2014-10-03 LAB — URINALYSIS, COMPLETE
Bilirubin,UR: NEGATIVE
Glucose,UR: NEGATIVE mg/dL (ref 0–75)
Ketone: NEGATIVE
NITRITE: POSITIVE
PH: 6 (ref 4.5–8.0)
Protein: NEGATIVE
RBC,UR: 14 /HPF (ref 0–5)
Specific Gravity: 1.008 (ref 1.003–1.030)
Squamous Epithelial: <1
WBC UR: 107 /HPF (ref 0–5)

## 2014-10-25 ENCOUNTER — Encounter
Admit: 2014-10-25 | Disposition: A | Discharge status: Home / Self Care | Payer: Self-pay | Attending: Internal Medicine | Admitting: Internal Medicine

## 2014-11-25 ENCOUNTER — Encounter
Admit: 2014-11-25 | Disposition: A | Discharge status: Home / Self Care | Payer: Self-pay | Attending: Internal Medicine | Admitting: Internal Medicine

## 2014-12-16 NOTE — Consult Note (Signed)
Brief Consult Note: Diagnosis: AFIB/CHF/CADS.   Patient was seen by consultant.   Consult note dictated.   Recommend further assessment or treatment.   Orders entered.   Discussed with Attending MD.   Comments: IMP AFIB CAD CHF Falls Hyperlipidemia Serotonin syndrome ASCVD Hyponatremia . PLAN Tele Hydration Correct lytes No a good coumadin candidate secondary to falls Rate control for AFIBeart failure management PT/OT ECHO Medical therapy for now.  Electronic Signatures: Dorothyann Pengallwood, Dwayne D (MD)  (Signed [phone]-Nov-14 12:23)  Authored: Brief Consult Note   Last Updated: 02-Nov-14 12:23 by Dorothyann Pengallwood, Dwayne D (MD)

## 2014-12-16 NOTE — Consult Note (Signed)
PATIENT NAME:  [name], [name] MR#:  [id number] DATE OF BIRTH:  09/20/1922  DATE OF CONSULTATION:    REFERRING PHYSICIAN:  Aram BeechamJeffrey Sparks, MD CONSULTING PHYSICIAN:  Mikiyah Glasner D. Juliann Paresallwood, MD  PRIMARY CARE PHYSICIAN: Bethann PunchesMark Miller, MD  INDICATION: Coronary artery disease, CHF, recent falls and atrial fibrillation.  HISTORY OF PRESENT ILLNESS: The patient is an 81 year old white female with history of coronary artery disease, congestive heart failure, hypertension and serotonin syndrome who presented to the Emergency Room with progressive weakness and falls. In the Emergency Room, the patient was found to be hypoxic. She was also noted to have heart failure on chest x-ray. She had a UTI. The patient is hyponatremic. She is frail and elderly and now presents for further evaluation. Was found to be in rapid atrial fibrillation.   PAST MEDICAL HISTORY:  Serotonin syndrome, anemia of chronic disease, anxiety and depression, arteriosclerotic cardiovascular disease, congestive heart failure, irritable bowel syndrome, osteoarthritis, mitral regurgitation, hyperlipidemia, hypertension, coronary artery disease.   MEDICATIONS: Vitamin D once a day, trazodone 50 three times a day, Demadex 10 a day, Protonix 40 twice a day, Synthroid 50 mcg daily, multivitamin 1 p.o. daily, Pravachol 40 a day, Lopressor 25 twice a day, Lotensin 10 twice a day, Ativan 0.25 twice a day and 0.5 mg at bedtime, Cymbalta 30 a day, probiotic 1 tablet twice a day.   ALLERGIES: PENICILLIN, SULFA AND CODEINE.   SOCIAL HISTORY: Negative for alcohol. Retired.  FAMILY HISTORY: Coronary artery disease, stroke.   REVIEW OF SYSTEMS: Weakness, fatigue, falls. No real blackout spells. No significant chest pain. She has been short of breath, dyspneic. Chronic degenerative arthritic changes.   PHYSICAL EXAMINATION: VITAL SIGNS: Blood pressure 150/70, pulse 90, respiratory rate 16, afebrile.  HEENT: Normocephalic, atraumatic. Pupils equal and  reactive to light.  NECK: Supple. No significant JVD, bruits or adenopathy.  LUNGS: Clear to auscultation and percussion. No significant wheeze, rhonchi or rales. HEART: Regular rate and rhythm, 2/6 systolic ejection murmur.  ABDOMEN: Benign.  EXTREMITIES: Within normal limits. She had 2+ leg edema.  NEUROLOGIC: Intact.  SKIN: Normal.   LABORATORY AND DIAGNOSTICS: EKG: Atrial fibrillation. Chest x-ray: Pulmonary edema, congestive heart failure.   Urinalysis 3+ leukocytes. White count 7.3, hemoglobin 12.3. Glucose 117, BUN 10, creatinine 0.94, sodium 129,  ASSESSMENT: 1.  New onset atrial fibrillation.  2.  Congestive heart failure.  3.  Hypoxemia.  4.  Hyponatremia.  5.  Urinary tract infection. 6.  Weakness, fatigue, falls. 7.  Known coronary artery disease.   PLAN: Agree with admit. Rule out for myocardial infarction. Follow up cardiac enzymes. Continue Lasix, ACE inhibitor and beta blocker for heart failure. Echocardiogram may be helpful to assess LV function. Replace potassium and other electrolytes. Follow up cardiac enzymes and EKGs. Follow up chest x-ray for resolution of pulmonary congestion. Antibiotics for urinary tract infection. She needs physical therapy for falls. She may be ataxic because of hyponatremia. Will probably maintain a conservative cardiology course for now and just control atrial fibrillation rate. Will consider whether antiarrhythmics will be necessary. We will hopefully try to control her rate. I do not believe anticoagulation is a reasonable option because of her fall. She would be at too much risk so will probably just maintain just aspirin for now. We will base further assessment on further symptoms. ____________________________ Bobbie Stackwayne D. Juliann Paresallwood, MD ddc:sb D: 06/28/2013 09:46:38 ET T: 06/28/2013 10:33:21 ET JOB#: [id number]  cc: Jakalyn Kratky D. Juliann Paresallwood, MD, <Dictator> Alwyn PeaWAYNE D Atziry Baranski MD ELECTRONICALLY SIGNED 08/02/2013 9:33

## 2014-12-16 NOTE — H&P (Signed)
PATIENT NAME:  [name], [name] MR#:  [id number] DATE OF BIRTH:  [birth year]  DATE OF ADMISSION:  06/26/2013  REFERRING PHYSICIAN: Dr. Darnelle Catalan.   FAMILY PHYSICIAN: Dr. Bethann Punches.   REASON FOR ADMISSION: Weakness with falls.   HISTORY OF PRESENT ILLNESS: The patient is an 81 year old female with a significant history of coronary artery disease, CHF, hypertension and serotonin syndrome, who presents to the Emergency Room with progressive weakness and falls. In the Emergency Room, the patient was found to be hypoxic. She was also noted to have heart failure, on chest x-ray with urinary tract infection. She was also hyponatremic. She is elderly and frail and is now admitted for further evaluation.   PAST MEDICAL HISTORY: 1.  Serotonin syndrome.  2.  Anemia of chronic disease.  3.  Anxiety/depression.  4.  ASCVD.  5.  History of congestive heart failure.  6.  Irritable bowel syndrome.  7.  Osteoarthritis.  8.  Mitral regurgitation by echo.  9. Hyperlipidemia.  10.  Benign hypertension.  11.  Status post hip surgery.  12.  Status post hysterectomy.   MEDICATIONS: 1.  Vitamin D 1000 units p.o. daily.  2.  Trazodone 50 mg p.o. t.i.d.  3.  Demadex 10 mg p.o. daily.  4.  Protonix 40 mg p.o. b.i.d.  5.  Synthroid 50 mcg p.o. daily.  6.  Multivitamin 1 p.o. daily.  7.  Pravachol 40 mg p.o. daily.  8.  Lopressor 25 mg p.o. b.i.d.  9.  Lotensin 10 mg p.o. b.i.d.  10.  Ativan 0.25 mg p.o. b.i.d. and 0.5 mg p.o. at bedtime.  11.  Cymbalta 30 mg p.o. daily.  12.  Probiotic 1 p.o. b.i.d.   ALLERGIES: PENICILLIN, SULFA, AND CODEINE.   SOCIAL HISTORY: Negative for alcohol or tobacco abuse.   FAMILY HISTORY: Positive coronary artery disease and stroke.   REVIEW OF SYSTEMS: CONSTITUTIONAL: No fever or change in weight.  EYES: No blurred or double vision. No glaucoma.  ENT: No tinnitus or hearing loss. No nasal discharge or bleeding. No difficulty swallowing.  RESPIRATORY: The patient  denies cough or wheezing. No hemoptysis.  CARDIOVASCULAR: No chest pain. Some orthopnea. No palpitations. No syncope.  GASTROINTESTINAL: No nausea, vomiting, or diarrhea. No abdominal pain. No change in bowel habits.  GENITOURINARY: No dysuria or hematuria. No incontinence.  ENDOCRINE: No polyuria or polydipsia. No heat or cold intolerance.  HEMATOLOGIC: The patient denies anemia, easy bruising, or bleeding.  LYMPHATIC: No swollen glands.  MUSCULOSKELETAL: The patient has pain in her back, knees, hips and ankles. Some left shoulder pain. No neck pain. Denies gout.  NEUROLOGIC: No numbness or migraines. Denies stroke or seizures. Does have generalized weakness.  PSYCHIATRIC: The patient denies anxiety, insomnia or depression.   PHYSICAL EXAMINATION: GENERAL: The patient is chronically ill-appearing, elderly, but in no acute distress.  VITAL SIGNS: Currently remarkable for a blood pressure of 150/75, with a heart rate of 92, respiratory rate of 20, temperature 98.1 and a sat of 89% on room air.  HEENT: Normocephalic, atraumatic. Pupils equally round and reactive to light and accommodation. Extraocular movements are intact. Sclerae anicteric. Conjunctivae are clear.  OROPHARYNX: Clear.  NECK: Supple without JVD. No adenopathy or thyromegaly is noted.  LUNGS: Reveal basilar rales bilaterally. No wheezes or rhonchi. No dullness. Respiratory effort is mildly increased.  CARDIAC: Regular rate and rhythm with normal S1, S2. There is a 2/6 systolic murmur noted at the apex. No rubs or gallops are present.  ABDOMEN: Soft, nontender with  normoactive bowel sounds. No organomegaly or masses were appreciated. No hernias or bruits were noted.  EXTREMITIES: Stasis changes with 2+ edema. Pulses were 2+ bilaterally.  SKIN: Warm and dry with some mild bruising and abrasions.  NEUROLOGIC: Cranial nerves II through XII grossly intact. Deep tendon reflexes were symmetric. Motor and sensory exam is nonfocal.   PSYCHIATRIC: The patient who was alert and oriented to person, place, and time. She was cooperative and used good judgment.   LABORATORY DATA: EKG revealed new onset atrial fibrillation. Chest x-ray revealed pulmonary edema with congestive heart failure. Urinalysis showed 3+ leukocyte esterase with positive nitrite, 288 WBCs per high-power field, and 1+ bacteria. Her white count was 7.3 with a hemoglobin of 12.3. Glucose 117, with a BUN of 18, creatinine of 0.94 and a sodium of 129. Troponin was 0.03.   ASSESSMENT: 1.  New onset atrial fibrillation.  2.  Acute systolic congestive heart failure, with hypoxia.  3.  Hyponatremia.  4.  Urinary tract infection.  5.  Generalized weakness.  6.  History of coronary artery disease.   PLAN: The patient will be admitted to telemetry as a FULL CODE and started on IV Lasix. We will continue her ACE inhibitor and beta blocker. We will supplement potassium while using IV Lasix. We will follow serial cardiac enzymes and obtain an echocardiogram. We will also consult Cardiology because of her new AFib. We will supplement oxygen at this time. Follow up chest x-ray in the morning. We will send off a urine culture and begin p.o. antibiotics for her urinary tract infection. Follow her sodium closely. Follow up routine labs in the morning. Pulse oximetry on room air in the morning. Further treatment and evaluation will depend upon the patient's progress. We will also get consults from physical therapy and the care management department.   Total time spent on this patient: 50 minutes.    ____________________________ Duane LopeJeffrey D. Judithann SheenSparks, MD jds:dp D: 06/26/2013 12:04:12 ET T: 06/26/2013 12:18:27 ET JOB#: [id number]  cc: Duane LopeJeffrey D. Judithann SheenSparks, MD, <Dictator> Danella PentonMark F. Miller, MD Darion Juhasz Rodena Medin Tresean Mattix MD ELECTRONICALLY SIGNED 06/26/2013 12:36

## 2014-12-16 NOTE — Discharge Summary (Signed)
PATIENT NAME:  [name], Quaniyah MR#:  [id number] DATE OF BIRTH:  Dec 21, 1922  DATE OF ADMISSION:  06/26/2013 DATE OF DISCHARGE:  06/30/2013  DISCHARGE DIAGNOSES:  1.  Metabolic encephalopathy due to urinary tract infection.  2.  Pansensitive Escherichia coli urinary tract infection.  3.  Acute on chronic systolic/diastolic heart failure, ejection fraction of 35%.  4.  Chronic atrial fibrillation.  5.  Coronary artery disease.  6.  Gout.  7.  Irritable bowel syndrome.  8.  Hypertension.   DISCHARGE MEDICATIONS: Align 4 mg b.i.d., metoprolol tartrate 25 mg b.i.d., pravastatin 40 mg at bedtime, pantoprazole 40 mg b.i.d., tramadol 50 mg t.i.d. p.r.n. pain, trazodone 50 mg at bedtime, Cymbalta 30 mg daily, Synthroid 75 mcg daily, vitamin D3 1000 units daily, torsemide 20 mg half tab daily, lorazepam 0.5 mg 1/2 tab a.m., one at bedtime, docusate 100 mg 2 daily, benazepril 10 mg b.i.d. and Ceftin 250 mg b.i.d. x 5 days.   REASON FOR ADMISSION: An 81 year old female, who presents with dyspnea and altered mental status. Please see H and P for HPI, past medical history and physical exam.   HOSPITAL COURSE: The patient was admitted, placed on IV Levaquin and that was switched over to Rocephin for her pansensitive UTI. Her mental status came back to baseline. She was volume overloaded thought due to acute on chronic systolic/diastolic heart failure, ejection fraction found to be 35%. She has moderate MR on echo. She was diuresed with resolution of the edema, although it did give her a gout attack in her legs, treated with IV Solu-Medrol. She was off O2 and was 96% on room air on discharge. Due to her weakness, she will be requiring skilled nursing with OT and PT consults. Her thyroid dose was increased from 50 mcg to 75 mcg due to the TSH of 20.   OVERALL PROGNOSIS: Guarded.  ____________________________ Danella PentonMark F. Onya Eutsler, MD mfm:aw D: 06/30/2013 06:47:06 ET T: 06/30/2013 07:03:52  ET JOB#: [id number]  cc: Danella PentonMark F. Rikita Grabert, MD, <Dictator> Danella PentonMARK F Nicholis Stepanek MD ELECTRONICALLY SIGNED 07/01/2013 17:29

## 2014-12-16 NOTE — Consult Note (Signed)
PATIENT NAME:  [name], [name] MR#:  [id number] DATE OF BIRTH:  [birth year]  DATE OF CONSULTATION:  06/26/2013  REFERRING PHYSICIAN:  Aram BeechamJeffrey Sparks, MD CONSULTING PHYSICIAN:  Hakeem Frazzini D. Juliann Paresallwood, MD  PRIMARY CARE PHYSICIAN: Bethann PunchesMark Miller, MD  INDICATION: Coronary artery disease, CHF, recent falls and atrial fibrillation.  HISTORY OF PRESENT ILLNESS: The patient is an 81 year old white female with history of coronary artery disease, congestive heart failure, hypertension and serotonin syndrome who presented to the Emergency Room with progressive weakness and falls. In the Emergency Room, the patient was found to be hypoxic. She was also noted to have heart failure on chest x-ray. She had a UTI. The patient is hyponatremic. She is frail and elderly and now presents for further evaluation. Was found to be in rapid atrial fibrillation.   PAST MEDICAL HISTORY:  Serotonin syndrome, anemia of chronic disease, anxiety and depression, arteriosclerotic cardiovascular disease, congestive heart failure, irritable bowel syndrome, osteoarthritis, mitral regurgitation, hyperlipidemia, hypertension, coronary artery disease.   MEDICATIONS: Vitamin D once a day, trazodone 50 three times a day, Demadex 10 a day, Protonix 40 twice a day, Synthroid 50 mcg daily, multivitamin 1 p.o. daily, Pravachol 40 a day, Lopressor 25 twice a day, Lotensin 10 twice a day, Ativan 0.25 twice a day and 0.5 mg at bedtime, Cymbalta 30 a day, probiotic 1 tablet twice a day.   ALLERGIES: PENICILLIN, SULFA AND CODEINE.   SOCIAL HISTORY: Negative for alcohol. Retired.  FAMILY HISTORY: Coronary artery disease, stroke.   REVIEW OF SYSTEMS: Weakness, fatigue, falls. No real blackout spells. No significant chest pain. She has been short of breath, dyspneic. Chronic degenerative arthritic changes.   PHYSICAL EXAMINATION: VITAL SIGNS: Blood pressure 150/70, pulse 90, respiratory rate 16, afebrile.  HEENT: Normocephalic, atraumatic. Pupils  equal and reactive to light.  NECK: Supple. No significant JVD, bruits or adenopathy.  LUNGS: Clear to auscultation and percussion. No significant wheeze, rhonchi or rales. HEART: Regular rate and rhythm, 2/6 systolic ejection murmur.  ABDOMEN: Benign.  EXTREMITIES: Within normal limits. She had 2+ leg edema.  NEUROLOGIC: Intact.  SKIN: Normal.   LABORATORY AND DIAGNOSTICS: EKG: Atrial fibrillation. Chest x-ray: Pulmonary edema, congestive heart failure.   Urinalysis 3+ leukocytes. White count 7.3, hemoglobin 12.3. Glucose 117, BUN 10, creatinine 0.94, sodium 129,   ASSESSMENT: 1.  New onset atrial fibrillation.  2.  Congestive heart failure.  3.  Hypoxemia.  4.  Hyponatremia.  5.  Urinary tract infection. 6.  Weakness, fatigue, falls. 7.  Known coronary artery disease.   PLAN: Agree with admit. Rule out for myocardial infarction. Follow up cardiac enzymes. Continue Lasix, ACE inhibitor and beta blocker for heart failure. Echocardiogram may be helpful to assess LV function. Replace potassium and other electrolytes. Follow up cardiac enzymes and EKGs. Follow up chest x-ray for resolution of pulmonary congestion. Antibiotics for urinary tract infection. She needs physical therapy for falls. She may be ataxic because of hyponatremia. Will probably maintain a conservative cardiology course for now and just control atrial fibrillation rate. Will consider whether antiarrhythmics will be necessary. We will hopefully try to control her rate. I do not believe anticoagulation is a reasonable option because of her fall. She would be at too much risk so will probably just maintain just aspirin for now. We will base further assessment on further symptoms.  ____________________________ Bobbie Stackwayne D. Juliann Paresallwood, MD ddc:sb D: 06/28/2013 09:46:00 ET T: 06/28/2013 10:34:44 ET JOB#: [id number]  cc: Kwinton Maahs D. Juliann Paresallwood, MD, <Dictator> Alwyn PeaWAYNE D Nainoa Woldt MD ELECTRONICALLY SIGNED 08/02/2013  9:33 

## 2014-12-17 NOTE — Discharge Summary (Signed)
PATIENT NAME:  [name], [name] MR#:  [id number] DATE OF BIRTH:  27-Sep-1922  DATE OF ADMISSION:  02/26/2014 DATE OF DISCHARGE:  03/01/2014  DISCHARGE DIAGNOSES:  1.  Acute respiratory failure secondary to congestive heart failure.  2.  Acute on chronic systolic congestive heart failure.  3.  Rapid atrial fibrillation.  4.  Osteoarthritis.  5.  Mild to moderate dementia.  6.  Arteriosclerotic cardiovascular disease.   DISCHARGE MEDICATIONS: 1.  Synthroid 75 mcg daily.  2. Vitamin D3 1000 units daily.  3.  Pravachol 40 mg at bedtime. 4.  Ceftin 250 mg b.i.d. x10 days. 5.  Colace 100 mg 2 daily. 6.  Toprol-XL 50 mg b.i.d. 7.  Lorazepam 0.5 mg q. 8 p.[name].n. anxiety. 8.  Aspirin 325 mg daily. 9.  Ranitidine 150 mg b.i.d. 10.  Demadex 10 mg daily. 11.  Norco 5/325 one q. 12 p.[name].n. pain.   REASON FOR ADMISSION: An 81 year old female who presents with respiratory failure from rapid A-fib and CHF. Please see H and P for HPI, past medical history, and physical exam.   HOSPITAL COURSE: The patient was admitted. Her heart rate was controlled with Toprol-XL. She was IV diuresed. Her oxygenation improved. She still is off and on 2 liters at times. Cardiac enzymes showed demand ischemia, no infarct. EF found to be 25% to 30%. She is intolerant and allergic to ACE inhibitors and ARBs secondary to acute renal failure and will be on the above medications. She is post pneumonia and will continue on her Ceftin. She is not a Coumadin candidate for A-fib due to multiple falls and thus will remain on aspirin. She overall has a failing health, overall prognosis is poor, and will be going to skilled rehab for PT and OT this morning.   ____________________________ Danella PentonMark F. Brolin Dambrosia, MD mfm:sb D: 03/01/2014 08:05:20 ET T: 03/01/2014 09:09:05 ET JOB#: [id number]  cc: Danella PentonMark F. Isac Lincks, MD, <Dictator> Anelly Samarin Sherlene ShamsF Averie Meiner MD ELECTRONICALLY SIGNED 03/02/2014 7:05

## 2014-12-17 NOTE — Discharge Summary (Signed)
PATIENT NAME:  [name] BonierGARDNER, Dariah R MR#:  [id number] DATE OF BIRTH:  1923-05-29  DATE OF ADMISSION:  02/26/2014 DATE OF DISCHARGE:  03/01/2013  DISCHARGE DIAGNOSES:  1.  Acute respiratory failure secondary to congestive heart failure.  2.  Acute on chronic systolic congestive heart failure.  3.  Rapid atrial fibrillation.  4.  Osteoarthritis.  5.  Mild to moderate dementia.  6.  Arteriosclerotic cardiovascular disease.   DISCHARGE MEDICATIONS: 1.  Synthroid 75 mcg daily.  2.  Vitamin D3 1000 units daily.  3.  Pravachol 40 mg at bedtime. 4.  Ceftin 250 mg b.i.d. x10 days. 5.  Colace 100 mg 2 daily. 6.  Toprol-XL 50 mg b.i.d. 7.  Lorazepam 0.5 mg q. 8 p.r.n. anxiety.  8.  Aspirin 325 mg daily. 9.  Ranitidine 150 mg b.i.d. 10.  Demadex 10 mg daily. 11.  Norco 5/325 one q. 12 p.r.n. pain.   REASON FOR ADMISSION: An 81 year old female who presents with respiratory failure from rapid A-fib and CHF. Please see H and P for HPI, past medical history, and physical exam.   HOSPITAL COURSE: The patient was admitted. Her heart rate was controlled with Toprol-XL. She was IV diuresed. Her oxygenation improved. She still is off and on 2 liters at times. Cardiac enzymes showed demand ischemia, no infarct. EF found to be 25% to 30%. She is intolerant and allergic to ACE inhibitors and ARBs secondary to acute renal failure and will be on the above medications. She is post pneumonia and will continue on her Ceftin. She is not a Coumadin candidate for A-fib due to multiple falls and thus will remain on aspirin. She overall has a failing health. Overall prognosis is poor and will be going to skilled rehab for PT and OT this morning.   ____________________________ Danella PentonMark F. Miller, MD mfm:sb D: 03/01/2014 08:05:20 ET T: 03/01/2014 09:05:56 ET JOB#: [id number]  cc: Danella PentonMark F. Miller, MD, <Dictator> MARK Sherlene ShamsF MILLER MD ELECTRONICALLY SIGNED 03/02/2014 7:05

## 2014-12-17 NOTE — H&P (Signed)
PATIENT NAME:  [name], [name] MR#:  [id number] DATE OF BIRTH:  03/07/1923  DATE OF ADMISSION:  02/21/2014  PRIMARY CARE PHYSICIAN: Dr. Bethann PunchesMark Miller.   REFERRING PHYSICIAN: Dr. Sharman CheekPhillip Stafford.   CHIEF COMPLAINT: Altered mental status, cough, shortness of breath.   HISTORY OF PRESENT ILLNESS: [name] is an 81 year old female with a past medical history of depression, anxiety, osteoarthritis, coronary artery disease, who seems to be having underlying dementia, is brought to the Emergency Department by his family with cough, shortness of breath. No family members are available at the bedside. The patient is extremely confused, unable to obtain any history from the patient. The patient's sitter, who has been with her for the last 3 weeks, states that the patient is usually confused at the time of waking up. At baseline, walks with the help of a walker, communicates minimally. Noticed to have somewhat decreased p.o. intake. This morning, the patient was found to be coughing and found to have a fever. Concerning this, the patient is brought to the Emergency Department.   WORKUP IN THE EMERGENCY DEPARTMENT: The patient is found to have fever of 100.4 and a pulse rate of 108 at. Chest x-ray shows marked cardiomegaly with a probable mild interstitial edema. The patient received levofloxacin in the Emergency Department. The patient seems to be quite dehydrated.   PAST MEDICAL HISTORY: 1. Mitral regurgitation.  2. Osteoarthritis.  3. Hypertension.  4. Hyperlipidemia.  5. Coronary artery disease.  6. Congestive heart failure.  7. Serotonin syndrome.  8. Anemia.  9. Depression.  10. Irritable bowel syndrome.   PAST SURGICAL HISTORY: 1. Hysterectomy.  2. Hip replacement.   ALLERGIES:  1. PENICILLIN.  2. ZOLOFT.  3. CODEINE.   HOME MEDICATIONS: 1. Vitamin D3 at 1 tablet once a day.  2. Trazodone 50 mg once a day.  3. Synthroid 75 mcg once a day.  4. Pravastatin 40 mg once a  day. 5. Pravachol 40 mg once a day.  6. Protonix 40 mg once a day.  7. Metoprolol 125 mg 2 times a day.  8. Lorazepam 0.5 mg once a day.  9. Effexor XR 75 mg once a day.  10. Docusate sodium 2 capsules once a day.  11. Ativan 0.5 mg 3 times a day.  12. Align 4 mg 2 times a day.   SOCIAL HISTORY: Per her records, no history of tobacco, alcohol or drug use. Married, lives with her husband. Has sitters around the clock for the patient.   FAMILY HISTORY: Positive for coronary artery disease and stroke.   REVIEW OF SYSTEMS: Unable to obtain from the patient, as in complete altered mental status.   PHYSICAL EXAMINATION: GENERAL: This is a thin-built, chronically ill-looking female lying down in the bed, completely confused.  VITAL SIGNS: Temperature 100.4, pulse 108, blood pressure 141/125, respiratory rate of 20, oxygen saturation 99% on room air.  HEENT: Head normocephalic, atraumatic. There is no scleral icterus. Conjunctivae normal. Pupils equal and react to light. Mucous membranes are dry. Could not examine the oropharynx.  NECK: Supple. No lymphadenopathy. No JVD. No carotid bruit.  CHEST: No focal tenderness. Lungs bilaterally clear to auscultation.  HEART: S1, S2 regular. No murmurs are heard.  ABDOMEN: Bowel sounds present. Soft, nontender, nondistended.  EXTREMITIES: No pedal edema. Pulses 2+. Multiple bruises.  NEUROLOGIC: The patient is not oriented to place, person or time. No apparent cranial nerve abnormalities. Could not examine the motor and sensory.  SKIN: Multiple bruises.   CMP is  completely within normal limits. TSH 14. Troponin 0.03. CK 63, CK-MB of 1.8.   ASSESSMENT AND PLAN: [name] an 81 year old female who comes to the Emergency Department with pneumonia.  1. Pneumonia. Will treat it as a community-acquired pneumonia. Cannot exclude aspiration pneumonia. Will obtain swallow sensation in the morning. The patient type cannot follow any commands.  2. Altered  mental status. Most likely from the infection. As per previous records, does not indicate patient has any history of dementia; however, sitter states that the patient's mental status seems to be somewhat at her baseline. Will hold all sedative medications and treat the underlying infection.  3. Severe debility: Will involve physical therapy.  4. Hypothyroidism. The patient has TSH of 14. The patient is on Synthroid. Start back on home dose and follow up.  5. Keep the patient on deep vein thrombosis prophylaxis with Lovenox.   TIME SPENT: 55 minutes.    ____________________________ Susa Griffins, MD pv:lm D: 02/22/2014 00:20:49 ET T: 02/22/2014 05:04:57 ET JOB#: [id number]  cc: Susa Griffins, MD, <Dictator> Susa Griffins MD ELECTRONICALLY SIGNED 03/03/2014 0:28

## 2014-12-17 NOTE — Discharge Summary (Signed)
PATIENT NAME:  [name], [name] MR#:  [id number] DATE OF BIRTH:  [birth year]  TRANSFER SUMMARY  DATE OF ADMISSION:  02/21/2014 DATE OF DISCHARGE:  02/25/2014   HISTORY OF PRESENT ILLNESS: [name] is an 81 year old white lady who was brought to the Emergency Room with cough and shortness of breath. In the Emergency Room, the patient was noted to be tachypneic and to have a fever of 104. Chest x-ray showed cardiomegaly and probable interstitial edema. The admitting diagnosis was suspected aspiration pneumonia.   PAST MEDICAL HISTORY: Notable for mitral regurgitation, osteoarthritis, hypertension, hyperlipidemia, coronary artery disease, a history of congestive heart failure, a history of serotonin syndrome, anemia, chronic depression and irritable bowel syndrome.   PAST SURGICAL HISTORY: Included a previous hysterectomy and a previous hip replacement.   ALLERGIES: THE PATIENT WAS NOTED TO BE ALLERGIC TO PENICILLIN, ZOLOFT AND CODEINE.   MEDICATIONS ON ADMISSION: Included vitamin D3 1000 units daily, trazodone 50 mg daily, Synthroid 75 mcg daily, pravastatin 40 mg daily, Pravachol 40 mg daily, Protonix 40 mg daily, metoprolol 12.5 mg b.i.d., lorazepam 0.5 mg daily, Effexor-XR 75 mg daily, Ativan 0.5 mg t.i.d. and Align 4 mg twice a day.   ADMISSION PHYSICAL EXAMINATION:  GENERAL: Revealed a chronically ill lady who was in respiratory distress.  VITAL SIGNS: Temperature was 100.4. Pulse was 108. There was a typographical error on the admission blood pressure. Respiratory rate was 20. Oxygen saturation was 99%.  HEENT: Mucous membranes were dry.  LUNGS: Auscultation of the lungs revealed no rales, rhonchi or wheezes.  CARDIAC: Revealed a regular rhythm without murmur or gallop.  EXTREMITIES: There was no peripheral edema.  SKIN: The patient was noted to have multiple bruising.  NEUROLOGIC: The patient was disoriented to person, place and time, but had no focal neurological findings.    HOSPITAL COURSE: The patient was admitted to the regular hospital floor, where she was treated with IV antibiotics. It was noted to be a community-acquired pneumonia, although aspiration pneumonia was suspected. She was noted to have a metabolic encephalopathy secondary to the infection. At the time of discharge, she remained pleasantly demented. On admission, the patient was noted to have a mildly elevated TSH. She was on Synthroid. Followup as an outpatient was planned.   The patient's hospital course was one of slow but gradual improvement. She was gradually weaned from oxygen. She was switched to p.o. antibiotics and remained afebrile.   DISCHARGE DIAGNOSES:  1. Community-acquired pneumonia.  2. Metabolic encephalopathy.   DISCHARGE MEDICATIONS:  1. Protonix 40 mg b.i.d.  2. Synthroid 75 mcg daily.  3. Vitamin D3 1000 units daily.  4. Colace 100 mg 2 tablets in the morning and one in the evening.  5. Ativan 0.5 mg t.i.d.  6. Effexor-XR 75 mg daily.  7. Pravachol 40 mg daily.  8. Metoprolol succinate 50 mg b.i.d.  9. Ceftin 250 mg b.i.d.  Align and trazodone were discontinued.   DISCHARGE INSTRUCTIONS: The patient was discharged on a low-sodium diet, with activity as tolerated. She is being returned to the extended care facility where she resides.   ____________________________ Letta PateJohn B. Danne HarborWalker III, MD jbw:lb D: 02/25/2014 11:24:58 ET T: 02/25/2014 11:37:25 ET JOB#: [id number]  cc: Jonny RuizJohn B. Danne HarborWalker III, MD, <Dictator> Elmo PuttJOHN B WALKER III MD ELECTRONICALLY SIGNED 02/26/2014 18:33

## 2014-12-17 NOTE — Consult Note (Signed)
Comments   I met with pt's husband. He has many concerns regarding pt's previous healthcare. We talked about various options for her disposition including SNF vs memory care vs home with continued 24/7 aides. We also talked about hospice involvement although he was less sure about this given prevoius encounters with hospice and feeding at end of life. Husband would like patient to go to SNF and I would recommend she be followed by the palliative care NP there. We also talked about code status. Husband does not think she should be resuscitated in the event of cardiopulmonary arrest. He says he would agree with DNR but first wants to talk to his daughter and pt's brother before implementing this. All questions answered. 30 minutes  Electronic Signatures: Borders, Joshua R (NP)  (Signed 01-Jul-15 15:46)  Authored: Palliative Care Phifer, Nancy (MD)  (Signed 01-Jul-15 20:57)  Authored: Palliative Care   Last Updated: 01-Jul-15 20:57 by Phifer, Nancy (MD) 

## 2014-12-17 NOTE — Consult Note (Signed)
PATIENT NAME:  [name], [name] MR#:  [id number] DATE OF BIRTH:  [birth year]  DATE OF CONSULTATION:  02/26/2014  REFERRING PHYSICIAN:  Dr. Winona Legato. CONSULTING PHYSICIAN:  Lamar Blinks, MD  REASON FOR CONSULTATION: Pneumonia, heart failure, coronary artery disease, elevation of troponin, atrial fibrillation, hypertension, hyperlipidemia, pleural effusions.   CHIEF COMPLAINT: The patient is unable to it gives information at this time due to obtundation.  HISTORY OF PRESENT ILLNESS: This is an 81 year old female with known cardiovascular disease, coronary artery disease, previous history of heart failure with recent admission for pneumonia and hypoxia for which she was receiving appropriate medications. She was discharged to home for rehabilitation and ended up with significant amount of worsening shortness of breath, weakness, fatigue, as well as a pulmonary edema and hypoxia. At that time, she was brought to the Emergency Room and had significant evidence of pulmonary edema and atrial fibrillation with rapid ventricular rate. She also has pleural effusions, likely causing her current condition of hypoxia. There was an elevation of troponin at 0.95, most consistent with demand ischemia and no current evidence of myocardial infarction. The patient does have an EKG showing atrial fibrillation with nonspecific ST changes with reasonable heart rate control on metoprolol. The patient was given Lasix with improvement of hypoxia and oxygenation as well. The patient does not have any hypotension at this time.   REVIEW OF SYSTEMS: The remainder of review of systems is unable to be assessed due to  obtundation.   PAST MEDICAL HISTORY:  1.  Heart failure.  2.  Coronary artery disease.  3.  Hypertension.  4.  Atrial fibrillation.  5.  Pleural effusion.  6.  Mitral regurgitation.   FAMILY HISTORY: No family members with early onset of cardiovascular disease or hypertension.   SOCIAL HISTORY: The  patient has no alcohol or tobacco use.   ALLERGIES: AS LISTED.   MEDICATIONS: As listed.   PHYSICAL EXAMINATION:  VITAL SIGNS: Blood pressure is 142/68 bilaterally. Heart rate is 95 upright, reclining, and irregular.  GENERAL: She is an ill-appearing elderly female in mild acute distress.  HEENT: No icterus, thyromegaly, ulcers, hemorrhage, or xanthelasma.  CARDIOVASCULAR: Irregularly irregular with normal S1 and S2. A 2-3/6 apical murmur consistent with mitral regurgitation. PMI is laterally displaced. Carotid upstroke normal without bruit. Jugular venous pressure is slightly elevated.  LUNGS: Decreased basilar breath is a little breath sounds with expiratory wheezes.  ABDOMEN: Soft, nontender. No apparent hepatosplenomegaly or masses. Abdominal aorta is normal size without bruit.  EXTREMITIES: Two plus radial, trace femoral and dorsal pedal pulses with trace lower extremity edema. No cyanosis, clubbing, or ulcers.  NEUROLOGIC: She is obtunded.   ASSESSMENT: An 81 year old female with acute pneumonia, hypoxia, heart failure, pleural effusions hypertension with atrial fibrillation with rapid ventricular rate and needing further treatment options with elevated troponin consistent with demand ischemia.   RECOMMENDATIONS:  1.  Continue serial ECG and enzymes to assess for possible continued demand ischemia.  2.  Heart rate control with metoprolol of atrial fibrillation.  3.  Anticoagulation for further risk reduction in stroke with atrial fibrillation.  4.  Lasix per intravenous so that the patient has reduced pleural effusions and pulmonary edema and hypoxia.  5.  Continue treatment of pneumonia with antibiotics and inhalers with oxygen.  6.  Consider echocardiogram for further evaluation of extent of concerns of LV systolic dysfunction and heart failure.  7.  Further ambulation with adjustments in his medications.    ____________________________ Lamar Blinks,  MD  bjk:ts D: 02/26/2014 09:59:16 ET T: 02/26/2014 11:36:10 ET JOB#: [id number]  cc: Lamar BlinksBruce J. Amair Shrout, MD, <Dictator> Lamar BlinksBRUCE J Eleno Weimar MD ELECTRONICALLY SIGNED 02/28/2014 13:22

## 2014-12-17 NOTE — H&P (Signed)
PATIENT NAME:  [name], [name] MR#:  [id number] DATE OF BIRTH:  1923-02-12  DATE OF ADMISSION:  02/26/2014  PRIMARY CARE PHYSICIAN: Danella PentonMark F. Miller, MD  REFERRING PHYSICIAN: Enedina Finnerandolph N. Manson PasseyBrown, MD  CHIEF COMPLAINT: Shortness of breath and hypoxia.   HISTORY OF PRESENT ILLNESS: The patient is an 81 year old female with past medical history of coronary artery disease, congestive heart failure, hypertension and multiple other medical problems, who was just admitted to the hospital with pneumonia and was discharged to skilled nursing facility on July 3rd. After her discharge, the patient was still short of breath and progressively has gotten worse. The patient became hypoxic, with a pulse oximetry in mid 80s on room air yesterday evening. The patient was extremely short of breath, and EMS was called. The patient was given some breathing treatments, and she was placed on mask, and she is brought into the ED. In the ED, on 4 liters, she was again desaturating, and she was placed on nonrebreather. Blood cultures were obtained, and she was started on cefepime, vancomycin and levofloxacin for healthcare-associated pneumonia. Her troponin during the previous admission was normal, but it is elevated at 0.95 during this admission. EKG has revealed atrial fibrillation with RVR. As there is an uptrend in the troponin since previous admission, the patient is started on heparin drip. During my examination, the patient is still uncomfortable and short of breath. Daughter is at bedside, and the patient is stating that she is tired and she wants to be off the mask. After talking to the daughter, the patient was convinced to continue wearing the nonrebreather mask.   PAST MEDICAL HISTORY:  1. Mitral regurgitation. 2. Osteoarthritis. 3. Hypertension.  4. Hyperlipidemia.  5. Coronary artery disease.  6. Congestive heart failure. 7. Serotonin syndrome.  8. Anemia.  9. Depression.  10. Irritable bowel syndrome.    PAST SURGICAL HISTORY:  1. Hysterectomy. 2. Hip replacement.   ALLERGIES: PENICILLIN, ZOLOFT AND CODEINE.   PSYCHOSOCIAL HISTORY: No history of smoking, alcohol or illicit drug usage. [facility] and [facility].   FAMILY HISTORY: Coronary artery disease and stroke runs in her family.   REVIEW OF SYSTEMS: Unobtainable as the patient is under acute respiratory distress.   HOME MEDICATIONS:  1. Vitamin D3 1000 international units 1 tablet p.o. once daily. 2. Synthroid 75 mcg once daily. 3. Pravachol 40 mg p.o. once daily. 4. Pantoprazole 40 mg p.o. b.i.d. 5. Metoprolol succinate 50 mg p.o. b.i.d. 6. Effexor 75 mg p.o. once daily. 7. Colace 100 mg p.o. b.i.d. 8. Ceftin 250 mg p.o. b.i.d. 9. Ativan 0.5 mg p.o. 3 times a day.   PHYSICAL EXAMINATION:  VITAL SIGNS: Temperature 99 degrees Fahrenheit, pulse 103, respirations 25, blood pressure 146/102, pulse oximetry is 97% on nonrebreather.  GENERAL APPEARANCE: Uncomfortable because of the mask and shortness of breath.  HEENT: Normocephalic, atraumatic. Pupils are equally reacting to light and accommodation. No scleral icterus. No conjunctival injection. No sinus tenderness. Dry mucous membranes.  NECK: Supple. No JVD. No thyromegaly. Spontaneously moving her neck.  CARDIOVASCULAR: Irregularly irregular. Positive murmur.  PULMONARY: Moderate air entry with crackles, rales and rhonchi.  GASTROINTESTINAL: Soft. Bowel sounds are positive in all 4 quadrants. Nontender, nondistended. No hepatosplenomegaly. No masses.  NEUROLOGIC: Awake and alert. Oriented x1 to 2. Reflexes are 2+. Could not elicit motor and sensory as the patient is uncomfortable with the mask and not quite cooperative.  EXTREMITIES: No edema. No cyanosis. No clubbing.  SKIN: Warm to touch.  Normal turgor. No rashes. No lesions.   LABORATORY AND IMAGING STUDIES: Accu-Chek 161, glucose 108, BUN, creatinine and sodium are normal, potassium is  5.9, chloride 109, CO2 23, GFR greater than 60, anion gap 9, serum osmolality and calcium are normal, magnesium is 2.0, lipase 71. CPK-MB 4.8, troponin 0.95. Her troponin was normal on June 29th. CBC is normal. Urinalysis: Amber color, clear in appearance, trace ketones, nitrites are negative, leukocyte esterase is negative. Prothrombin time is 15.6, INR 1.3, activated PTT is 40.4. Albumin is 2.9, alkaline phosphatase 140, AST is elevated at 70. Chem-8: Glucose 108. A 12-lead EKG: Atrial fibrillation with RVR at 113. Portable chest x-ray has revealed cardiomegaly with pulmonary venous congestion and small bilateral pleural effusion. Bibasilar opacities that could reflect atelectasis or pneumonia.   ASSESSMENT AND PLAN: An 81 year old Caucasian female sent [facility] for shortness of breath and hypoxia. Will be admitted with the following assessment and plan.   1. Acute respiratory distress with hypoxia secondary to healthcare-associated pneumonia as well as atrial fibrillation with rapid ventricular response. Pan-cultures were obtained. The patient is started on IV cefepime, vancomycin and levofloxacin. The patient is currently on nonrebreather, refusing BiPAP. Palliative care consult is placed.  2. Non-ST elevation myocardial infarction versus demand ischemia. Cycle cardiac biomarkers. The patient is on heparin drip. Cardiology consult is placed to [facility].  3. Hyperkalemia. Will provide her Kayexalate.  4. Coronary artery disease. Currently, the patient is under respiratory distress with elevated troponin. Will provide her aspirin, beta blocker and statin, and a cardiology consult is placed. 5. Congestive heart failure. She has pleural effusions which could be parapneumonic effusions. Will provide her Lasix.  6. Hypertension. Provide antihypertensive and titrate as needed.   CODE STATUS: She is DNR. Daughter is the medical power of attorney.    Transfer the patient to Dr. Hyacinth Meeker.   Plan of care discussed with the patient's daughter at bedside. She agreed with the plan.   TOTAL CRITICAL CARE TIME SPENT: 50 minutes.    ____________________________ Ramonita Lab, MD ag:lb D: 02/26/2014 08:05:41 ET T: 02/26/2014 08:26:17 ET JOB#: [id number]  cc: Ramonita Lab, MD, <Dictator> Danella Penton, MD Ramonita Lab MD ELECTRONICALLY SIGNED 03/12/2014 2:07

## 2014-12-18 NOTE — Consult Note (Signed)
I will sign off, please reconsult if I can be of service.  Electronic Signatures: Scot JunElliott, Rhen Kawecki T (MD)  (Signed on 18-Jun-13 09:45)  Authored  Last Updated: 18-Jun-13 09:45 by Scot JunElliott, Rosalita Carey T (MD)

## 2014-12-18 NOTE — Consult Note (Signed)
Pt with at fib, on coumadin, recent fall in hgb, getting transfused.  Plan to do EGD ;tomorrow if INR is 1.5 or below and electrolytes ok.  Pt denies abd pain or SOB at this time.  Electronic Signatures: Scot JunElliott, Nataliah Hatlestad T (MD)  (Signed on 13-Jun-13 20:52)  Authored  Last Updated: 13-Jun-13 20:52 by Scot JunElliott, Juana Haralson T (MD)

## 2014-12-18 NOTE — Discharge Summary (Signed)
PATIENT NAME:  [name], [name] MR#:  [id number] DATE OF BIRTH:  [birth year]  DATE OF ADMISSION:  10/13/2011 DATE OF DISCHARGE:  10/15/2011  DISCHARGE DIAGNOSES:  1. Aspiration pneumonitis.  2. Hyponatremia, symptomatic.  3. Urinary tract infections, Escherichia coli. 4. Right ankle crush injury.  5. Atherosclerotic cardiovascular disease.   6. Hypertension.  7. Hypothyroidism.  8. Iron deficiency anemia.  9. Chronic atrial fibrillation.   DISCHARGE MEDICATIONS:  1. Milk of Magnesia 30 mL daily p.r.n. constipation. 2. Aspirin 81 mg daily. 3. Docusate 200 mg daily.  4. Hydralazine 25 mg t.i.d.  5. Norco 5/325 one q.8 p.r.n. pain. 6. Levothyroxine 50 mcg daily.  7. Ativan 0.5 mg t.i.d.  8. Lopressor 25 mg b.i.d.  9. Pravastatin 40 mg at bedtime.  10. Trazodone 75 mg at bedtime.  11. Vitamin D 1000 units daily.  12. Nitrostat 0.4 mg sublingual p.r.n. chest pain. 13. Benazepril 10 mg daily.  14. Tylenol 650 mg q.4 p.r.n.   15. Levaquin 250 mg daily x5 days.  16. Ferrous sulfate 324 mg daily.  17. Protonix 40 mg daily.   REASON FOR ADMISSION: The patient is an 81 year old female who presented with mild hypoxia and hyponatremia with generalized weakness. Please see history and physical for history of present illness, past medical history, and physical exam.   HOSPITAL COURSE: The patient was admitted and treated for presumed aspiration pneumonia with IV clindamycin. Her chest x-ray did not show an infiltrate but certainly she had aspirated prior to admission. Speech therapy saw her and suggested dysphagia III diet with nectar thick liquids and will continue to follow her. Her urinary tract infection was treated with p.o. Levaquin and the culture grew Escherichia coli, pansensitive. Her sodium initially was in the low 120's and was 126 on discharge. She will be on fluid restriction with follow-up labs. Dr. Alberteen Spindleline of Podiatry saw her ankle and will be changing the dressings. She will  follow-up with Dr. Victorino [place]Hewitt [place]. Her blood pressure medicines initially were held and then re-added as she got to feeling better. She will go back to skilled nursing.   OVERALL PROGNOSIS: Guarded.   She will stay off Coumadin post crush injury and multiple falls with her atrial fibrillation. The family understands the risk of thromboembolism off Coumadin.  ____________________________ Danella PentonMark F. Miller, MD mfm:drc D: 10/15/2011 07:46:08 ET T: 10/15/2011 08:11:42 ET JOB#: [id number]  cc: Danella PentonMark F. Miller, MD, <Dictator> MARK Sherlene ShamsF MILLER MD ELECTRONICALLY SIGNED 10/16/2011 8:06

## 2014-12-18 NOTE — Consult Note (Signed)
Chest clear, abd slight tenderness in LLQ, hx of diverticulosis and adhesions.  No nausea or vomiting, INR of 1.6, hgb 9.1, Plt of 138.  Plan EGD tomorrow, will likely advance diet after that.   Electronic Signatures: Scot JunElliott, Carra Brindley T (MD)  (Signed on 16-Jun-13 09:02)  Authored  Last Updated: 16-Jun-13 09:02 by Scot JunElliott, Asiah Browder T (MD)

## 2014-12-18 NOTE — Consult Note (Signed)
PATIENT NAME:  [name], Deseray MR#:  [id number] DATE OF BIRTH:  August 29, 1922  DATE OF CONSULTATION:  02/06/2012  REFERRING PHYSICIAN:   CONSULTING PHYSICIAN:  Cala BradfordKimberly A. Arvilla MarketMills, ANP  ADDENDUM: The patient was reevaluated a couple hours after initial interview. She does report epigastric discomfort and daughter confirms that she has been complaining about her stomach hurting for a couple of days. On physical exam, she has epigastric tenderness, more so of the left upper quadrant than on the right. Daughter was advised of plans for tentative EGD on 02/07/2012 and she is in agreement with plans. I discussed the procedure with her.  ____________________________ Ranae PlumberKimberly A. Arvilla MarketMills, ANP kam:slb D: 02/06/2012 14:57:38 ET T: 02/06/2012 15:57:11 ET JOB#: [id number]  cc: Cala BradfordKimberly A. Arvilla MarketMills, ANP, <Dictator> Ranae PlumberKimberly A. Suzette BattiestMills RN, MSN, ANP-BC Adult Nurse Practitioner ELECTRONICALLY SIGNED 02/06/2012 16:31

## 2014-12-18 NOTE — Consult Note (Signed)
PATIENT NAME:  [name], [name] MR#:  [id number] DATE OF BIRTH:  November 01, 1922  DATE OF CONSULTATION:  02/06/2012  REFERRING PHYSICIAN:  Bethann Punches, MD CONSULTING PHYSICIAN:  Lynnae Prude, MD / Ranae Plumber. Arvilla Market, ANP  REASON FOR CONSULTATION: Gastrointestinal bleed.   HISTORY OF PRESENT ILLNESS: This 81 year old patient with a history of atrial fibrillation on chronic anticoagulation presented to the [facility] office yesterday with progressive leg edema, some shortness of breath, and hemoglobin was noted to be 7.3, down from baseline of 12.9 in April. The patient's INR was 2.6. She was admitted to the hospital today for further evaluation and management. The patient had mentioned that she was passing black stools. Gastroenterology is consulted for further evaluation and management.   The patient is complaining of severe pruritus.  She just received IV morphine dose in her forearm and is complaining of severe generalized pruritus. She appears very anxious. Nursing reports she has had pruritus before the medication was given for left leg pain.  The patient is unable to focus well on GI questions, but reports she thinks she saw some black stools recently. She is unable to give any further history. She denies any abdominal pain, change in bowel habits, and denies any upper gastrointestinal complaints such as nausea, vomiting, dysphagia, heartburn, or abdominal pain. Chart review shows that the patient has had a flexible sigmoidoscopy June 2006 for diarrhea with findings of prominent veins. She had a full colonoscopy attempt on 11/27/2001 for iron deficiency anemia evaluation, successful to the descending colon showing diverticulosis. The procedure was aborted due to restricted mobility of the colon. A barium enema study was performed in 2003 that showed diffuse diverticulosis.   PAST MEDICAL HISTORY:  1. Atrial fibrillation on chronic warfarin therapy.  2. Coronary artery disease with myocardial  infarction in 2009, status post stents x2, bare metal.  3. Hypertension.  4. Hyperlipidemia.  5. Mitral regurgitation.  6. Osteoarthritis.  7. Irritable bowel syndrome.  8. Depression and anxiety.  9. Delirium.  10. Sleep apnea, mild.  11. Serotonin syndrome, October 2008.  12. Renal insufficiency.  13. Anemia.  14. Congestive heart failure.  15. Deep vein thrombosis.  16. History of renal artery stents.   PAST SURGICAL HISTORY:  1. Hip replacement in 1994.  2. CABG in 1988.  3. Endometriosis laparoscopic surgery in 1954.  4. Hysterectomy in 1962.   ADMISSION MEDICATIONS: The patient is unable to list. Admission history and physical includes the following. 1. Cymbalta 30 mg daily.  2. Torsemide 10 mg daily.  3. Coumadin 2.5 mg at bedtime, currently holding.  4. Pravachol 40 mg at bedtime.  5. Prilosec 20 mg daily.  6. Synthroid 50 mcg daily.  7. Tramadol 50 mg 1 to 2 tablets twice daily.  8. Benazepril 10 mg twice daily.  9. Ativan 0.5 mg 1/2 to 1 tablet three times daily as needed.  10. Trazodone 50 mg at bedtime p.r.n.  11. Multivitamin daily.  12. Toprol-XL 25 mg twice daily.  13. Aspirin 81 mg daily, currently holding. 14. Align 4 mg capsule twice daily.  15. Senna Plus 1/2 tablet daily.  16. Colace 2 tablets daily.   ALLERGIES: Penicillin, Zoloft, codeine, and amitriptyline.   FAMILY HISTORY: Positive for breast cancer in sister, coronary artery disease, and hypertension.   SOCIAL HISTORY: The patient lives at the [place] [facility] [place] with her husband and says she has an apartment. They cook breakfast, gets her meals through the facility. Positive social alcohol history listed. Negative tobacco.  Dot Lanesancy Skeen is the contact person, her daughter.   REVIEW OF SYSTEMS: The patient is unable to give review of systems. Her main complaint is pruritus and she is distraught about pruritus at this time. She has left upper thigh pain.  She answers questions briefly, but states  that she is not having any abdominal pain or tenderness or change in bowel habits. She has noted some black stools, nonspecific.   PHYSICAL EXAMINATION:   VITAL SIGNS: 97.4, 69,  18, 112/64.   GENERAL: Elderly Caucasian female, disheveled.   HEENT: Head is normocephalic. Conjunctivae are pale, pink. Sclerae anicteric. Oral mucosa is dry and intact.   NECK: Supple. Trachea midline.   HEART: Heart tones S1 and S2 are regular. Slight murmur. No gallop.   LUNGS: Essentially clear to auscultation, but the patient is not able to take in an inspiratory breath without vocalizing.  ABDOMEN: Soft, presents nontender at this time without discrete mass.   RECTAL: Deferred.   EXTREMITIES: Lower extremities - wearing Unna boots bilateral, chronic edema.   SKIN: Warm and dry. The patient has pruritus, no rash. IV site looks intact on the left without phlebitis.   PSYCH: Affect and mood is irritable and anxious. The patient is yelling out with pruritus, quite distraught about it, she is able to calm briefly with encouragement and then she starts to become upset about pruritus again. Her speech is clear. Looks at speaker. Not interested in participating with history and physical.    MUSCULOSKELETAL: Joints examined. No swelling, inflammation. Gait not evaluated. Utilizes a chair walker as an outpatient.   LABORATORY: Total bilirubin normal at 0.5. WBC 7.1, hemoglobin 7.2, hematocrit 23.1, platelet count normal 199, normocytic indices, except for [facility] is a little low at 31.2 but MCV is normal at 86. Pro time was 28.7 with INR 2.6, on 02/05/2012.   The patient has been typed and crossed for 2 units and transfusion has been ordered, not yet performed.   IMPRESSION: The patient had reported melena, she presents with acute drop in hemoglobin and elevation in BUN consistent with upper gastrointestinal bleed. She presents now without abdominal complaints or pain or reports of change in bowel habits. Remote  colonoscopy attempted, barium enema 2003 successful to descending colon and notable for diverticulosis. Flexible sigmoidoscopy in 2006 showed prominent veins with possibility of varices.   The patient is on chronic anticoagulation therapy, with chronic Coumadin, and presents with therapeutic PT-INR.   PLAN: Blood transfusion has been ordered and will be started shortly. The patient is to be given IV Protonix. Consider EGD tomorrow when medically feasible.   The case was discussed with Dr. Mechele CollinElliott in collaboration of care.  These services are provided by Cala BradfordKimberly A. Arvilla MarketMills, RN, MS, APRN, [facility], ANP under collaborative agreement with Scot Junobert T. Elliott, MD.  ____________________________ Ranae PlumberKimberly A. Arvilla MarketMills, ANP kam:slb D: 02/06/2012 14:07:29 ET T: 02/06/2012 15:06:31 ET JOB#: [id number]  cc: Cala BradfordKimberly A. Arvilla MarketMills, ANP, <Dictator> Ranae PlumberKimberly A. Suzette BattiestMills RN, MSN, ANP-BC Adult Nurse Practitioner ELECTRONICALLY SIGNED 02/06/2012 16:31

## 2014-12-18 NOTE — H&P (Signed)
PATIENT NAME:  [name], [name] MR#:  [id number] DATE OF BIRTH:  Jan 11, 1923  DATE OF ADMISSION:  10/13/2011  PRIMARY CARE PHYSICIAN:  Dr. Bethann PunchesMark Miller  REFERRING PHYSICIAN: Dr. Dolores FrameSung  CHIEF COMPLAINT: Shortness of breath and cough for two days.   HISTORY OF PRESENT ILLNESS: The patient is an 81 year old Caucasian female with a history of atrial fibrillation, anemia, irritable bowel syndrome, depression and anxiety, hypertension, hyperlipidemia, coronary artery disease, heart failure, deep venous thrombosis,  renal insufficiency, and incontinence who presented to the ED with the above chief complaint. The patient came from a rehab facility. She has had shortness of breath, cough, and  choking for the past two days. She was fine until two days ago. She has not eaten well for the past two days due to aspiration. She chokes even after drinking water. She denies any fever or chills. No chest pain, palpitations, leg edema, orthopnea, or nocturnal dyspnea. The patient has used a walker for many years. She was admitted for fall, contusion, scalp laceration, right toe fracture, [facility]. Due to bleeding her Coumadin was stopped. In addition, she got blood transfusion. Chest x-ray in the ED today showed right-side pneumonia, possibly due to aspiration. She was treated with clindamycin in the ED.   PAST MEDICAL HISTORY:  1. Atrial fibrillation. 2. Chronic renal insufficiency.  3. Depression, anxiety.  4. Anemia. 5. Irritable bowel syndrome. 6. Osteoarthritis.  7. Mitral regurgitation.  8. Hyperlipidemia.  9. Hypertension. 10. Coronary artery disease.  11. Congestive heart failure. 12. Deep vein thrombosis.  13. History of renal stent.   SOCIAL HISTORY: No smoking, alcohol drinking, or illicit drugs.   ALLERGIES: Codeine, penicillin, and Zoloft.   MEDICATIONS:  1. Aspirin 81 mg p.o. daily. 2. Benazepril 10 mg p.o. b.i.d.  3. Bethanechol 10 mg p.o. four times  daily. 4. Biotin 300 mcg p.o. daily.  5. Docusate 200 mg by mouth daily.  6. Hydralazine 50 mg p.o. 3 times daily. 7. Hydrocodone/ acetaminophen 5/325 mg p.o. q. 4 hours p.r.n.  8. Hyoscyamine 0.25 mg p.o. q. 6 hours p.r.n.  9. Levothyroxine 50 mcg p.o. daily.  10. Ativan 0.5 mg q. 8 hours. 11. Lorazepam 0.5 mg q. 8 hours. 12. Lopressor 25 mg p.o. b.i.d.  13. Nitroglycerin 0.4 mg sublingual q. 5 minutes p.r.n.  14. Pravastatin 40 mg p.o. daily.  15. Tramadol 50 mg q. 6 hours p.r.n.  16. Trazodone 50-mg tablet, 75 mg p.o. at bedtime. 17. Vitamin D 1000-unit cap daily.   REVIEW OF SYSTEMS:  CONSTITUTIONAL:  The patient denies any fever or chills. No headache or dizziness but has weight loss recently. CARDIOVASCULAR: No chest pain, palpitations, orthopnea, or nocturnal dyspnea. No leg edema. ENT: No discharge from eyes, nose, or ears. No epistaxis. No hearing loss. PULMONARY: Positive for cough without sputum. Positive for shortness of breath, but no hemoptysis. GI: No abdominal pain, nausea, vomiting, or diarrhea. No melena or bloody stools. GU: No dysuria or hematuria but has urinary incontinence.  SKIN: No rash or jaundice. NEUROLOGIC: No syncope, loss of consciousness, or seizure.  ENDOCRINE: No heat or heat or cold intolerance. HEMATOLOGIC: No bruises or bleeding.   PHYSICAL EXAMINATION:  VITAL SIGNS: Temperature 97.8, blood pressure 175/84, pulse 78, respirations 22, oxygen saturation 100% by nasal cannula.   GENERAL: The patient is alert, awake, oriented, in no acute distress.   HEENT: Pupils are round, equal, and reactive to light and accommodation. Moist oral mucosa. Clear oropharynx.   NECK: Supple.  No JVD or carotid bruit. No lymphadenopathy. No thyromegaly.   CARDIOVASCULAR: S1, S2 regular rate and rhythm. No murmur or gallops.   PULMONARY: Bilateral air entry. Some rhonchi on both sides.   ABDOMEN: Soft. No distention. No tenderness. No organomegaly. Bowel sounds present.    EXTREMITIES: No edema, clubbing, or cyanosis. No calf tenderness. Right foot boot with dressing.   NEUROLOGY: Alert and oriented times 3. No focal deficits. Deep tendon reflexes 2+. Power five out of five. Sensation intact.   LABORATORY, DIAGNOSTIC, AND RADIOLOGICAL DATA: Glucose 100, BUN 21, creatinine 0.85, sodium 125, potassium 4.2, chloride 89, albumin 3, troponin less than 0.02. Urinalysis: WBC 542, RBC 16. WBC 9.8, hemoglobin 9.7, platelets 347 EKG shows atrial fibrillation at 82 beats per minute.   Impressions:  Aspiration PNA UTI Dehydration hyponatremia HTN CAD AFib CHF DVT anemia incontinence Rt big toe fracture.   PLAN OF TREATMENT:   1. The patient will be admitted to the telemetry floor. We will continue telemetry monitor, O2 by nasal cannula.  2. We will use aspiration and fall precautions. 3. We will give clindamycin IVPV, give Robitussin, and nebulizer p.r.n. 4. We will keep her n.p.o. for now until swallowing study.  5. For hyponatremia and dehydration we will give D5 normal saline and follow up BMP.    6. For urinary tract infection we will follow up urine culture and continue clindamycin.  7. Deep vein thrombosis prophylaxis.  8. Discussed the patient's situation and plan of treatment with the patient and the patient's husband.        TIME SPENT: About 65 minutes.  ____________________________ Shaune Pollack, MD qc:bjt D: 10/13/2011 05:15:25 ET T: 10/13/2011 08:17:13 ET JOB#: [id number]  cc: Shaune Pollack, MD, <Dictator> Danella Penton, MD Shaune Pollack MD ELECTRONICALLY SIGNED 10/14/2011 1:42

## 2014-12-18 NOTE — H&P (Signed)
PATIENT NAME:  [name], [name] MR#:  [id number] DATE OF BIRTH:  [birth year]  DATE OF ADMISSION:  02/06/2012  CHIEF COMPLAINT: Gastrointestinal bleed.   HISTORY OF PRESENT ILLNESS: This is an 81 year old with history of atrial fibrillation on Coumadin who presented to the clinic yesterday, on 02/05/2012, with lower extremity edema unresponsive to increasing doses of diuretic. A CBC and MET-B were drawn showing an acute change in her hemoglobin. She was 12.9 in April and now down to 7.3. When the patient was contacted that afternoon, at home, she reported that she had been having black, tarry stools for maybe a week. Her INR was 2.6 yesterday. She does have generalized weakness with mild shortness of breath. She was told to hold her Coumadin and aspirin yesterday and reported this morning to the clinic for admission. She does have a history of renal insufficiency, baseline creatinine is around 1.1, and has been seen by nephrology in [place]. She has had increasing doses of torsemide over the last several days. That has been unable to relieve her edema. She has a GI history of IBS. Her last colonoscopy, per patient, was 2 to 3 years ago and was complicated by inability to visualize the entire colon. Apparently they performed a barium enema for further evaluation.   PAST MEDICAL HISTORY:  1. Atrial fibrillation.  2. Coronary artery disease with myocardial infarction June 2009, post stent x2, bare metal. 3. Hypertension.  4. Hyperlipidemia.  5. Mitral regurgitation.  6. Osteoarthritis.  7. Irritable bowel syndrome.  8. Depression, anxiety.  9. Delirium.  10. Sleep apnea, mild.  11. Serotonin syndrome, October 2008. 12. Renal insufficiency.  13. Anemia.  14. Congestive heart failure.  15. Deep venous thrombosis. 16. History of renal stents.  PAST SURGICAL HISTORY:  1. Hip replacement, 1994. 2. CABG, 1988. 3. Endometriosis laparoscopic surgery, 1954.  4. Hysterectomy, 1962.   FAMILY  HISTORY: Coronary artery disease, breast cancer in a sister, and hypertension.   SOCIAL HISTORY: She lives with her husband at [place]. No tobacco. Occasional alcohol use. Marisue Ivan is the contact person, her daughter.   CURRENT MEDICATIONS:  1. Cymbalta 30 mg daily.  2. Torsemide 10 mg daily.  3. Coumadin 2.5 mg at bedtime, currently holding.  4. Pravachol 40 mg at bedtime.  5. Prilosec 20 mg daily.  6. Synthroid 50 mcg daily.  7. Tramadol 50 mg 1 to 2 tablets twice a day.  8. Benazepril 10 mg twice a day.  9. Ativan 0.5 mg 1/2 to 1 tablet three times daily as needed.  10. Trazodone 50 mg at bedtime p.r.n.  11. Multivitamin daily.  12. Toprol-XL 25 mg twice a day.  13. Aspirin 81 mg daily, currently holding.  14. Align 4 mg one capsule twice a day.  15. Senna Plus 1-1/2 tablets daily.  16. Colace 2 tablets daily.   REVIEW OF SYSTEMS: As per history of present illness. She denies fevers, chills, and cough. She does have some urinary incontinence at times. She has some general abdominal pain, but no acute changes. She does have lower extremity edema and Unna boots were placed yesterday in the office. They were rewrapped by her husband last evening due to pain. Otherwise as per history of present illness.  ALLERGIES: Penicillin, Zoloft, codeine, and amitriptyline.    PHYSICAL EXAMINATION:   GENERAL: No acute distress.   VITAL SIGNS: Weight 139, blood pressure 110/60, temperature 96.8, pulse 78, and oxygen saturation 98% on room air.   HEENT: Head is normocephalic,  atraumatic. Pupils are equal, round and reactive to light. Extraocular muscles are intact. Conjunctivae are pale. Sclerae anicteric, noninjected. Ears show left clear canal with normal landmarks. The right tympanic membrane is occluded by cerumen. Oropharynx is clear, no lesions.   NECK: Supple. No adenopathy. No thyromegaly.   LUNGS: Bibasilar rales with no wheezing.   HEART: Irregular rhythm with rate  in the 70s. No murmurs noted.   ABDOMEN: Mildly distended with general discomfort in the epigastric and left lower quadrant.   SKIN: Warm and dry.   EXTREMITIES: Edema with Unna boots in place.   NEUROLOGIC: Alert and oriented x3.   ASSESSMENT AND PLAN:  1. Acute anemia, GI bleed. Reports  black, tarry stools. Hemoglobin dropped from 12.9 to 7.3 over two months. Admit for transfusion of 2 units. She is symptomatic with edema, shortness of breath, and weakness. Hold Coumadin and aspirin. We will consult gastroenterology. We will check pro time in the a.m.  2. Atrial fibrillation. Holding Coumadin. Continue metoprolol.  3. Hypertension. Given Lasix 20 mg daily. Continue benazepril and Toprol.       4. Congestive heart failure. Lasix 20 mg daily. We will order thigh-high TED stockings. Consider a chest x-ray for any worsening shortness of breath. Consider Foley catheter if unable to get up to the bathroom easily.  ____________________________ Marily Lente. Adysen Raphael, PA-C rjt:slb D: 02/06/2012 09:36:00 ET T: 02/06/2012 10:00:00 ET JOB#: [id number]  cc: Marily Lente. Lolita Lenz, <Dictator> Leonard Downing PA ELECTRONICALLY SIGNED 02/06/2012 17:23

## 2014-12-18 NOTE — Discharge Summary (Signed)
PATIENT NAME:  [name], Mishaal MR#:  [id number] DATE OF BIRTH:  August 01, 1923  DATE OF ADMISSION:  02/06/2012 DATE OF DISCHARGE:  02/12/2012  HISTORY OF PRESENT ILLNESS: [name] is an 81 year old white married lady who had recently been in the office complaining of increasing peripheral edema despite increasing diuretics. Routine laboratory work was drawn and she was found to have a hemoglobin of 7.3. It had been 12.9 in April. The patient was contacted and gave a history of having black, tarry stools for approximately a week. The patient was therefore admitted for GI bleed. The patient was known to have chronic atrial fibrillation and had been on aspirin and Coumadin.   For details of the admission, please see the dictated admission noted.   ADMISSION PHYSICAL EXAMINATION: Examination revealed a weight of 139 pounds, a  blood pressure 110/68, a temperature of 96.8, a pulse of 78, and an oxygen saturation of 98% on room air. The admission examination as described by the admitting physician was most notable for bibasilar rales. The patient was also noted to have an irregular rhythm with a rate in the 70s. No murmur was noted on admission, but she was noted to have a murmur after admission. The abdomen was mildly distended with general discomfort in the epigastrium and left lower quadrant. The patient was noted to have bilateral edema.   RESULTS: Admission CBC showed a hemoglobin of 7.2 with a hematocrit of 23.1. White count was 7100. Platelet count was 199,000. Admission comprehensive metabolic panel showed a random blood sugar of 157. BUN was 71 with a creatinine 1.65. Albumin was 3.3. Estimated GFR was 28.   HOSPITAL COURSE: The patient was admitted to the regular medical floor where she was rehydrated with IV fluids for her over diuresis. The patient's aspirin and Coumadin were discontinued. Her blood pressure medications were also initially held because she was somewhat hypotensive and dehydrated.  She was seen in consultation during her hospitalization by her cardiologist. She was also seen in consultation by gastroenterology and eventually underwent an upper endoscopy on 02/10/2012 which revealed marked gastritis but no active bleeding. During the patient's hospitalization, she was transfused twice. Her hemoglobin eventually stabilized and at the time of discharge was 9.6.    DISCHARGE DIAGNOSES:  1. Acute upper gastrointestinal bleed.  2. Acute blood loss secondary to #1.  3. Chronic atrial fibrillation.  4. Acute renal failure secondary to dehydration.  5. History of coronary artery disease.  6. Hypertension.  7. Chronic anxiety and depression.  8. History of chronic anemia.   DISCHARGE DISPOSITION: The patient was discharged on a low sodium diet with activity as tolerated. Her preadmission medications were continued without change with the exception of aspirin and Coumadin, which are being discontinued. The patient was discharged on Protonix 40 mg twice a day until she is seen in follow-up.   The patient is being discharged with home health. She is a FULL CODE. She is to follow-up with Dr. Hyacinth MeekerMiller in approximately one week.  ____________________________ Letta PateJohn B. Danne HarborWalker III, MD jbw:slb D: 02/12/2012 07:48:21 ET T: 02/12/2012 11:17:06 ET JOB#: [id number]  cc: Jonny RuizJohn B. Danne HarborWalker III, MD, <Dictator> Elmo PuttJOHN B WALKER III MD ELECTRONICALLY SIGNED 02/16/2012 17:32
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
## 2014-12-18 NOTE — Consult Note (Signed)
Brief Consult Note: Diagnosis: Acute anemia with reports of melena, patient denies abdominal complaints, on chronic anticoagulation, rise in BUN. Etiology to consider upper gi bleed.   Patient was seen by consultant.   Comments: Begin Protonix IV bid. Transfusion ordered, repeat labs in am and if medically stable will consider EGD tomorrow. Case discussed with Dr. Mechele CollinElliott in collaboration of care.  Electronic Signatures: Rowan BlaseMills, Kase Shughart Ann (NP)  (Signed 13-Jun-13 12:49)  Authored: Brief Consult Note   Last Updated: 13-Jun-13 12:49 by Rowan BlaseMills, Yoshie Kosel Ann (NP)

## 2014-12-18 NOTE — Discharge Summary (Signed)
PATIENT NAME:  [name], [name] MR#:  [id number] DATE OF BIRTH:  Oct 21, 1922  DATE OF ADMISSION:  10/13/2011 DATE OF DISCHARGE:  10/15/2011  ADDENDUM   TIME SPENT: 40 minutes were required to do the discharge work.   ____________________________ Danella PentonMark F. Cavin Longman, MD mfm:ap D: 10/15/2011 07:48:16 ET T: 10/15/2011 08:26:06 ET JOB#: [id number]  cc: Danella PentonMark F. Sulay Brymer, MD, <Dictator> Noami Bove Sherlene ShamsF Bryanne Riquelme MD ELECTRONICALLY SIGNED 10/16/2011 8:06

## 2014-12-18 NOTE — Consult Note (Signed)
General Aspect [name] is a delightful 81 year old white female  with multiple medical problems including coronary artery disease status post bypass surgery in 1998 with several subsequent coronary interventions, angioplasty and stenting of the RCA with 2 bare-metal stents in June 2009, history of chronic atrial fibrillation, diastolic heart failure, anemia, renal insufficiency, and renal artery stenosis status post right renal artery stenting.she presents for routine followup.  History of dementia. She has renal artery stenosis that has been progressive with some increasing atrophy of kidney.   She has rare falls, one late last year with laceration to her head, additional fall several weeks ago with trauma to her right thigh . Warfarin was held for several months earlier this year after recent fall and was restarted for atrial fibrillation .  she presented with increased lower extremity edema which was unresponsive to increased dose of diuretics. She had routine labs performed which showed significant drop in her hemoglobin to 7.2 from a baseline of around 12. She had dark tarry stools for a few days. She received 2 units of blood transfusion. She is feeling better. Her edema improved. Her blood pressure is running in the low side. She denies chest pain or dyspnea.   Physical Exam:   GEN no acute distress, thin    NECK supple  + JVD    RESP normal resp effort  clear BS    CARD Irregular rate and rhythm  Murmur    Murmur Systolic    ABD positive tenderness    EXTR positive edema, + 1 edema    PSYCH alert, A+O to time, place, person   Review of Systems:   Subjective/Chief Complaint edema, dark tarry stool    General: Fatigue    Skin: No Complaints    ENT: No Complaints    Eyes: No Complaints    Neck: No Complaints    Respiratory: Short of breath    Cardiovascular: Dyspnea  Edema    Gastrointestinal: Black tarry stools    Vascular: No Complaints    Musculoskeletal:  No Complaints    Neurologic: Dizzness    Hematologic: No Complaints    Endocrine: No Complaints    Psychiatric: No Complaints    Review of Systems: All other systems were reviewed and found to be negative   Home Medications: Medication Instructions Status  benazepril tablet 10 mg 1 tab(s) orally 2 times a day  Active  vitamin D 1000 unit(s) orally once a day Active  Pravachol tablet 40 mg 1 tab(s) orally once a day (at bedtime)  Active  Milk of Magnesia  orally  Active  Percocet 5/325 oral tablet tab(s) orally  Active  biotin 300 mcg oral tablet tab(s) orally  Active  metoprolol tartrate 25 mg oral tablet tab(s) orally 2 times a day Active  levothyroxine 50 mcg (0.05 mg) oral capsule cap(s) orally once a day Active  Advair Diskus 100 mcg-50 mcg inhalation powder puff(s) inhaled once a day Active  Tylenol 8 Hour Caplet 650 milligram(s) orally every 4 hours, As Needed- for Pain  Active  Protonix 40 mg oral granule, enteric coated each orally 2 times a day Active  Cymbalta 30 mg oral delayed release capsule 1 cap(s) orally 2 times a day Active  Prilosec 20 mg oral delayed release capsule 1 cap(s) orally once a day Active  RisaQuad oral capsule 1 cap(s) orally once a day Active  Centrum Silver oral tablet 1 tab(s) orally once a day Active  lorazepam 0.5 mg oral tablet .  13m at breakfast and lunch and 1 whole .5 mg at bedtime Active  trazodone 50 milligram(s) orally 3 times a day Active  Align 4 mg oral capsule 2 x daily Active   Lab Results: Routine Chem:  15-Jun-13 06:32    Glucose, Serum 91   BUN  50   Creatinine (comp)  1.41   Sodium, Serum  133   Potassium, Serum 3.8   Chloride, Serum  97   CO2, Serum 27   Calcium (Total), Serum 8.5   Anion Gap 9   Osmolality (calc) 279   eGFR (African American)  39   eGFR (Non-African American)  34 (eGFR values <638mmin/1.73 m2 may be an indication of chronic kidney disease (CKD). Calculated eGFR is useful in patients with stable  renal function. The eGFR calculation will not be reliable in acutely ill patients when serum creatinine is changing rapidly. It is not useful in  patients on dialysis. The eGFR calculation may not be applicable to patients at the low and high extremes of body sizes, pregnant women, and vegetarians.)  Routine Coag:  15-Jun-13 06:32    Prothrombin  21.2   INR 1.8 (INR reference interval applies to patients on anticoagulant therapy. A single INR therapeutic range for coumarins is not optimal for all indications; however, the suggested range for most indications is 2.0 - 3.0. Exceptions to the INR Reference Range may include: Prosthetic heart valves, acute myocardial infarction, prevention of myocardial infarction, and combinations of aspirin and anticoagulant. The need for a higher or lower target INR must be assessed individually. Reference: The Pharmacology and Management of the Vitamin K  antagonists: the seventh ACCP Conference on Antithrombotic and Thrombolytic Therapy. [id number].[id number]:126 (3suppl): [id number] HCT value >55% may artifactually increase the PT.  In one study,  the increase was an average of 25%. Reference:  "Effect on Routine and Special Coagulation Testing Values of Citrate Anticoagulant Adjustment in Patients with High HCT Values." American Journal of Clinical Pathology 2006;126:400-405.)  Routine Hem:  15-Jun-13 06:32    WBC (CBC) 6.0   RBC (CBC)  3.24   Hemoglobin (CBC)  9.2   Hematocrit (CBC)  27.9   Platelet Count (CBC) 159   MCV 86   MCH 28.3   MCHC 33.0   RDW  16.6   Neutrophil % 48.1   Lymphocyte % 30.1   Monocyte % 12.7   Eosinophil % 8.2   Basophil % 0.9   Neutrophil # 2.9   Lymphocyte # 1.8   Monocyte # 0.8   Eosinophil # 0.5   Basophil # 0.1 (Result(s) reported on 08 Feb 2012 at 08:36AM.)   EKG:   EKG atrial fibrillation    Penicillin: Rash  Zoloft: Other  Codeine: Unknown  Vital Signs/Nurse's Notes: **Vital Signs.:   15-Jun-13  04:49   Vital Signs Type Routine   Temperature Temperature (F) 98   Celsius 36.6   Temperature Source Oral   Pulse Pulse 63   Respirations Respirations 20   Systolic BP Systolic BP 89   Diastolic BP (mmHg) Diastolic BP (mmHg) 54   Mean BP 65   Pulse Ox % Pulse Ox % 95   Pulse Ox Activity Level  At rest   Oxygen Delivery Room Air/ 21 %    0550:53 Systolic BP Systolic BP 96   Diastolic BP (mmHg) Diastolic BP (mmHg) 60   Mean BP 72   BP Source manual    0897:67 Systolic BP Systolic BP 96  Diastolic BP (mmHg) Diastolic BP (mmHg) 55   Mean BP 68   BP Source manual     Impression 1. Acute blood loss anemia: Likely due to upper GI bleed. 2. Acute on chronic diastolic heart failure. 3. Mild hypotension. 4. Chronic atrial fibrillation currently with controlled rate. 5. Coronary artery disease: Seems to be stable.    Plan I agree with blood transfusion to keep hemoglobin above 8. Warfarin is on hold at this point and that a definite diagnosis is made.she is likely going for an EGD on Monday. Given her recurrent falls and the current episode of GI bleeding, the risks of anticoagulation might outweigh the benefit. I agree with holding her blood pressure medications for now due to her hypotension which is likely due to anemia. She had worsening of heart failure recently likely due to myocardial stress from worsening anemia. She has not had any recent echocardiogram and thus I will obtain one. She appears to be slightly fluid overloaded at this time.   Electronic Signatures: Kathlyn Sacramento (MD)  (Signed 15-Jun-13 12:55)  Authored: General Aspect/Present Illness, History and Physical Exam, Review of System, Home Medications, Labs, EKG , Allergies, Vital Signs/Nurse's Notes, Impression/Plan   Last Updated: 15-Jun-13 12:55 by Kathlyn Sacramento (MD)
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
## 2014-12-18 NOTE — Consult Note (Signed)
Pt had a good night per her stomach, legs still hurt. Hgb stable after 2 units, staying around 9.2, INR down to 1.8, BUN 50 50, was 70 two days ago.  Abd non tender, No HSM.  Will likely do EGD Monday after INR comes down.  Pt husband says she is followed by [facility] and asks if they can be notified of her admission here since she had an appt next week.  Will contact them.  Electronic Signatures: Scot JunElliott, Clair Alfieri T (MD)  (Signed on 15-Jun-13 09:08)  Authored  Last Updated: 15-Jun-13 09:08 by Scot JunElliott, Bera Pinela T (MD)

## 2014-12-18 NOTE — Consult Note (Signed)
EGD showed only a spot of gastritis, small hiatal hernia, no blood, no ulcer.  Will start diet back.  coumadin may not be a good idea to restart.  leave this to her primary doctor.  Electronic Signatures: Scot JunElliott, Garrick Midgley T (MD)  (Signed on 17-Jun-13 14:09)  Authored  Last Updated: 17-Jun-13 14:09 by Scot JunElliott, Fontella Shan T (MD)

## 2015-01-25 ENCOUNTER — Encounter
Admission: RE | Admit: 2015-01-25 | Discharge: 2015-01-25 | Disposition: A | Discharge status: Home / Self Care | Payer: Medicare Other | Source: Ambulatory Visit | Attending: Internal Medicine | Admitting: Internal Medicine

## 2015-02-16 ENCOUNTER — Emergency Department: Payer: Medicare Other

## 2015-02-16 ENCOUNTER — Inpatient Hospital Stay
Admission: EM | Admit: 2015-02-16 | Discharge: 2015-02-22 | DRG: 871 | Disposition: A | Discharge status: Skilled Nursing Facility | Payer: Medicare Other | Attending: Internal Medicine | Admitting: Internal Medicine

## 2015-02-16 ENCOUNTER — Encounter: Payer: Self-pay | Admitting: Emergency Medicine

## 2015-02-16 DIAGNOSIS — Z96641 Presence of right artificial hip joint: Secondary | ICD-10-CM | POA: Diagnosis present

## 2015-02-16 DIAGNOSIS — I701 Atherosclerosis of renal artery: Secondary | ICD-10-CM | POA: Diagnosis present

## 2015-02-16 DIAGNOSIS — Z87891 Personal history of nicotine dependence: Secondary | ICD-10-CM

## 2015-02-16 DIAGNOSIS — A419 Sepsis, unspecified organism: Secondary | ICD-10-CM | POA: Diagnosis present

## 2015-02-16 DIAGNOSIS — I272 Other secondary pulmonary hypertension: Secondary | ICD-10-CM | POA: Diagnosis present

## 2015-02-16 DIAGNOSIS — I2510 Atherosclerotic heart disease of native coronary artery without angina pectoris: Secondary | ICD-10-CM | POA: Diagnosis present

## 2015-02-16 DIAGNOSIS — I071 Rheumatic tricuspid insufficiency: Secondary | ICD-10-CM | POA: Diagnosis present

## 2015-02-16 DIAGNOSIS — Z9889 Other specified postprocedural states: Secondary | ICD-10-CM

## 2015-02-16 DIAGNOSIS — N183 Chronic kidney disease, stage 3 unspecified: Secondary | ICD-10-CM

## 2015-02-16 DIAGNOSIS — R188 Other ascites: Secondary | ICD-10-CM | POA: Diagnosis present

## 2015-02-16 DIAGNOSIS — J90 Pleural effusion, not elsewhere classified: Secondary | ICD-10-CM | POA: Diagnosis present

## 2015-02-16 DIAGNOSIS — I959 Hypotension, unspecified: Secondary | ICD-10-CM

## 2015-02-16 DIAGNOSIS — D649 Anemia, unspecified: Secondary | ICD-10-CM | POA: Diagnosis present

## 2015-02-16 DIAGNOSIS — Z880 Allergy status to penicillin: Secondary | ICD-10-CM

## 2015-02-16 DIAGNOSIS — A4181 Sepsis due to Enterococcus: Principal | ICD-10-CM

## 2015-02-16 DIAGNOSIS — Z79891 Long term (current) use of opiate analgesic: Secondary | ICD-10-CM

## 2015-02-16 DIAGNOSIS — Z955 Presence of coronary angioplasty implant and graft: Secondary | ICD-10-CM

## 2015-02-16 DIAGNOSIS — E785 Hyperlipidemia, unspecified: Secondary | ICD-10-CM | POA: Diagnosis present

## 2015-02-16 DIAGNOSIS — I129 Hypertensive chronic kidney disease with stage 1 through stage 4 chronic kidney disease, or unspecified chronic kidney disease: Secondary | ICD-10-CM | POA: Diagnosis present

## 2015-02-16 DIAGNOSIS — Z951 Presence of aortocoronary bypass graft: Secondary | ICD-10-CM

## 2015-02-16 DIAGNOSIS — Z66 Do not resuscitate: Secondary | ICD-10-CM | POA: Diagnosis present

## 2015-02-16 DIAGNOSIS — R1084 Generalized abdominal pain: Secondary | ICD-10-CM

## 2015-02-16 DIAGNOSIS — F0390 Unspecified dementia without behavioral disturbance: Secondary | ICD-10-CM | POA: Diagnosis present

## 2015-02-16 DIAGNOSIS — N12 Tubulo-interstitial nephritis, not specified as acute or chronic: Secondary | ICD-10-CM | POA: Diagnosis present

## 2015-02-16 DIAGNOSIS — Z79899 Other long term (current) drug therapy: Secondary | ICD-10-CM

## 2015-02-16 DIAGNOSIS — K5790 Diverticulosis of intestine, part unspecified, without perforation or abscess without bleeding: Secondary | ICD-10-CM | POA: Diagnosis present

## 2015-02-16 DIAGNOSIS — Z7982 Long term (current) use of aspirin: Secondary | ICD-10-CM

## 2015-02-16 DIAGNOSIS — Z885 Allergy status to narcotic agent status: Secondary | ICD-10-CM

## 2015-02-16 DIAGNOSIS — I6529 Occlusion and stenosis of unspecified carotid artery: Secondary | ICD-10-CM | POA: Diagnosis present

## 2015-02-16 DIAGNOSIS — Z7901 Long term (current) use of anticoagulants: Secondary | ICD-10-CM

## 2015-02-16 DIAGNOSIS — I5043 Acute on chronic combined systolic (congestive) and diastolic (congestive) heart failure: Secondary | ICD-10-CM | POA: Diagnosis present

## 2015-02-16 DIAGNOSIS — E860 Dehydration: Secondary | ICD-10-CM | POA: Diagnosis present

## 2015-02-16 DIAGNOSIS — N390 Urinary tract infection, site not specified: Secondary | ICD-10-CM

## 2015-02-16 DIAGNOSIS — K589 Irritable bowel syndrome without diarrhea: Secondary | ICD-10-CM | POA: Diagnosis present

## 2015-02-16 DIAGNOSIS — I340 Nonrheumatic mitral (valve) insufficiency: Secondary | ICD-10-CM | POA: Diagnosis present

## 2015-02-16 DIAGNOSIS — I5033 Acute on chronic diastolic (congestive) heart failure: Secondary | ICD-10-CM

## 2015-02-16 DIAGNOSIS — I5021 Acute systolic (congestive) heart failure: Secondary | ICD-10-CM

## 2015-02-16 DIAGNOSIS — J9601 Acute respiratory failure with hypoxia: Secondary | ICD-10-CM

## 2015-02-16 DIAGNOSIS — I482 Chronic atrial fibrillation: Secondary | ICD-10-CM | POA: Diagnosis present

## 2015-02-16 HISTORY — DX: Heart failure, unspecified: I50.9

## 2015-02-16 LAB — CBC WITH DIFFERENTIAL/PLATELET
Basophils Absolute: 0.1 10*3/uL (ref 0–0.1)
Basophils Relative: 1 %
EOS PCT: 5 %
Eosinophils Absolute: 0.6 10*3/uL (ref 0–0.7)
HEMATOCRIT: 30.7 % — AB (ref 35.0–47.0)
Hemoglobin: 9.8 g/dL — ABNORMAL LOW (ref 12.0–16.0)
LYMPHS PCT: 15 %
Lymphs Abs: 1.8 10*3/uL (ref 1.0–3.6)
MCH: 30.8 pg (ref 26.0–34.0)
MCHC: 31.8 g/dL — AB (ref 32.0–36.0)
MCV: 96.7 fL (ref 80.0–100.0)
Monocytes Absolute: 1 10*3/uL — ABNORMAL HIGH (ref 0.2–0.9)
Monocytes Relative: 9 %
Neutro Abs: 8.1 10*3/uL — ABNORMAL HIGH (ref 1.4–6.5)
Neutrophils Relative %: 70 %
Platelets: 142 10*3/uL — ABNORMAL LOW (ref 150–440)
RBC: 3.17 MIL/uL — ABNORMAL LOW (ref 3.80–5.20)
RDW: 16.3 % — AB (ref 11.5–14.5)
WBC: 11.7 10*3/uL — ABNORMAL HIGH (ref 3.6–11.0)

## 2015-02-16 LAB — COMPREHENSIVE METABOLIC PANEL
ALT: 11 U/L — ABNORMAL LOW (ref 14–54)
ANION GAP: 11 (ref 5–15)
AST: 26 U/L (ref 15–41)
Albumin: 3.5 g/dL (ref 3.5–5.0)
Alkaline Phosphatase: 195 U/L — ABNORMAL HIGH (ref 38–126)
BUN: 35 mg/dL — AB (ref 6–20)
CO2: 29 mmol/L (ref 22–32)
CREATININE: 1 mg/dL (ref 0.44–1.00)
Calcium: 8.5 mg/dL — ABNORMAL LOW (ref 8.9–10.3)
Chloride: 94 mmol/L — ABNORMAL LOW (ref 101–111)
GFR calc Af Amer: 56 mL/min — ABNORMAL LOW (ref >60)
GFR calc non Af Amer: 48 mL/min — ABNORMAL LOW (ref >60)
Glucose, Bld: 141 mg/dL — ABNORMAL HIGH (ref 65–99)
POTASSIUM: 4.6 mmol/L (ref 3.5–5.1)
Sodium: 134 mmol/L — ABNORMAL LOW (ref 135–145)
Total Bilirubin: 1.1 mg/dL (ref 0.3–1.2)
Total Protein: 7.3 g/dL (ref 6.5–8.1)

## 2015-02-16 LAB — URINALYSIS COMPLETE WITH MICROSCOPIC (ARMC ONLY)
Bilirubin Urine: NEGATIVE
Glucose, UA: NEGATIVE mg/dL
Ketones, ur: NEGATIVE mg/dL
NITRITE: NEGATIVE
Protein, ur: 30 mg/dL — AB
SPECIFIC GRAVITY, URINE: 1.011 (ref 1.005–1.030)
pH: 5 (ref 5.0–8.0)

## 2015-02-16 LAB — LACTIC ACID, PLASMA: Lactic Acid, Venous: 1.8 mmol/L (ref 0.5–2.0)

## 2015-02-16 LAB — TROPONIN I: Troponin I: 0.03 ng/mL (ref <0.031)

## 2015-02-16 MED ORDER — SODIUM CHLORIDE 0.9 % IV BOLUS (SEPSIS): 1000.0000 mL | Freq: Once | INTRAVENOUS | Status: DC

## 2015-02-16 MED ORDER — ACETAMINOPHEN 650 MG RE SUPP
RECTAL | Status: AC
  Administered 2015-02-16: 650 mg via RECTAL
  Filled 2015-02-16: qty 1

## 2015-02-16 MED ORDER — LEVOFLOXACIN IN D5W 750 MG/150ML IV SOLN
INTRAVENOUS | Status: AC
  Administered 2015-02-16: 750 mg via INTRAVENOUS
  Filled 2015-02-16: qty 150

## 2015-02-16 MED ORDER — VANCOMYCIN HCL IN DEXTROSE 1-5 GM/200ML-% IV SOLN
1000.0000 mg | Freq: Once | INTRAVENOUS | Status: AC
  Administered 2015-02-16: 1000 mg via INTRAVENOUS

## 2015-02-16 MED ORDER — VANCOMYCIN HCL IN DEXTROSE 1-5 GM/200ML-% IV SOLN
INTRAVENOUS | Status: AC
  Administered 2015-02-16: 1000 mg via INTRAVENOUS
  Filled 2015-02-16: qty 200

## 2015-02-16 MED ORDER — DEXTROSE 5 % IV SOLN
2.0000 g | Freq: Once | INTRAVENOUS | Status: AC
  Administered 2015-02-17: 2 g via INTRAVENOUS
  Filled 2015-02-16: qty 2

## 2015-02-16 MED ORDER — LEVOFLOXACIN IN D5W 750 MG/150ML IV SOLN
750.0000 mg | Freq: Once | INTRAVENOUS | Status: AC
  Administered 2015-02-16: 750 mg via INTRAVENOUS

## 2015-02-16 MED ORDER — SODIUM CHLORIDE 0.9 % IV BOLUS (SEPSIS): 500.0000 mL | Freq: Once | INTRAVENOUS | Status: DC

## 2015-02-16 MED ORDER — ACETAMINOPHEN 650 MG RE SUPP
650.0000 mg | Freq: Once | RECTAL | Status: AC
  Administered 2015-02-16: 650 mg via RECTAL

## 2015-02-16 NOTE — ED Notes (Signed)
Pt with CT. 
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
## 2015-02-16 NOTE — ED Notes (Signed)
Pt arrived via EMS from [facility] with difficulties breathing. Pt arrived with Non- rebreather. EMS stated pt has oral temp of 103 and O2 sats in the 80's. Pt is hot to touch. Alert and oriented upon arrival.

## 2015-02-17 DIAGNOSIS — K5790 Diverticulosis of intestine, part unspecified, without perforation or abscess without bleeding: Secondary | ICD-10-CM | POA: Diagnosis present

## 2015-02-17 DIAGNOSIS — Z79899 Other long term (current) drug therapy: Secondary | ICD-10-CM | POA: Diagnosis not present

## 2015-02-17 DIAGNOSIS — J9601 Acute respiratory failure with hypoxia: Secondary | ICD-10-CM | POA: Diagnosis present

## 2015-02-17 DIAGNOSIS — Z955 Presence of coronary angioplasty implant and graft: Secondary | ICD-10-CM | POA: Diagnosis not present

## 2015-02-17 DIAGNOSIS — K589 Irritable bowel syndrome without diarrhea: Secondary | ICD-10-CM | POA: Diagnosis present

## 2015-02-17 DIAGNOSIS — I340 Nonrheumatic mitral (valve) insufficiency: Secondary | ICD-10-CM | POA: Diagnosis not present

## 2015-02-17 DIAGNOSIS — Z951 Presence of aortocoronary bypass graft: Secondary | ICD-10-CM | POA: Diagnosis not present

## 2015-02-17 DIAGNOSIS — Z87891 Personal history of nicotine dependence: Secondary | ICD-10-CM | POA: Diagnosis not present

## 2015-02-17 DIAGNOSIS — I129 Hypertensive chronic kidney disease with stage 1 through stage 4 chronic kidney disease, or unspecified chronic kidney disease: Secondary | ICD-10-CM | POA: Diagnosis present

## 2015-02-17 DIAGNOSIS — A4181 Sepsis due to Enterococcus: Secondary | ICD-10-CM | POA: Diagnosis present

## 2015-02-17 DIAGNOSIS — I272 Other secondary pulmonary hypertension: Secondary | ICD-10-CM | POA: Diagnosis present

## 2015-02-17 DIAGNOSIS — J90 Pleural effusion, not elsewhere classified: Secondary | ICD-10-CM | POA: Diagnosis present

## 2015-02-17 DIAGNOSIS — N183 Chronic kidney disease, stage 3 (moderate): Secondary | ICD-10-CM | POA: Diagnosis present

## 2015-02-17 DIAGNOSIS — I2510 Atherosclerotic heart disease of native coronary artery without angina pectoris: Secondary | ICD-10-CM | POA: Diagnosis present

## 2015-02-17 DIAGNOSIS — E860 Dehydration: Secondary | ICD-10-CM | POA: Diagnosis present

## 2015-02-17 DIAGNOSIS — Z66 Do not resuscitate: Secondary | ICD-10-CM | POA: Diagnosis present

## 2015-02-17 DIAGNOSIS — A419 Sepsis, unspecified organism: Secondary | ICD-10-CM | POA: Diagnosis present

## 2015-02-17 DIAGNOSIS — E785 Hyperlipidemia, unspecified: Secondary | ICD-10-CM | POA: Diagnosis present

## 2015-02-17 DIAGNOSIS — I5043 Acute on chronic combined systolic (congestive) and diastolic (congestive) heart failure: Secondary | ICD-10-CM | POA: Diagnosis present

## 2015-02-17 DIAGNOSIS — Z885 Allergy status to narcotic agent status: Secondary | ICD-10-CM | POA: Diagnosis not present

## 2015-02-17 DIAGNOSIS — I6529 Occlusion and stenosis of unspecified carotid artery: Secondary | ICD-10-CM | POA: Diagnosis present

## 2015-02-17 DIAGNOSIS — Z7901 Long term (current) use of anticoagulants: Secondary | ICD-10-CM | POA: Diagnosis not present

## 2015-02-17 DIAGNOSIS — F0390 Unspecified dementia without behavioral disturbance: Secondary | ICD-10-CM | POA: Diagnosis present

## 2015-02-17 DIAGNOSIS — Z7982 Long term (current) use of aspirin: Secondary | ICD-10-CM | POA: Diagnosis not present

## 2015-02-17 DIAGNOSIS — Z79891 Long term (current) use of opiate analgesic: Secondary | ICD-10-CM | POA: Diagnosis not present

## 2015-02-17 DIAGNOSIS — Z96641 Presence of right artificial hip joint: Secondary | ICD-10-CM | POA: Diagnosis present

## 2015-02-17 DIAGNOSIS — Z880 Allergy status to penicillin: Secondary | ICD-10-CM | POA: Diagnosis not present

## 2015-02-17 DIAGNOSIS — R188 Other ascites: Secondary | ICD-10-CM | POA: Diagnosis present

## 2015-02-17 DIAGNOSIS — D649 Anemia, unspecified: Secondary | ICD-10-CM | POA: Diagnosis present

## 2015-02-17 DIAGNOSIS — I701 Atherosclerosis of renal artery: Secondary | ICD-10-CM | POA: Diagnosis present

## 2015-02-17 DIAGNOSIS — I482 Chronic atrial fibrillation: Secondary | ICD-10-CM | POA: Diagnosis present

## 2015-02-17 DIAGNOSIS — I071 Rheumatic tricuspid insufficiency: Secondary | ICD-10-CM | POA: Diagnosis present

## 2015-02-17 DIAGNOSIS — N12 Tubulo-interstitial nephritis, not specified as acute or chronic: Secondary | ICD-10-CM | POA: Diagnosis present

## 2015-02-17 LAB — GLUCOSE, CAPILLARY: Glucose-Capillary: 106 mg/dL — ABNORMAL HIGH (ref 65–99)

## 2015-02-17 LAB — MRSA PCR SCREENING: MRSA by PCR: NEGATIVE

## 2015-02-17 LAB — APTT: APTT: 48 s — AB (ref 24–36)

## 2015-02-17 LAB — LACTIC ACID, PLASMA: Lactic Acid, Venous: 1.6 mmol/L (ref 0.5–2.0)

## 2015-02-17 LAB — TSH: TSH: 27.206 u[IU]/mL — ABNORMAL HIGH (ref 0.350–4.500)

## 2015-02-17 LAB — PROTIME-INR
INR: 1.29
Prothrombin Time: 16.3 seconds — ABNORMAL HIGH (ref 11.4–15.0)

## 2015-02-17 MED ORDER — ONDANSETRON HCL 4 MG/2ML IJ SOLN
4.0000 mg | Freq: Four times a day (QID) | INTRAMUSCULAR | Status: DC | PRN
  Administered 2015-02-18 – 2015-02-20 (×3): 4 mg via INTRAVENOUS
  Filled 2015-02-17 (×3): qty 2

## 2015-02-17 MED ORDER — NITROGLYCERIN 0.4 MG SL SUBL: 0.4000 mg | SUBLINGUAL_TABLET | SUBLINGUAL | Status: DC | PRN

## 2015-02-17 MED ORDER — BENAZEPRIL HCL 10 MG PO TABS
10.0000 mg | ORAL_TABLET | Freq: Two times a day (BID) | ORAL | Status: DC
Start: 2015-02-17 — End: 2015-02-18
  Administered 2015-02-17 (×2): 10 mg via ORAL
  Filled 2015-02-17 (×3): qty 1

## 2015-02-17 MED ORDER — WARFARIN SODIUM 5 MG PO TABS
2.5000 mg | ORAL_TABLET | Freq: Every day | ORAL | Status: DC
  Administered 2015-02-18: 2.5 mg via ORAL
  Filled 2015-02-17: qty 1

## 2015-02-17 MED ORDER — SODIUM CHLORIDE 0.9 % IV BOLUS (SEPSIS)
1000.0000 mL | Freq: Once | INTRAVENOUS | Status: AC
  Administered 2015-02-17: 1000 mL via INTRAVENOUS

## 2015-02-17 MED ORDER — DOCUSATE SODIUM 100 MG PO CAPS
200.0000 mg | ORAL_CAPSULE | Freq: Every day | ORAL | Status: DC
  Administered 2015-02-17: 200 mg via ORAL
  Administered 2015-02-18: 09:00:00 100 mg via ORAL
  Administered 2015-02-19 – 2015-02-22 (×3): 200 mg via ORAL
  Filled 2015-02-17 (×5): qty 2

## 2015-02-17 MED ORDER — WARFARIN - PHYSICIAN DOSING INPATIENT: Freq: Every day | Status: DC

## 2015-02-17 MED ORDER — ASPIRIN EC 81 MG PO TBEC
81.0000 mg | DELAYED_RELEASE_TABLET | Freq: Every day | ORAL | Status: DC
  Administered 2015-02-17 – 2015-02-22 (×5): 81 mg via ORAL
  Filled 2015-02-17 (×5): qty 1

## 2015-02-17 MED ORDER — TRAZODONE HCL 50 MG PO TABS
50.0000 mg | ORAL_TABLET | Freq: Every day | ORAL | Status: DC
  Administered 2015-02-17 – 2015-02-21 (×5): 50 mg via ORAL
  Filled 2015-02-17 (×5): qty 1

## 2015-02-17 MED ORDER — FLORA-Q PO CAPS: 1.0000 | ORAL_CAPSULE | Freq: Every day | ORAL | Status: DC

## 2015-02-17 MED ORDER — PRAVASTATIN SODIUM 20 MG PO TABS
40.0000 mg | ORAL_TABLET | Freq: Every day | ORAL | Status: DC
  Administered 2015-02-17 – 2015-02-22 (×5): 40 mg via ORAL
  Filled 2015-02-17 (×5): qty 2

## 2015-02-17 MED ORDER — SODIUM CHLORIDE 0.9 % IV BOLUS (SEPSIS)
500.0000 mL | Freq: Once | INTRAVENOUS | Status: AC
  Administered 2015-02-17: 500 mL via INTRAVENOUS

## 2015-02-17 MED ORDER — VANCOMYCIN HCL IN DEXTROSE 750-5 MG/150ML-% IV SOLN
750.0000 mg | INTRAVENOUS | Status: DC
  Administered 2015-02-17 – 2015-02-19 (×4): 750 mg via INTRAVENOUS
  Filled 2015-02-17 (×9): qty 150

## 2015-02-17 MED ORDER — LORAZEPAM 0.5 MG PO TABS
0.5000 mg | ORAL_TABLET | Freq: Every evening | ORAL | Status: DC
  Administered 2015-02-17 – 2015-02-19 (×3): 0.5 mg via ORAL
  Filled 2015-02-17 (×3): qty 1

## 2015-02-17 MED ORDER — SODIUM CHLORIDE 0.9 % IV SOLN
INTRAVENOUS | Status: DC
  Administered 2015-02-17 – 2015-02-18 (×2): via INTRAVENOUS

## 2015-02-17 MED ORDER — LEVOFLOXACIN IN D5W 750 MG/150ML IV SOLN
750.0000 mg | INTRAVENOUS | Status: DC
  Administered 2015-02-18: 17:00:00 750 mg via INTRAVENOUS
  Filled 2015-02-17: qty 150

## 2015-02-17 MED ORDER — WARFARIN SODIUM 2.5 MG PO TABS
2.5000 mg | ORAL_TABLET | Freq: Every day | ORAL | Status: DC
  Administered 2015-02-17: 2.5 mg via ORAL
  Filled 2015-02-17: qty 1

## 2015-02-17 MED ORDER — DULOXETINE HCL 30 MG PO CPEP
30.0000 mg | ORAL_CAPSULE | Freq: Every day | ORAL | Status: DC
  Administered 2015-02-17 – 2015-02-22 (×5): 30 mg via ORAL
  Filled 2015-02-17 (×5): qty 1

## 2015-02-17 MED ORDER — TORSEMIDE 5 MG PO TABS
10.0000 mg | ORAL_TABLET | ORAL | Status: DC
  Administered 2015-02-17 – 2015-02-21 (×3): 10 mg via ORAL
  Filled 2015-02-17 (×2): qty 2
  Filled 2015-02-17: qty 0.5

## 2015-02-17 MED ORDER — LORAZEPAM 0.5 MG PO TABS: 0.5000 mg | ORAL_TABLET | Freq: Three times a day (TID) | ORAL | Status: DC

## 2015-02-17 MED ORDER — HEPARIN SODIUM (PORCINE) 5000 UNIT/ML IJ SOLN
5000.0000 [IU] | Freq: Three times a day (TID) | INTRAMUSCULAR | Status: DC
  Administered 2015-02-17 – 2015-02-18 (×3): 5000 [IU] via SUBCUTANEOUS
  Filled 2015-02-17 (×4): qty 1

## 2015-02-17 MED ORDER — LORAZEPAM 0.5 MG PO TABS
0.2500 mg | ORAL_TABLET | Freq: Two times a day (BID) | ORAL | Status: DC
  Administered 2015-02-17 – 2015-02-22 (×7): 0.25 mg via ORAL
  Filled 2015-02-17 (×8): qty 1

## 2015-02-17 MED ORDER — DEXTROSE 5 % IV SOLN
2.0000 g | Freq: Three times a day (TID) | INTRAVENOUS | Status: DC
  Administered 2015-02-17: 2 g via INTRAVENOUS
  Filled 2015-02-17 (×4): qty 2

## 2015-02-17 MED ORDER — WARFARIN - PHARMACIST DOSING INPATIENT: Freq: Every day | Status: DC

## 2015-02-17 MED ORDER — METOPROLOL TARTRATE 25 MG PO TABS
25.0000 mg | ORAL_TABLET | Freq: Two times a day (BID) | ORAL | Status: DC
  Administered 2015-02-17 – 2015-02-22 (×10): 25 mg via ORAL
  Filled 2015-02-17 (×10): qty 1

## 2015-02-17 MED ORDER — ONDANSETRON HCL 4 MG PO TABS
4.0000 mg | ORAL_TABLET | Freq: Four times a day (QID) | ORAL | Status: DC | PRN
  Administered 2015-02-19: 13:00:00 4 mg via ORAL
  Filled 2015-02-17: qty 1

## 2015-02-17 MED ORDER — LEVOTHYROXINE SODIUM 50 MCG PO TABS
50.0000 ug | ORAL_TABLET | Freq: Every day | ORAL | Status: DC
Start: 2015-02-17 — End: 2015-02-22
  Administered 2015-02-17 – 2015-02-22 (×6): 50 ug via ORAL
  Filled 2015-02-17 (×6): qty 1

## 2015-02-17 MED ORDER — BIOTIN 300 MCG PO TABS: 1.0000 | ORAL_TABLET | Freq: Every day | ORAL | Status: DC

## 2015-02-17 MED ORDER — RISAQUAD PO CAPS
1.0000 | ORAL_CAPSULE | Freq: Every day | ORAL | Status: DC
  Administered 2015-02-17 – 2015-02-22 (×5): 1 via ORAL
  Filled 2015-02-17 (×6): qty 1

## 2015-02-17 NOTE — Progress Notes (Signed)
Pt remains on n/c, still satting 100%, sitter at the bedside from facility, pt more vocal, but still appearing to be confused at times.  Is refusing some meds.  BP stable.

## 2015-02-17 NOTE — H&P (Signed)
[name] is an 81 y.o. female.   Chief Complaint: Decreased responsiveness HPI: The patient presents to the emergency department via EMS after an acute episode of desaturation at home. The patient had been talkative and joking with her caregiver prior to said onset of hypoxia seen on pulse oximetry. Upon arrival to emergency department the patient required BiPAP ventilation with 60% FiO2. She became barely responsive to stimuli and thus cannot continue to her own review of systems. She was found to have a urinary tract infection and sepsis which prompted the emergency department to call for admission.  Past Medical History  Diagnosis Date  . Coronary artery disease     s/p CABG 1998 and s/p stents placed in the right coronary artery in 01/2008  . Atrial fibrillation   . Carotid stenosis   . Unspecified diastolic heart failure   . Hypertension   . Hyperlipidemia   . Pulmonary hypertension   . Renal artery stenosis     bilateral  . Chronic renal insufficiency   . Diverticular disease   . IBS (irritable bowel syndrome)   . Anemia   . Endometriosis   . Hypothermia     Past Surgical History  Procedure Laterality Date  . Coronary artery bypass graft  1998  . Total hip arthroplasty  1995    right  . Abdominal hysterectomy    . Ovarian cyst removal    . Coronary angioplasty with stent placement    . Renal artery stent  06/2003    right    History reviewed. No pertinent family history. Social History:  reports that she has never smoked. She has never used smokeless tobacco. She reports that she drinks alcohol. She reports that she does not use illicit drugs.  Allergies:  Allergies  Allergen Reactions  . Codeine Phosphate     REACTION: myalgia  . Penicillins     REACTION: questionable    Medications Prior to Admission  Medication Sig Dispense Refill  . aspirin 81 MG EC tablet Take 81 mg by mouth daily.      . benazepril (LOTENSIN) 10 MG tablet Take 10 mg by mouth 2  (two) times daily.      . Biotin 300 MCG TABS Take 1 tablet by mouth daily.    . Cholecalciferol (VITAMIN D3) 1000 UNITS CAPS Take 1 tablet by mouth daily.      Marland Kitchen docusate sodium (COLACE) 100 MG capsule Take 200 mg by mouth daily.    . DULoxetine (CYMBALTA) 30 MG capsule Take 30 mg by mouth daily.    . hyoscyamine (LEVSIN SL) 0.125 MG SL tablet Place 0.125 mg under the tongue every 6 (six) hours as needed. For stomach spasms     . levothyroxine (SYNTHROID, LEVOTHROID) 50 MCG tablet Take 50 mcg by mouth daily.      Marland Kitchen LORazepam (ATIVAN) 0.5 MG tablet Take 0.5 mg by mouth every 8 (eight) hours. Take 1/2 tablet in the am, 1/2 at noon, and full tablet in the pm    . metoprolol tartrate (LOPRESSOR) 25 MG tablet Take 25 mg by mouth 2 (two) times daily.    . Multiple Vitamins-Minerals (CENTRUM SILVER ULTRA WOMENS PO) Take 1 tablet by mouth daily.      . nitroGLYCERIN (NITROSTAT) 0.4 MG SL tablet Place 0.4 mg under the tongue every 5 (five) minutes as needed.      . pravastatin (PRAVACHOL) 40 MG tablet Take 40 mg by mouth daily.      Marland Kitchen  Probiotic Product (FLORA-Q PO) Take 1 tablet by mouth daily.      . temazepam (RESTORIL) 22.5 MG capsule Take 22.5 mg by mouth at bedtime as needed.    . TORSEMIDE PO Take 20 mg by mouth every other day. Takes 1/2 tablet every other day.    . traMADol (ULTRAM) 50 MG tablet Take 50 mg by mouth every 6 (six) hours as needed.      . traZODone (DESYREL) 50 MG tablet Take 50 mg by mouth at bedtime.     Marland Kitchen warfarin (COUMADIN) 2.5 MG tablet Take 2.5 mg by mouth daily.      Results for orders placed or performed during the hospital encounter of 02/16/15 (from the past 48 hour(s))  TSH     Status: Abnormal   Collection Time: 02/16/15  9:57 PM  Result Value Ref Range   TSH 27.206 (H) 0.350 - 4.500 uIU/mL  Comprehensive metabolic panel     Status: Abnormal   Collection Time: 02/16/15  9:59 PM  Result Value Ref Range   Sodium 134 (L) 135 - 145 mmol/L   Potassium 4.6 3.5 - 5.1  mmol/L   Chloride 94 (L) 101 - 111 mmol/L   CO2 29 22 - 32 mmol/L   Glucose, Bld 141 (H) 65 - 99 mg/dL   BUN 35 (H) 6 - 20 mg/dL   Creatinine, Ser 1.00 0.44 - 1.00 mg/dL   Calcium 8.5 (L) 8.9 - 10.3 mg/dL   Total Protein 7.3 6.5 - 8.1 g/dL   Albumin 3.5 3.5 - 5.0 g/dL   AST 26 15 - 41 U/L   ALT 11 (L) 14 - 54 U/L   Alkaline Phosphatase 195 (H) 38 - 126 U/L   Total Bilirubin 1.1 0.3 - 1.2 mg/dL   GFR calc non Af Amer 48 (L) >60 mL/min   GFR calc Af Amer 56 (L) >60 mL/min    Comment: (NOTE) The eGFR has been calculated using the CKD EPI equation. This calculation has not been validated in all clinical situations. eGFR's persistently <60 mL/min signify possible Chronic Kidney Disease.    Anion gap 11 5 - 15  CBC WITH DIFFERENTIAL     Status: Abnormal   Collection Time: 02/16/15  9:59 PM  Result Value Ref Range   WBC 11.7 (H) 3.6 - 11.0 K/uL   RBC 3.17 (L) 3.80 - 5.20 MIL/uL   Hemoglobin 9.8 (L) 12.0 - 16.0 g/dL   HCT 30.7 (L) 35.0 - 47.0 %   MCV 96.7 80.0 - 100.0 fL   MCH 30.8 26.0 - 34.0 pg   MCHC 31.8 (L) 32.0 - 36.0 g/dL   RDW 16.3 (H) 11.5 - 14.5 %   Platelets 142 (L) 150 - 440 K/uL   Neutrophils Relative % 70 %   Neutro Abs 8.1 (H) 1.4 - 6.5 K/uL   Lymphocytes Relative 15 %   Lymphs Abs 1.8 1.0 - 3.6 K/uL   Monocytes Relative 9 %   Monocytes Absolute 1.0 (H) 0.2 - 0.9 K/uL   Eosinophils Relative 5 %   Eosinophils Absolute 0.6 0 - 0.7 K/uL   Basophils Relative 1 %   Basophils Absolute 0.1 0 - 0.1 K/uL  Lactic acid, plasma     Status: None   Collection Time: 02/16/15  9:59 PM  Result Value Ref Range   Lactic Acid, Venous 1.8 0.5 - 2.0 mmol/L  Troponin I     Status: None   Collection Time: 02/16/15  9:59 PM  Result  Value Ref Range   Troponin I 0.03 <0.031 ng/mL    Comment:        NO INDICATION OF MYOCARDIAL INJURY.   Protime-INR     Status: Abnormal   Collection Time: 02/16/15  9:59 PM  Result Value Ref Range   Prothrombin Time 16.3 (H) 11.4 - 15.0 seconds    INR 1.29   APTT     Status: Abnormal   Collection Time: 02/16/15  9:59 PM  Result Value Ref Range   aPTT 48 (H) 24 - 36 seconds    Comment:        IF BASELINE aPTT IS ELEVATED, SUGGEST PATIENT RISK ASSESSMENT BE USED TO DETERMINE APPROPRIATE ANTICOAGULANT THERAPY.   Urinalysis complete, with microscopic ([facility] only)     Status: Abnormal   Collection Time: 02/16/15 10:35 PM  Result Value Ref Range   Color, Urine YELLOW (A) YELLOW   APPearance CLOUDY (A) CLEAR   Glucose, UA NEGATIVE NEGATIVE mg/dL   Bilirubin Urine NEGATIVE NEGATIVE   Ketones, ur NEGATIVE NEGATIVE mg/dL   Specific Gravity, Urine 1.011 1.005 - 1.030   Hgb urine dipstick 3+ (A) NEGATIVE   pH 5.0 5.0 - 8.0   Protein, ur 30 (A) NEGATIVE mg/dL   Nitrite NEGATIVE NEGATIVE   Leukocytes, UA 2+ (A) NEGATIVE   RBC / HPF TOO NUMEROUS TO COUNT 0 - 5 RBC/hpf   WBC, UA TOO NUMEROUS TO COUNT 0 - 5 WBC/hpf   Bacteria, UA FEW (A) NONE SEEN   Squamous Epithelial / LPF 6-30 (A) NONE SEEN   WBC Clumps PRESENT    Mucous PRESENT    Hyaline Casts, UA PRESENT    Ct Head Wo Contrast  02/17/2015   CLINICAL DATA:  Nursing home patient, difficulty breathing, febrile and hypoxia. Altered mental status for 1 day. History of dementia, chronic renal disease, hypertension.  EXAM: CT HEAD WITHOUT CONTRAST  TECHNIQUE: Contiguous axial images were obtained from the base of the skull through the vertex without intravenous contrast.  COMPARISON:  CT head September 28, 2011  FINDINGS: Mildly motion degraded examination.  The ventricles and sulci are normal for age. No intraparenchymal hemorrhage, mass effect nor midline shift. Patchy supratentorial white matter hypodensities are within normal range for patient's age and though non-specific suggest sequelae of chronic small vessel ischemic disease. No acute large vascular territory infarcts.  No abnormal extra-axial fluid collections. Basal cisterns are patent. Moderate calcific atherosclerosis of the carotid  siphons.  No skull fracture. The included ocular globes and orbital contents are non-suspicious. Bilateral ocular lens implants. LEFT middle ear and mastoid effusion, moderate RIGHT mastoid effusion without air cell coalescence. Paranasal sinuses are well-aerated. Moderate temporomandibular osteoarthrosis.  IMPRESSION: No acute intracranial process.  LEFT middle ear and M bilateral mastoid effusions.  Stable chronic changes including moderate white matter changes compatible chronic small vessel ischemic disease.   Electronically Signed   By: Elon Alas M.D.   On: 02/17/2015 00:56   Dg Chest Port 1 View  02/16/2015   CLINICAL DATA:  Acute onset of difficulty breathing. Fever and decreased O2 saturation. Initial encounter.  EXAM: PORTABLE CHEST - 1 VIEW  COMPARISON:  Chest radiograph performed 02/28/2014  FINDINGS: The lungs are well-aerated. A small right pleural effusion is noted. Underlying vascular congestion is seen. Increased interstitial markings raise concern for pulmonary edema. No pneumothorax is identified.  The cardiomediastinal silhouette is enlarged. The patient is status post median sternotomy. No acute osseous abnormalities are seen.  IMPRESSION: Small right pleural  effusion noted. Underlying vascular congestion and cardiomegaly. Increased interstitial markings raise concern for pulmonary edema.   Electronically Signed   By: Garald Balding M.D.   On: 02/16/2015 22:39    Review of Systems  Unable to perform ROS: medical condition    Blood pressure 139/63, pulse 73, temperature 97.7 F (36.5 C), temperature source Oral, resp. rate 14, height 5' 4"  (1.626 m), weight 81.9 kg (180 lb 8.9 oz), SpO2 99 %. Physical Exam  Vitals reviewed. Constitutional: She is oriented to person, place, and time. She appears well-developed and well-nourished.  HENT:  Head: Normocephalic and atraumatic.  BiPAP in place  Eyes: Conjunctivae and EOM are normal. Pupils are equal, round, and reactive to  light. No scleral icterus.  Neck: Normal range of motion. Neck supple. No JVD present. No tracheal deviation present. No thyromegaly present.  Cardiovascular: Normal rate, regular rhythm, normal heart sounds and intact distal pulses.  Exam reveals no gallop and no friction rub.   No murmur heard. Respiratory: Effort normal and breath sounds normal. No respiratory distress. She has no wheezes. She has no rales.  GI: Soft. Bowel sounds are normal. She exhibits no distension. There is no tenderness.  Genitourinary:  Deferred  Musculoskeletal: Normal range of motion. She exhibits no edema.  severe Hammertoes b/l  Lymphadenopathy:    She has no cervical adenopathy.  Neurological: She is alert and oriented to person, place, and time. No cranial nerve deficit. She exhibits normal muscle tone.  Skin: Skin is warm and dry. There is erythema.  Lower extremities  Psychiatric: She has a normal mood and affect. Judgment and thought content normal.     Assessment/Plan This is an 81 year old Caucasian female admitted for sepsis. 1. Sepsis: Source is likely the urine. Patient has received aztreonam, vancomycin, and Levaquin in the emergency department. We will follow sensitivities and change antibiotic coverage as needed. 2. UTI: Present on admission 3. Respiratory failure: The patient is currently on BiPAP. We will decrease supplement oxygen as tolerated.  4. Hypotension: The patient is fluid responsive. She is likely very dehydrated secondary to poor by mouth intake and her urinary tract infection. Continue intravenous fluid 5. CHF: Diastolic. Preserved ejection fraction. 6. Coronary artery disease: Stable continue secondary prevention 7. DVT prophylaxis: Heparin while the patient is nothing by mouth. May resume warfarin once she is taking medicines by mouth. 8. GI prophylaxis: None The patient is DO NOT RESUSCITATE. Time spent on admission orders and critical patient care approximately 45  minutes.   Harrie Foreman 02/17/2015, 6:37 AM

## 2015-02-17 NOTE — ED Notes (Signed)
Pt returned to room from CT scan. RT in room to put pt back on bipap. Family at bedside.

## 2015-02-17 NOTE — Care Management Note (Signed)
Case Management Note  Patient Details  Name: [name] MRN: [id number] Date of Birth: June 14, 1923  Subjective/Objective:   Admitted with UTI and Sepsis. Met with spouse at bedside. Patient resides at [facility], 3rd floor. Spouse very concerned about the care she is getting there. She was active with [facility] until last Friday when they discharged her. Following.                  Action/Plan:   Expected Discharge Date:                  Expected Discharge Plan:     In-House Referral:     Discharge planning Services     Post Acute Care Choice:    Choice offered to:     DME Arranged:    DME Agency:     HH Arranged:    [place]:     Status of Service:     Medicare Important Message Given:  Yes Date Medicare IM Given:  02/17/15 Medicare IM give by:  Orvan July Date Additional Medicare IM Given:    Additional Medicare Important Message give by:     If discussed at [place] of Stay Meetings, dates discussed:    Additional Comments:  Jolly Mango, RN 02/17/2015, 1:49 PM

## 2015-02-17 NOTE — Progress Notes (Signed)
[facility] - [place] at [facility]   PATIENT NAME: [name]    MR#:  [id number]  DATE OF BIRTH:  [birth year]  SUBJECTIVE:  CHIEF COMPLAINT:   Chief Complaint  Patient presents with  . Shortness of Breath   Doing well. Alert, smiling with daughter and caregivers. Not oriented.  REVIEW OF SYSTEMS:   ROS Unable to perform due to dementia  DRUG ALLERGIES:   Allergies  Allergen Reactions  . Codeine Phosphate     REACTION: myalgia  . Penicillins     REACTION: questionable    VITALS:  Blood pressure 141/114, pulse 73, temperature 97.7 F (36.5 C), temperature source Oral, resp. rate 16, height  (1.626 m), weight 81.9 kg (180 lb 8.9 oz), SpO2 98 %.  PHYSICAL EXAMINATION:  GENERAL:  81 y.o.-year-old patient lying in the bed with no acute distress.  EYES: Pupils equal, round, reactive to light and accommodation. No scleral icterus. Extraocular muscles intact.  HEENT: Head atraumatic, normocephalic. Oropharynx and nasopharynx clear. MMdry NECK:  Supple, no jugular venous distention. No thyroid enlargement, no tenderness.  LUNGS: Normal breath sounds bilaterally, no wheezing, rales, rhonchi or crepitation. No use of accessory muscles of respiration. nasal canula  CARDIOVASCULAR: S1, S2 normal. No murmurs, rubs, or gallops.  ABDOMEN: Soft, nontender, nondistended. Bowel sounds present. No organomegaly or mass.  EXTREMITIES: No pedal edema, cyanosis, or clubbing. cold NEUROLOGIC: Cranial nerves II through XII are intact. Moves extremities spontaneously PSYCHIATRIC: The patient is alert and calm SKIN: No obvious rash, lesion, or ulcer.    LABORATORY PANEL:   CBC  Recent Labs Lab 02/16/15 2159  WBC 11.7*  HGB 9.8*  HCT 30.7*  PLT 142*   ------------------------------------------------------------------------------------------------------------------  Chemistries   Recent Labs Lab 02/16/15 2159  NA 134*  K 4.6  CL 94*  CO2 29   GLUCOSE 141*  BUN 35*  CREATININE 1.00  CALCIUM 8.5*  AST 26  ALT 11*  ALKPHOS 195*  BILITOT 1.1   ------------------------------------------------------------------------------------------------------------------  Cardiac Enzymes  Recent Labs Lab 02/16/15 2159  TROPONINI 0.03   ------------------------------------------------------------------------------------------------------------------  RADIOLOGY:  Ct Head Wo Contrast  02/17/2015   CLINICAL DATA:  Nursing home patient, difficulty breathing, febrile and hypoxia. Altered mental status for 1 day. History of dementia, chronic renal disease, hypertension.  EXAM: CT HEAD WITHOUT CONTRAST  TECHNIQUE: Contiguous axial images were obtained from the base of the skull through the vertex without intravenous contrast.  COMPARISON:  CT head September 28, 2011  FINDINGS: Mildly motion degraded examination.  The ventricles and sulci are normal for age. No intraparenchymal hemorrhage, mass effect nor midline shift. Patchy supratentorial white matter hypodensities are within normal range for patient's age and though non-specific suggest sequelae of chronic small vessel ischemic disease. No acute large vascular territory infarcts.  No abnormal extra-axial fluid collections. Basal cisterns are patent. Moderate calcific atherosclerosis of the carotid siphons.  No skull fracture. The included ocular globes and orbital contents are non-suspicious. Bilateral ocular lens implants. LEFT middle ear and mastoid effusion, moderate RIGHT mastoid effusion without air cell coalescence. Paranasal sinuses are well-aerated. Moderate temporomandibular osteoarthrosis.  IMPRESSION: No acute intracranial process.  LEFT middle ear and M bilateral mastoid effusions.  Stable chronic changes including moderate white matter changes compatible chronic small vessel ischemic disease.   Electronically Signed   By: Awilda Metro M.D.   On: 02/17/2015 00:56   Dg Chest Port 1  View  02/16/2015   CLINICAL DATA:  Acute onset of difficulty breathing.  Fever and decreased O2 saturation. Initial encounter.  EXAM: PORTABLE CHEST - 1 VIEW  COMPARISON:  Chest radiograph performed 02/28/2014  FINDINGS: The lungs are well-aerated. A small right pleural effusion is noted. Underlying vascular congestion is seen. Increased interstitial markings raise concern for pulmonary edema. No pneumothorax is identified.  The cardiomediastinal silhouette is enlarged. The patient is status post median sternotomy. No acute osseous abnormalities are seen.  IMPRESSION: Small right pleural effusion noted. Underlying vascular congestion and cardiomegaly. Increased interstitial markings raise concern for pulmonary edema.   Electronically Signed   By: Roanna Raider M.D.   On: 02/16/2015 22:39    EKG:   Orders placed or performed during the hospital encounter of 02/16/15  . ED EKG 12-Lead  . ED EKG 12-Lead  . EKG 12-Lead  . EKG 12-Lead    ASSESSMENT AND PLAN:    This is an 81 year old Caucasian female admitted for sepsis.  1. Sepsis: Improving.   - likely due to UTI - cultures pending - blood pressure stable, no pressors, now alert and stable. Transfer to floor - continue aztreonam, vanc and levaquin  2. UTI:  - as above - foley in place, remove when able  3. Respiratory failure: resolved - briefly on BiPap  4. Hypotension:  - resolved with IVF  5. CHF: Diastolic.  - no current exacerbation - Preserved ejection fraction.  6. Coronary artery disease: Stable continue secondary prevention  7. DVT prophylaxis: coumadin per pharmacy  8. GI prophylaxis: None  The patient is DO NOT RESUSCITATE.   All the records are reviewed and case discussed with Care Management/Social Workerr. Management plans discussed with the patient, family and they are in agreement.  TOTAL TIME TAKING CARE OF THIS PATIENT: 35 minutes.   POSSIBLE D/C IN 2-3 DAYS, DEPENDING ON CLINICAL  CONDITION.  Greater than 50% of time spent coordinating care and counseling daughter/care givers.  Elby Showers M.D on 02/17/2015 at 12:37 PM  Between 7am to 6pm - Pager - [phone]  After 6pm go to [email or web address] - password [email or web address]  [facility]  Office  [phone]  CC: Primary care physician; Pcp Not In System

## 2015-02-17 NOTE — ED Notes (Signed)
Husband and daughter have gone home. Correct contact information verified prior to family leaving. Daughter would like to be called with CT results. Pt's private sitter remains at bedside.

## 2015-02-17 NOTE — Evaluation (Signed)
Clinical/Bedside Swallow Evaluation Patient Details  Name: [name] MRN: [id number] Date of Birth: 1923/07/01  Today's Date: 02/17/2015 Time: SLP Start Time (ACUTE ONLY): 1402 SLP Stop Time (ACUTE ONLY): 1445 SLP Time Calculation (min) (ACUTE ONLY): 43 min  Past Medical History:  Past Medical History  Diagnosis Date  . Coronary artery disease     s/p CABG 1998 and s/p stents placed in the right coronary artery in 01/2008  . Atrial fibrillation   . Carotid stenosis   . Unspecified diastolic heart failure   . Hypertension   . Hyperlipidemia   . Pulmonary hypertension   . Renal artery stenosis     bilateral  . Chronic renal insufficiency   . Diverticular disease   . IBS (irritable bowel syndrome)   . Anemia   . Endometriosis   . Hypothermia    Past Surgical History:  Past Surgical History  Procedure Laterality Date  . Coronary artery bypass graft  1998  . Total hip arthroplasty  1995    right  . Abdominal hysterectomy    . Ovarian cyst removal    . Coronary angioplasty with stent placement    . Renal artery stent  06/2003    right   HPI:  Pt admitted after acute episode of desturation at home. After she was brought into hospital found to have a UTI and sepsis. Pt was briefly on BIPAP but now on nasal canula and alert.    Assessment / Plan / Recommendation Clinical Impression  Pt presents oropharyngeal swallow WFL for Dysphagia I diet w/thin liquids. Pt demonstrated no overt s/s of aspiration w/any puree and thin consistencies. Oral phase also appeared [facility] for puree and thin. Did not attempt solid d/t pt is on puree diet w/thin liquids (no straws) at baseline. Pt is at min increased risk of aspiration d/t cognitive status but risk of aspiration decreases with modified diet and use of aspiration precautions. Recommend continue Dys I diet w/thin liquids. Will f/u in 1-3 days re: toleration of diet.     Aspiration Risk  Mild    Diet Recommendation Dysphagia 1  (Puree);Thin   Medication Administration: Crushed with puree Compensations: Minimize environmental distractions;Slow rate;Small sips/bites    Other  Recommendations Oral Care Recommendations: Staff/trained caregiver to provide oral care   Follow Up Recommendations       Frequency and Duration min 2x/week  1 week   Pertinent Vitals/Pain Denies pain    SLP Swallow Goals     Swallow Study Prior Functional Status   Pt was reportedly consuming a puree diet w/thin liquids at baseline with no straws.     General Date of Onset: 02/16/15 Other Pertinent Information: Pt admitted after acute episode of desturation at home. After she was brought into hospital found to have a UTI and sepsis. Pt was briefly on BIPAP but now on nasal canula and alert.  Type of Study: Bedside swallow evaluation Previous Swallow Assessment: Pt's family reports pt has h/o dysphagia post several years ago and was recommended to avoid straws.  Diet Prior to this Study: Thin liquids;Dysphagia 1 (puree) Temperature Spikes Noted: No Respiratory Status: Supplemental O2 delivered via (comment) (Nasal cannula) History of Recent Intubation: No Behavior/Cognition: Alert;Cooperative;Pleasant mood;Confused Oral Cavity - Dentition: Adequate natural dentition/normal for age Self-Feeding Abilities: Total assist;Needs set up Patient Positioning: Upright in bed Baseline Vocal Quality: Normal Volitional Cough: Strong Volitional Swallow: Able to elicit    Oral/Motor/Sensory Function Overall Oral Motor/Sensory Function: Appears within functional limits for tasks assessed  Labial ROM: Within Functional Limits Labial Symmetry: Within Functional Limits Labial Strength: Within Functional Limits Labial Sensation: Within Functional Limits Lingual ROM: Within Functional Limits Lingual Symmetry: Within Functional Limits Lingual Strength: Within Functional Limits Lingual Sensation: Within Functional Limits Facial ROM: Within  Functional Limits Facial Symmetry: Within Functional Limits Facial Strength: Within Functional Limits Facial Sensation: Within Functional Limits Velum: Within Functional Limits Mandible: Within Functional Limits   Ice Chips Ice chips: Within functional limits Presentation: Spoon Other Comments: 1 tsp   Thin Liquid Thin Liquid: Within functional limits Presentation: Cup Other Comments: 5 ounces     Nectar Thick Nectar Thick Liquid: Not tested   Honey Thick Honey Thick Liquid: Not tested   Puree Puree: Within functional limits Presentation: Spoon Other Comments: 7 tsps    Solid   GO    Solid: Not tested Other Comments: d/t pt on puree diet at baseline       Waverly,[name] 02/17/2015,2:56 PM

## 2015-02-17 NOTE — Progress Notes (Signed)
Pt alert to self only. Pt does have own home sitter. Remaining on 2L of oxygen, pt does have to be reminded to keep it on. Foley patent and draining urine. Iv infusing without difficulty.

## 2015-02-17 NOTE — Progress Notes (Signed)
ANTICOAGULATION CONSULT NOTE - Initial Consult  Pharmacy Consult for Warfarin Indication: atrial fibrillation  Allergies  Allergen Reactions  . Codeine Phosphate     REACTION: myalgia  . Penicillins     REACTION: questionable    Patient Measurements: Height: 5\' 4"  (162.6 cm) Weight: 180 lb 8.9 oz (81.9 kg) IBW/kg (Calculated) : 54.7   Vital Signs: Temp: 97.8 F (36.6 C) (06/24 0800) Temp Source: Axillary (06/24 0800) BP: 108/69 mmHg (06/24 1417) Pulse Rate: 79 (06/24 1417)  Labs:  Recent Labs  02/16/15 2159  HGB 9.8*  HCT 30.7*  PLT 142*  APTT 48*  LABPROT 16.3*  INR 1.29  CREATININE 1.00  TROPONINI 0.03    Estimated Creatinine Clearance: 39.5 mL/min (by C-G formula based on Cr of 1).   Medical History: Past Medical History  Diagnosis Date  . Coronary artery disease     s/p CABG 1998 and s/p stents placed in the right coronary artery in 01/2008  . Atrial fibrillation   . Carotid stenosis   . Unspecified diastolic heart failure   . Hypertension   . Hyperlipidemia   . Pulmonary hypertension   . Renal artery stenosis     bilateral  . Chronic renal insufficiency   . Diverticular disease   . IBS (irritable bowel syndrome)   . Anemia   . Endometriosis   . Hypothermia     Medications:  Scheduled:  . acidophilus  1 capsule Oral Daily  . aspirin EC  81 mg Oral Daily  . benazepril  10 mg Oral BID  . Biotin  1 tablet Oral Daily  . docusate sodium  200 mg Oral Daily  . DULoxetine  30 mg Oral Daily  . heparin  5,000 Units Subcutaneous 3 times per day  . [START ON 02/18/2015] levofloxacin (LEVAQUIN) IV  750 mg Intravenous Q48H  . levothyroxine  50 mcg Oral Daily  . LORazepam  0.25 mg Oral BID WC  . LORazepam  0.5 mg Oral QPM  . metoprolol tartrate  25 mg Oral BID  . pravastatin  40 mg Oral Daily  . torsemide  10 mg Oral QODAY  . traZODone  50 mg Oral QHS  . vancomycin  750 mg Intravenous Q18H  . [START ON 02/18/2015] warfarin  2.5 mg Oral Daily  .  Warfarin - Pharmacist Dosing Inpatient   Does not apply q1800    Assessment: 81 yo female on Warfarin for Atrial Fibrillaton. Home dose is 2.5mg  daily.  Goal of Therapy:  INR 2-3 Monitor platelets by anticoagulation protocol: Yes   Plan:  Will continue current dose of Warfarin 2.5 mg daily at 1100. INR ordered for am. Patient on Levaquin,Vancomycin. Patient on Heparin subcutaneous q8h currently.  Bari Mantis PharmD Clinical Pharmacist 02/17/2015

## 2015-02-17 NOTE — ED Notes (Signed)
Pt report received from [facility]. Pt care assumed. Pt resting in bed with cardiac monitor in place, family at bedside.

## 2015-02-17 NOTE — Consult Note (Signed)
ANTIBIOTIC CONSULT NOTE - Follow up  Pharmacy Consult for Vancomycin, Levaquin Indication: Sepsis/UTI  Allergies  Allergen Reactions  . Codeine Phosphate     REACTION: myalgia  . Penicillins     REACTION: questionable    Patient Measurements: Height:  (162.6 cm) Weight: 180 lb 8.9 oz (81.9 kg) IBW/kg (Calculated) : 54.7 Adjusted Body Weight: 65.6kg  Vital Signs: Temp: 97.8 F (36.6 C) (06/24 0800) Temp Source: Axillary (06/24 0800) BP: 108/69 mmHg (06/24 1417) Pulse Rate: 79 (06/24 1417) Intake/Output from previous day: 06/23 0701 - 06/24 0700 In: 1000 [IV Piggyback:1000] Out: -  Intake/Output from this shift: Total I/O In: -  Out: 350 [Urine:350]  Labs:  Recent Labs  02/16/15 2159  WBC 11.7*  HGB 9.8*  PLT 142*  CREATININE 1.00   Estimated Creatinine Clearance: 39.5 mL/min (by C-G formula based on Cr of 1).  Microbiology: Recent Results (from the past 720 hour(s))  MRSA PCR Screening     Status: None   Collection Time: 02/16/15  9:37 PM  Result Value Ref Range Status   MRSA by PCR NEGATIVE NEGATIVE Final    Comment:        The GeneXpert MRSA Assay (FDA approved for NASAL specimens only), is one component of a comprehensive MRSA colonization surveillance program. It is not intended to diagnose MRSA infection nor to guide or monitor treatment for MRSA infections.   Blood Culture (routine x 2)     Status: None (Preliminary result)   Collection Time: 02/16/15 10:00 PM  Result Value Ref Range Status   Specimen Description BLOOD  Final   Special Requests NONE  Final   Culture  Setup Time   Final    GRAM POSITIVE COCCI IN BOTH AEROBIC AND ANAEROBIC BOTTLES IDENTIFICATION TO FOLLOW CRITICAL RESULT CALLED TO, READ BACK BY AND VERIFIED WITH: BRITTNEY KILLINGSWORTH ON 02/17/15 AT 1325 BY JEF    Culture GRAM POSITIVE COCCI IDENTIFICATION TO FOLLOW   Final   Report Status PENDING  Incomplete  Urine culture     Status: None (Preliminary result)   Collection Time: 02/16/15 10:35 PM  Result Value Ref Range Status   Specimen Description URINE, CLEAN CATCH  Final   Special Requests NONE  Final   Culture NO GROWTH < 12 HOURS  Final   Report Status PENDING  Incomplete  Blood Culture (routine x 2)     Status: None (Preliminary result)   Collection Time: 02/16/15 10:55 PM  Result Value Ref Range Status   Specimen Description BLOOD  Final   Special Requests NONE  Final   Culture  Setup Time   Final    GRAM POSITIVE COCCI IN BOTH AEROBIC AND ANAEROBIC BOTTLES IDENTIFICATION TO FOLLOW CRITICAL RESULT CALLED TO, READ BACK BY AND VERIFIED WITH: BRITTNEY KILLINGSWORTH ON 02/17/15 AT 1325 BY JEF    Culture GRAM POSITIVE COCCI IDENTIFICATION TO FOLLOW   Final   Report Status PENDING  Incomplete    Medical History: Past Medical History  Diagnosis Date  . Coronary artery disease     s/p CABG 1998 and s/p stents placed in the right coronary artery in 01/2008  . Atrial fibrillation   . Carotid stenosis   . Unspecified diastolic heart failure   . Hypertension   . Hyperlipidemia   . Pulmonary hypertension   . Renal artery stenosis     bilateral  . Chronic renal insufficiency   . Diverticular disease   . IBS (irritable bowel syndrome)   . Anemia   .  Endometriosis   . Hypothermia     Medications:  Anti-infectives    Start     Dose/Rate Route Frequency Ordered Stop   02/18/15 1800  levofloxacin (LEVAQUIN) IVPB 750 mg    Comments:  Patient started Levaquin on 6/23.   750 mg 100 mL/hr over 90 Minutes Intravenous Every 48 hours 02/17/15 0436     02/17/15 1700  vancomycin (VANCOCIN) IVPB 750 mg/150 ml premix    Comments:  Patient started Vancomycin on 6/23.   750 mg 150 mL/hr over 60 Minutes Intravenous Every 18 hours 02/17/15 0438     02/17/15 0830  aztreonam (AZACTAM) 2 g in dextrose 5 % 50 mL IVPB  Status:  Discontinued     2 g 100 mL/hr over 30 Minutes Intravenous Every 8 hours 02/17/15 0436 02/17/15 1443   02/16/15 2200   levofloxacin (LEVAQUIN) IVPB 750 mg     750 mg 100 mL/hr over 90 Minutes Intravenous  Once 02/16/15 2159 02/17/15 0049   02/16/15 2200  aztreonam (AZACTAM) 2 g in dextrose 5 % 50 mL IVPB     2 g 100 mL/hr over 30 Minutes Intravenous  Once 02/16/15 2159 02/17/15 0100   02/16/15 2200  vancomycin (VANCOCIN) IVPB 1000 mg/200 mL premix     1,000 mg 200 mL/hr over 60 Minutes Intravenous  Once 02/16/15 2159 02/17/15 0012     Assessment: 81 yo female presenting to ED with fever and hypoxia.  Pharmacy consulted for dosing of Vancomycin, Levaquin, and Azactam.    Goal of Therapy:  Vancomycin trough level 15-20 mcg/ml  Plan:  Ordered Vancomycin 1g IV in the ED, followed by Vancomycin 750mg  IV Q18H to begin 6/24 at 17:00.  Trough level ordered for 6/26 at 22:30.  Ordered  Levaquin 750mg  IV Q48H.  Aztreonam discontinued. UA; +, Ucx pending.  Bcx: + GPC  Pharmacy to follow and adjust as per consult.     Bari Mantis PharmD Clinical Pharmacist 02/17/2015

## 2015-02-17 NOTE — Consult Note (Signed)
ANTIBIOTIC CONSULT NOTE - INITIAL  Pharmacy Consult for Vancomycin, Levaquin, and Azactam Indication: Sepsis  Allergies  Allergen Reactions  . Codeine Phosphate     REACTION: myalgia  . Penicillins     REACTION: questionable    Patient Measurements: Height: 5\' 4"  (162.6 cm) Weight: 180 lb 8.9 oz (81.9 kg) IBW/kg (Calculated) : 54.7 Adjusted Body Weight: 65.6kg  Vital Signs: Temp: 97.7 F (36.5 C) (06/24 0406) Temp Source: Oral (06/24 0406) BP: 129/74 mmHg (06/24 0406) Pulse Rate: 76 (06/24 0406) Intake/Output from previous day: 06/23 0701 - 06/24 0700 In: 1000 [IV Piggyback:1000] Out: -  Intake/Output from this shift: Total I/O In: 1000 [IV Piggyback:1000] Out: -   Labs:  Recent Labs  02/16/15 2159  WBC 11.7*  HGB 9.8*  PLT 142*  CREATININE 1.00   Estimated Creatinine Clearance: 39.5 mL/min (by C-G formula based on Cr of 1).  Microbiology: No results found for this or any previous visit (from the past 720 hour(s)).  Medical History: Past Medical History  Diagnosis Date  . Coronary artery disease     s/p CABG 1998 and s/p stents placed in the right coronary artery in 01/2008  . Atrial fibrillation   . Carotid stenosis   . Unspecified diastolic heart failure   . Hypertension   . Hyperlipidemia   . Pulmonary hypertension   . Renal artery stenosis     bilateral  . Chronic renal insufficiency   . Diverticular disease   . IBS (irritable bowel syndrome)   . Anemia   . Endometriosis   . Hypothermia     Medications:  Anti-infectives    Start     Dose/Rate Route Frequency Ordered Stop   02/18/15 1800  levofloxacin (LEVAQUIN) IVPB 750 mg    Comments:  Patient started Levaquin on 6/23.   750 mg 100 mL/hr over 90 Minutes Intravenous Every 48 hours 02/17/15 0436     02/17/15 1700  vancomycin (VANCOCIN) IVPB 750 mg/150 ml premix    Comments:  Patient started Vancomycin on 6/23.   750 mg 150 mL/hr over 60 Minutes Intravenous Every 18 hours 02/17/15 0438      02/17/15 0830  aztreonam (AZACTAM) 2 g in dextrose 5 % 50 mL IVPB     2 g 100 mL/hr over 30 Minutes Intravenous Every 8 hours 02/17/15 0436     02/16/15 2200  levofloxacin (LEVAQUIN) IVPB 750 mg     750 mg 100 mL/hr over 90 Minutes Intravenous  Once 02/16/15 2159 02/17/15 0049   02/16/15 2200  aztreonam (AZACTAM) 2 g in dextrose 5 % 50 mL IVPB     2 g 100 mL/hr over 30 Minutes Intravenous  Once 02/16/15 2159 02/17/15 0100   02/16/15 2200  vancomycin (VANCOCIN) IVPB 1000 mg/200 mL premix     1,000 mg 200 mL/hr over 60 Minutes Intravenous  Once 02/16/15 2159 02/17/15 0012     Assessment: 81 yo female presenting to ED with fever and hypoxia.  Pharmacy consulted for dosing of Vancomycin, Levaquin, and Azactam.    Goal of Therapy:  Vancomycin trough level 15-20 mcg/ml  Plan:  Ordered Vancomycin 1g IV in the ED, followed by Vancomycin 750mg  IV Q18H to begin 6/24 at 17:00.  Trough level ordered for 6/26 at 22:30.    Ordered Azactam 2g IV Q8H and Levaquin 750mg  IV Q48H.   Follow up culture results.    Pharmacy to follow and adjust as per consult.     Stormy Card, [facility] Clinical Pharmacist  02/17/2015 

## 2015-02-17 NOTE — Plan of Care (Signed)
Problem: Discharge Progression Outcomes Goal: Able to self administer respiratory meds Outcome: Not Met (add Reason) Meds administered by RN at [place]

## 2015-02-17 NOTE — Progress Notes (Signed)
New admit from the ED, pt arrived from the ED, was taken off Bipap, placed on n/c by respiratory.  Satting 99%, unlabored.  BP upon arrival 129/74, asymptomatic.  Pt is nonverbal at this point, does not answer any questions.  Will continue to monitor.

## 2015-02-17 NOTE — Plan of Care (Signed)
Problem: Discharge Progression Outcomes Goal: Other Discharge Outcomes/Goals Outcome: Progressing Patient arrived from CCU in afternoon   O2 sats of 98 to 100% on 2 liters was on 4L when she came down  No c/o dyspnea   Pt c/o  Slight right hip pain x1 but it was relieved with repositioning   Patent is tolerating diet well, pills crushed with apple sauce but could take very small pills whole with apple sauce   VSS, family at beside

## 2015-02-17 NOTE — Clinical Social Work Note (Signed)
Clinical Social Work Assessment  Patient Details  Name: [name] MRN: [id number] Date of Birth: 07/30/1923  Date of referral:  02/17/15               Reason for consult:  Facility Placement                Permission sought to share information with:  Family Supports Permission granted to share information::  Yes, Verbal Permission Granted  Name::      (W.S. Gladwell, husband )   Housing/Transportation Living arrangements for the past 2 months:  Skilled Nursing Facility Source of Information:  Spouse Patient Interpreter Needed:  None Criminal Activity/Legal Involvement Pertinent to Current Situation/Hospitalization:  No - Comment as needed Significant Relationships:  Spouse Lives with:  Facility Resident Do you feel safe going back to the place where you live?  Yes Need for family participation in patient care:  Yes (Comment)  Care giving concerns:  None identified. PT has caregivers for most of the day.    Social Worker assessment / plan:  CSW met with pt and husband to address consult. Pt was being fed by her caregiver. CSW introduced herself and explained role of social work. CSW also explained process of returning to SNF. Pt is from [facility]. Pt is able to return. FL2 is on the chart for MD's signature. CSW will continue to follow for discharge planning needs.    Employment status:  Retired Insurance information:  Medicare PT Recommendations:  Not assessed at this time Information / Referral to community resources:  Other (Comment Required) (facility resident)  Patient/Family's Response to care:  Pt's husband is supportive and would like to have pt stable prior to discharging to SNF.   Patient/Family's Understanding of and Emotional Response to Diagnosis, Current Treatment, and Prognosis:  Pt's husband understands that needs to return to SNF at discharge.   Emotional Assessment Appearance:  Appears older than stated age Attitude/Demeanor/Rapport:  Other  (pt was eating at time of assessment) Affect (typically observed):  Pleasant Orientation:  Oriented to Self, Oriented to Place, Oriented to  Time, Oriented to Situation Alcohol / Substance use:  Never Used Psych involvement (Current and /or in the community):  No (Comment)  Discharge Needs  Concerns to be addressed:  No discharge needs identified Readmission within the last 30 days:  No Current discharge risk:  None Barriers to Discharge:  No Barriers Identified    , LCSW 02/17/2015, 3:18 PM  

## 2015-02-17 NOTE — ED Notes (Signed)
Private sitter at bedside.

## 2015-02-17 NOTE — Progress Notes (Signed)
Called Dr. Clent Ridges in regards to positive blood cultures. Huntington Leverich, RN 1:50 PM

## 2015-02-18 LAB — CREATININE, SERUM
Creatinine, Ser: 0.85 mg/dL (ref 0.44–1.00)
GFR calc Af Amer: >60 mL/min (ref >60)
GFR, EST NON AFRICAN AMERICAN: 59 mL/min — AB (ref >60)

## 2015-02-18 LAB — CBC
HCT: 27.9 % — ABNORMAL LOW (ref 35.0–47.0)
HEMOGLOBIN: 9.2 g/dL — AB (ref 12.0–16.0)
MCH: 31.7 pg (ref 26.0–34.0)
MCHC: 32.9 g/dL (ref 32.0–36.0)
MCV: 96.4 fL (ref 80.0–100.0)
PLATELETS: 110 10*3/uL — AB (ref 150–440)
RBC: 2.89 MIL/uL — AB (ref 3.80–5.20)
RDW: 16.4 % — ABNORMAL HIGH (ref 11.5–14.5)
WBC: 7.5 10*3/uL (ref 3.6–11.0)

## 2015-02-18 LAB — PROTIME-INR
INR: 1.4
Prothrombin Time: 17.4 seconds — ABNORMAL HIGH (ref 11.4–15.0)

## 2015-02-18 MED ORDER — SODIUM CHLORIDE 0.9 % IJ SOLN: 3.0000 mL | INTRAMUSCULAR | Status: DC | PRN

## 2015-02-18 MED ORDER — SIMETHICONE 40 MG/0.6ML PO SUSP
40.0000 mg | Freq: Four times a day (QID) | ORAL | Status: DC | PRN
  Administered 2015-02-18: 40 mg via ORAL
  Filled 2015-02-18 (×2): qty 0.6

## 2015-02-18 MED ORDER — SODIUM CHLORIDE 0.9 % IJ SOLN
3.0000 mL | Freq: Two times a day (BID) | INTRAMUSCULAR | Status: DC
  Administered 2015-02-18 – 2015-02-21 (×8): 3 mL via INTRAVENOUS

## 2015-02-18 MED ORDER — ACETAMINOPHEN 325 MG PO TABS: 650.0000 mg | ORAL_TABLET | Freq: Four times a day (QID) | ORAL | Status: DC | PRN

## 2015-02-18 MED ORDER — HYDROCODONE-ACETAMINOPHEN 5-325 MG PO TABS
1.0000 | ORAL_TABLET | Freq: Four times a day (QID) | ORAL | Status: DC | PRN
  Administered 2015-02-18 – 2015-02-19 (×3): 1 via ORAL
  Filled 2015-02-18 (×3): qty 1

## 2015-02-18 NOTE — Progress Notes (Signed)
SLP Progress Note  Patient Details Name: [name] MRN: [id number] DOB: May 20, 1923   Reviewed chart and spoke w/nsg and pt/family. No reports of any difficulty tolerating Dysphagia I diet w/thin liquids. Recommend continue w/Dysphagia I diet w/thin (NO STRAWS) and aspiration precautions. Will f/u in 1-3 days.        Williamson,Crockett Rallo 02/18/2015, 11:49 AM

## 2015-02-18 NOTE — Progress Notes (Signed)
PHARMACIST - PHYSICIAN ORDER COMMUNICATION  CONCERNING: P&T Medication Policy on Herbal Medications  DESCRIPTION:  This patient's order for:  biotin  has been noted.  This product(s) is classified as an "herbal" or natural product. Due to a lack of definitive safety studies or FDA approval, nonstandard manufacturing practices, plus the potential risk of unknown drug-drug interactions while on inpatient medications, the Pharmacy and Therapeutics Committee does not permit the use of "herbal" or natural products of this type within [facility].   ACTION TAKEN: The pharmacy department has discontinued this order at this time. Please reevaluate patient's clinical condition at discharge and address if the herbal or natural product(s) should be resumed at that time.

## 2015-02-18 NOTE — Progress Notes (Signed)
Patient complaining of generalized 10/10 stomach pain, with some pressure. Zofran given due to possible nausea with no improvement. Dr. Anne Hahn notified, will put in orders.

## 2015-02-18 NOTE — Plan of Care (Signed)
Problem: Phase II Progression Outcomes Goal: O2 sats > equal to 90% on RA or at baseline Outcome: Progressing O2 sats: Patient awake and alert all day. Confused as to situation. O2 at 2L [place] keeping sats in high 90's. Lung sounds diminished.  Pain: No complaints of pain today. Dyspnea: Patient gets short of breath on exertion. ADL: Patient needs full assistance to perform ADLs. Foley in place draining yellow urine. Activity: Patient remained in bed today. Patient had bowel movement this morning. Diet: Tolerating diet without problems - eating well with family help. Hemodynamics: Vital signs stable.

## 2015-02-18 NOTE — Progress Notes (Signed)
Picked up patient at 1600. No c/o pain. Family sitter at the bedside. No acute changes.

## 2015-02-18 NOTE — Plan of Care (Signed)
Problem: Discharge Progression Outcomes Goal: Other Discharge Outcomes/Goals Outcome: Progressing Picked up this patient at 2300. Patient is alert to self. No c/o pain at this time, VSS. Sitter at bedside. Continues on 2 L oxygen.

## 2015-02-18 NOTE — Progress Notes (Addendum)
[facility] [place] [facility]   PATIENT NAME: [name]    MR#:  [id number]  DATE OF BIRTH:  [birth year]  SUBJECTIVE:  CHIEF COMPLAINT:   Chief Complaint  Patient presents with  . Shortness of Breath   Doing well. Alert, smiling with husband and caregivers. Not oriented. Able to tell me that she is not hurting anywhere, not able to provide much history. No review of systems,  husband is present at the bedside. Blood cultures revealed 4 out of 4 growth of gram-positive cocci. Urine cultures are negative so far. Patient is on the levofloxacin and vancomycin for now. Remains afebrile, vital signs adequate  REVIEW OF SYSTEMS:   ROS Unable to perform due to dementia  DRUG ALLERGIES:   Allergies  Allergen Reactions  . Codeine Phosphate     REACTION: myalgia  . Penicillins     REACTION: questionable    VITALS:  Blood pressure 111/66, pulse 77, temperature 98.2 F (36.8 C), temperature source Oral, resp. rate 18, height  (1.626 m), weight 76.613 kg (168 lb 14.4 oz), SpO2 100 %.  PHYSICAL EXAMINATION:  GENERAL:  81 y.o.-year-old patient lying in the bed with no acute distress. Somnolent, intermittently EYES: Pupils equal, round, reactive to light and accommodation. No scleral icterus. Extraocular muscles intact.  HEENT: Head atraumatic, normocephalic. Oropharynx and nasopharynx clear. MMdry NECK:  Supple, no jugular venous distention. No thyroid enlargement, no tenderness.  LUNGS: Normal breath sounds bilaterally, no wheezing, rales, rhonchi or crepitation. No use of accessory muscles of respiration. nasal canula . Diminished breath sounds bilaterally at bases. Poor effort CARDIOVASCULAR: S1, S2 normal . Irregularly irregular. No murmurs, rubs, or gallops.  ABDOMEN: Soft, nontender, nondistended. Bowel sounds present. No organomegaly or mass.  EXTREMITIES: No pedal edema, cyanosis, or clubbing. cold NEUROLOGIC: Cranial nerves II through XII  are intact. Moves extremities spontaneously PSYCHIATRIC: The patient is alert and calm SKIN: No obvious rash, lesion, or ulcer.    LABORATORY PANEL:   CBC  Recent Labs Lab 02/18/15 0309  WBC 7.5  HGB 9.2*  HCT 27.9*  PLT 110*   ------------------------------------------------------------------------------------------------------------------  Chemistries   Recent Labs Lab 02/16/15 2159 02/18/15 0309  NA 134*  --   K 4.6  --   CL 94*  --   CO2 29  --   GLUCOSE 141*  --   BUN 35*  --   CREATININE 1.00 0.85  CALCIUM 8.5*  --   AST 26  --   ALT 11*  --   ALKPHOS 195*  --   BILITOT 1.1  --    ------------------------------------------------------------------------------------------------------------------  Cardiac Enzymes  Recent Labs Lab 02/16/15 2159  TROPONINI 0.03   ------------------------------------------------------------------------------------------------------------------  RADIOLOGY:  Ct Head Wo Contrast  02/17/2015   CLINICAL DATA:  Nursing home patient, difficulty breathing, febrile and hypoxia. Altered mental status for 1 day. History of dementia, chronic renal disease, hypertension.  EXAM: CT HEAD WITHOUT CONTRAST  TECHNIQUE: Contiguous axial images were obtained from the base of the skull through the vertex without intravenous contrast.  COMPARISON:  CT head September 28, 2011  FINDINGS: Mildly motion degraded examination.  The ventricles and sulci are normal for age. No intraparenchymal hemorrhage, mass effect nor midline shift. Patchy supratentorial white matter hypodensities are within normal range for patient's age and though non-specific suggest sequelae of chronic small vessel ischemic disease. No acute large vascular territory infarcts.  No abnormal extra-axial fluid collections. Basal cisterns are patent. Moderate calcific atherosclerosis of the  carotid siphons.  No skull fracture. The included ocular globes and orbital contents are non-suspicious.  Bilateral ocular lens implants. LEFT middle ear and mastoid effusion, moderate RIGHT mastoid effusion without air cell coalescence. Paranasal sinuses are well-aerated. Moderate temporomandibular osteoarthrosis.  IMPRESSION: No acute intracranial process.  LEFT middle ear and M bilateral mastoid effusions.  Stable chronic changes including moderate white matter changes compatible chronic small vessel ischemic disease.   Electronically Signed   By: Awilda Metro M.D.   On: 02/17/2015 00:56   Dg Chest Port 1 View  02/16/2015   CLINICAL DATA:  Acute onset of difficulty breathing. Fever and decreased O2 saturation. Initial encounter.  EXAM: PORTABLE CHEST - 1 VIEW  COMPARISON:  Chest radiograph performed 02/28/2014  FINDINGS: The lungs are well-aerated. A small right pleural effusion is noted. Underlying vascular congestion is seen. Increased interstitial markings raise concern for pulmonary edema. No pneumothorax is identified.  The cardiomediastinal silhouette is enlarged. The patient is status post median sternotomy. No acute osseous abnormalities are seen.  IMPRESSION: Small right pleural effusion noted. Underlying vascular congestion and cardiomegaly. Increased interstitial markings raise concern for pulmonary edema.   Electronically Signed   By: Roanna Raider M.D.   On: 02/16/2015 22:39    EKG:   Orders placed or performed during the hospital encounter of 02/16/15  . ED EKG 12-Lead  . ED EKG 12-Lead  . EKG 12-Lead  . EKG 12-Lead    ASSESSMENT AND PLAN:    This is an 81 year old Caucasian female admitted for sepsis.  1. Sepsis due to gram-positive cocci of unclear etiology at this time, ID pending: Improving.  Repeat blood cultures today - . Unclear if related to pyuria, questionable  UTI - culture ID pending - blood pressure stable, no pressors, now alert and stable.  - continue  vanc and levaquin  2. UTI, cultures are negative so far, suspected sterile pyuria:  - as above - foley  in place, remove when able  3. Acute Respiratory failure with hypoxia: resolved - briefly on BiPap  4. Hypotension:  - resolved with IVF, now off IV fluids  5. Chronic CHF: Diastolic.  - no current exacerbation - Preserved ejection fraction, continue Lasix.  6. Coronary artery disease: Stable continue secondary prevention  7. DVT prophylaxis: coumadin per pharmacy  8. GI prophylaxis: None 9 . Chronic atrial fibrillation on Coumadin therapy at home. Continue Coumadin. Heart rate seemed to be well controlled on metoprolol The patient is DO NOT RESUSCITATE.   All the records are reviewed and case discussed with Care Management/Social Workerr. Management plans discussed with the patient, family and they are in agreement.  TOTAL TIME TAKING CARE OF THIS PATIENT: 45 minutes.  Prolonged discussion with caregiver, as well as patient's husband in regards to medication management. All questions were answered , husband was updated about patient's condition and discharge planning discussed for approximately 10 to 15 minutes POSSIBLE D/C IN 2-3 DAYS, DEPENDING ON CLINICAL CONDITION.  Greater than 50% of time spent coordinating care and counseling daughter/care givers.  Katharina Caper M.D on 02/18/2015 at 8:31 AM  Between 7am to 6pm - Pager - [phone]  After 6pm go to [email or web address] - password [email or web address]  [facility]  Office  [phone]  CC: Primary care physician; Pcp Not In System

## 2015-02-18 NOTE — Progress Notes (Signed)
ANTICOAGULATION CONSULT NOTE - Initial Consult  Pharmacy Consult for Warfarin Indication: atrial fibrillation  Allergies  Allergen Reactions  . Codeine Phosphate     REACTION: myalgia  . Penicillins     REACTION: questionable    Patient Measurements: Height: 5\' 4"  (162.6 cm) Weight: 168 lb 14.4 oz (76.613 kg) IBW/kg (Calculated) : 54.7   Vital Signs: Temp: 98.2 F (36.8 C) (06/25 0454) Temp Source: Oral (06/25 0454) BP: 111/66 mmHg (06/25 0454) Pulse Rate: 77 (06/25 0454)  Labs:  Recent Labs  02/16/15 2159 02/18/15 0309  HGB 9.8* 9.2*  HCT 30.7* 27.9*  PLT 142* 110*  APTT 48*  --   LABPROT 16.3* 17.4*  INR 1.29 1.40  CREATININE 1.00 0.85  TROPONINI 0.03  --     Estimated Creatinine Clearance: 45 mL/min (by C-G formula based on Cr of 0.85).   Medical History: Past Medical History  Diagnosis Date  . Coronary artery disease     s/p CABG 1998 and s/p stents placed in the right coronary artery in 01/2008  . Atrial fibrillation   . Carotid stenosis   . Unspecified diastolic heart failure   . Hypertension   . Hyperlipidemia   . Pulmonary hypertension   . Renal artery stenosis     bilateral  . Chronic renal insufficiency   . Diverticular disease   . IBS (irritable bowel syndrome)   . Anemia   . Endometriosis   . Hypothermia     Medications:  Scheduled:  . acidophilus  1 capsule Oral Daily  . aspirin EC  81 mg Oral Daily  . benazepril  10 mg Oral BID  . Biotin  1 tablet Oral Daily  . docusate sodium  200 mg Oral Daily  . DULoxetine  30 mg Oral Daily  . heparin  5,000 Units Subcutaneous 3 times per day  . levofloxacin (LEVAQUIN) IV  750 mg Intravenous Q48H  . levothyroxine  50 mcg Oral Daily  . LORazepam  0.25 mg Oral BID WC  . LORazepam  0.5 mg Oral QPM  . metoprolol tartrate  25 mg Oral BID  . pravastatin  40 mg Oral Daily  . torsemide  10 mg Oral QODAY  . traZODone  50 mg Oral QHS  . vancomycin  750 mg Intravenous Q18H  . warfarin  2.5 mg  Oral Daily  . Warfarin - Pharmacist Dosing Inpatient   Does not apply q1800    Assessment: 81 yo female on Warfarin for Atrial Fibrillaton. Home dose is 2.5mg  daily.  Goal of Therapy:  INR 2-3 Monitor platelets by anticoagulation protocol: Yes   Plan:  6/25 INR = 1.4. Will continue current dose of Warfarin 2.5 mg daily at 1100. INR ordered for am.  Patient on Levaquin,Vancomycin.  Patient on Heparin subcutaneous q8h currently.  Odie Sera, RPh Clinical Pharmacist 02/18/2015

## 2015-02-19 ENCOUNTER — Inpatient Hospital Stay (HOSPITAL_COMMUNITY)
Admit: 2015-02-19 | Discharge: 2015-02-19 | Disposition: A | Discharge status: Home / Self Care | Payer: Medicare Other | Attending: Internal Medicine | Admitting: Internal Medicine

## 2015-02-19 DIAGNOSIS — I340 Nonrheumatic mitral (valve) insufficiency: Secondary | ICD-10-CM

## 2015-02-19 LAB — CULTURE, BLOOD (ROUTINE X 2)

## 2015-02-19 LAB — PROTIME-INR
INR: 1.34
Prothrombin Time: 16.8 seconds — ABNORMAL HIGH (ref 11.4–15.0)

## 2015-02-19 LAB — VANCOMYCIN, TROUGH: Vancomycin Tr: 17 ug/mL (ref 10–20)

## 2015-02-19 LAB — CBC
HCT: 29.8 % — ABNORMAL LOW (ref 35.0–47.0)
Hemoglobin: 9.8 g/dL — ABNORMAL LOW (ref 12.0–16.0)
MCH: 31.5 pg (ref 26.0–34.0)
MCHC: 32.9 g/dL (ref 32.0–36.0)
MCV: 95.7 fL (ref 80.0–100.0)
Platelets: 133 10*3/uL — ABNORMAL LOW (ref 150–440)
RBC: 3.11 MIL/uL — ABNORMAL LOW (ref 3.80–5.20)
RDW: 16.4 % — ABNORMAL HIGH (ref 11.5–14.5)
WBC: 7.8 10*3/uL (ref 3.6–11.0)

## 2015-02-19 LAB — CREATININE, SERUM
Creatinine, Ser: 0.73 mg/dL (ref 0.44–1.00)
GFR calc Af Amer: >60 mL/min (ref >60)

## 2015-02-19 MED ORDER — NON FORMULARY: 1.0000 | Freq: Every day | Status: DC

## 2015-02-19 MED ORDER — WARFARIN SODIUM 3 MG PO TABS
3.0000 mg | ORAL_TABLET | Freq: Every day | ORAL | Status: DC
  Administered 2015-02-19 – 2015-02-22 (×3): 3 mg via ORAL
  Filled 2015-02-19 (×4): qty 1

## 2015-02-19 MED ORDER — METOCLOPRAMIDE HCL 5 MG/ML IJ SOLN
10.0000 mg | Freq: Once | INTRAMUSCULAR | Status: AC
  Administered 2015-02-19: 10 mg via INTRAVENOUS
  Filled 2015-02-19: qty 2

## 2015-02-19 MED ORDER — ALIGN 4 MG PO CAPS
4.0000 mg | ORAL_CAPSULE | Freq: Every day | ORAL | Status: DC
  Administered 2015-02-19 – 2015-02-22 (×3): 4 mg via ORAL

## 2015-02-19 NOTE — Plan of Care (Signed)
Problem: Discharge Progression Outcomes Goal: Other Discharge Outcomes/Goals Outcome: Progressing Patient is alert to self and location. C/o pain and nausea in abdomen--Norco given, Zofran and Reglan with little relief. Prune juice was given by suggestion of sitter, resulting in no pain in the abdomen. Sitter remains at bedside.

## 2015-02-19 NOTE — Progress Notes (Signed)
*  PRELIMINARY RESULTS* Echocardiogram 2D Echocardiogram has been performed.  Garrel Ridgel Stills 02/19/2015, 8:13 AM

## 2015-02-19 NOTE — Consult Note (Signed)
ANTIBIOTIC CONSULT NOTE - Follow up  Pharmacy Consult for Vancomycin, Levaquin Indication: Sepsis/UTI  Allergies  Allergen Reactions  . Codeine Phosphate     REACTION: myalgia  . Penicillins     REACTION: questionable    Patient Measurements: Height: 5\' 4"  (162.6 cm) Weight: 177 lb 14.4 oz (80.695 kg) IBW/kg (Calculated) : 54.7 Adjusted Body Weight: 65.6kg  Vital Signs: Temp: 98.3 F (36.8 C) (06/26 1242) Temp Source: Oral (06/26 1242) BP: 146/88 mmHg (06/26 1242) Pulse Rate: 94 (06/26 1242) Intake/Output from previous day: 06/25 0701 - 06/26 0700 In: 360 [P.O.:360] Out: 951 [Urine:950; Stool:1] Intake/Output from this shift: Total I/O In: 360 [P.O.:360] Out: 300 [Urine:300]  Labs:  Recent Labs  02/16/15 2159 02/18/15 0309 02/19/15 0439  WBC 11.7* 7.5 7.8  HGB 9.8* 9.2* 9.8*  PLT 142* 110* 133*  CREATININE 1.00 0.85 0.73   Estimated Creatinine Clearance: 49 mL/min (by C-G formula based on Cr of 0.73).  Microbiology: Recent Results (from the past 720 hour(s))  MRSA PCR Screening     Status: None   Collection Time: 02/16/15  9:37 PM  Result Value Ref Range Status   MRSA by PCR NEGATIVE NEGATIVE Final    Comment:        The GeneXpert MRSA Assay (FDA approved for NASAL specimens only), is one component of a comprehensive MRSA colonization surveillance program. It is not intended to diagnose MRSA infection nor to guide or monitor treatment for MRSA infections.   Blood Culture (routine x 2)     Status: None   Collection Time: 02/16/15 10:00 PM  Result Value Ref Range Status   Specimen Description BLOOD  Final   Special Requests NONE  Final   Culture  Setup Time   Final    GRAM POSITIVE COCCI IN BOTH AEROBIC AND ANAEROBIC BOTTLES CRITICAL RESULT CALLED TO, READ BACK BY AND VERIFIED WITH: BRITTNEY KILLINGSWORTH ON 02/17/15 AT 1325 BY JEF    Culture   Final    ENTEROCOCCUS FAECALIS IN BOTH AEROBIC AND ANAEROBIC BOTTLES    Report Status 02/19/2015  FINAL  Final   Organism ID, Bacteria ENTEROCOCCUS FAECALIS  Final      Susceptibility   Enterococcus faecalis - MIC*    AMPICILLIN <=2 SENSITIVE Sensitive     LINEZOLID 2 SENSITIVE Sensitive     * ENTEROCOCCUS FAECALIS  Urine culture     Status: None (Preliminary result)   Collection Time: 02/16/15 10:35 PM  Result Value Ref Range Status   Specimen Description URINE, CLEAN CATCH  Final   Special Requests NONE  Final   Culture   Final    60,000 COLONIES/ml GROUP B STREP(S.AGALACTIAE)ISOLATED Virtually 100% of S. agalactiae (Group B) strains are susceptible to Penicillin.  For Penicillin-allergic patients, Erythromycin (85-95% sensitive) and Clindamycin (80% sensitive) are drugs of choice. Contact microbiology lab to request sensitivities if  needed within 7 days.    Report Status PENDING  Incomplete  Blood Culture (routine x 2)     Status: None   Collection Time: 02/16/15 10:55 PM  Result Value Ref Range Status   Specimen Description BLOOD  Final   Special Requests NONE  Final   Culture  Setup Time   Final    GRAM POSITIVE COCCI IN BOTH AEROBIC AND ANAEROBIC BOTTLES CRITICAL RESULT CALLED TO, READ BACK BY AND VERIFIED WITH: BRITTNEY KILLINGSWORTH ON 02/17/15 AT 1325 BY JEF    Culture   Final    ENTEROCOCCUS FAECALIS IN BOTH AEROBIC AND ANAEROBIC BOTTLES  Report Status 02/19/2015 FINAL  Final   Organism ID, Bacteria ENTEROCOCCUS FAECALIS  Final      Susceptibility   Enterococcus faecalis - MIC*    AMPICILLIN <=2 SENSITIVE Sensitive     LINEZOLID 2 SENSITIVE Sensitive     * ENTEROCOCCUS FAECALIS  Culture, blood (routine x 2)     Status: None (Preliminary result)   Collection Time: 02/18/15  8:57 AM  Result Value Ref Range Status   Specimen Description BLOOD  Final   Special Requests LEFT ANTECUBITAL  Final   Culture NO GROWTH < 24 HOURS  Final   Report Status PENDING  Incomplete  Culture, blood (routine x 2)     Status: None (Preliminary result)   Collection Time:  02/18/15  8:57 AM  Result Value Ref Range Status   Specimen Description BLOOD  Final   Special Requests RIGHT ANTECUBITAL  Final   Culture NO GROWTH < 24 HOURS  Final   Report Status PENDING  Incomplete    Medical History: Past Medical History  Diagnosis Date  . Coronary artery disease     s/p CABG 1998 and s/p stents placed in the right coronary artery in 01/2008  . Atrial fibrillation   . Carotid stenosis   . Unspecified diastolic heart failure   . Hypertension   . Hyperlipidemia   . Pulmonary hypertension   . Renal artery stenosis     bilateral  . Chronic renal insufficiency   . Diverticular disease   . IBS (irritable bowel syndrome)   . Anemia   . Endometriosis   . Hypothermia     Medications:  Anti-infectives    Start     Dose/Rate Route Frequency Ordered Stop   02/18/15 1800  levofloxacin (LEVAQUIN) IVPB 750 mg  Status:  Discontinued    Comments:  Patient started Levaquin on 6/23.   750 mg 100 mL/hr over 90 Minutes Intravenous Every 48 hours 02/17/15 0436 02/19/15 0840   02/17/15 1700  vancomycin (VANCOCIN) IVPB 750 mg/150 ml premix    Comments:  Patient started Vancomycin on 6/23.   750 mg 150 mL/hr over 60 Minutes Intravenous Every 18 hours 02/17/15 0438     02/17/15 0830  aztreonam (AZACTAM) 2 g in dextrose 5 % 50 mL IVPB  Status:  Discontinued     2 g 100 mL/hr over 30 Minutes Intravenous Every 8 hours 02/17/15 0436 02/17/15 1443   02/16/15 2200  levofloxacin (LEVAQUIN) IVPB 750 mg     750 mg 100 mL/hr over 90 Minutes Intravenous  Once 02/16/15 2159 02/17/15 0049   02/16/15 2200  aztreonam (AZACTAM) 2 g in dextrose 5 % 50 mL IVPB     2 g 100 mL/hr over 30 Minutes Intravenous  Once 02/16/15 2159 02/17/15 0100   02/16/15 2200  vancomycin (VANCOCIN) IVPB 1000 mg/200 mL premix     1,000 mg 200 mL/hr over 60 Minutes Intravenous  Once 02/16/15 2159 02/17/15 0012     Assessment: 81 yo female presenting to ED with fever and hypoxia.  Pharmacy consulted for  dosing of Vancomycin, Levaquin. +Bcx  Goal of Therapy:  Vancomycin trough level 15-20 mcg/ml  Plan:  Patient on Vancomycin 750mg  IV Q18H to begin 6/24 at 17:00.  Trough level ordered for 6/26 at 22:30.  Patient on Levaquin 750mg  IV Q48H.  Bcx: + GPC= Enterococcus- sensitive to Ampicillin (see ID note 6/26). (penicillin allergy listed)  Pharmacy to follow and adjust as per consult.     Bari Mantis PharmD Clinical  Pharmacist 02/19/2015

## 2015-02-19 NOTE — Progress Notes (Signed)
Patient still complaining of abdominal pain, now mixed with nausea. No SOB, no chest pain. Dr. Anne Hahn notified, put in orders for Reglan.

## 2015-02-19 NOTE — Consult Note (Signed)
[facility] Infectious Disease     Reason for Consult: Enterococcal bacteremia    Referring Physician: Enriqueta Shutter Date of Admission:  02/16/2015   Active Problems:   Sepsis   HPI: [name] is a 81 y.o. female with history of dementia, CKD, A fib, CAD admitted 6/24 with sepsis.  On admission had fever to 103.3, wbc 11.7 and  She had + UA with TNTC WBC, UCX with GB Strep and cx with Enterococcus.   Has been treated with vanco and levofloxacin. And had defervesced.   Past Medical History  Diagnosis Date  . Coronary artery disease     s/p CABG 1998 and s/p stents placed in the right coronary artery in 01/2008  . Atrial fibrillation   . Carotid stenosis   . Unspecified diastolic heart failure   . Hypertension   . Hyperlipidemia   . Pulmonary hypertension   . Renal artery stenosis     bilateral  . Chronic renal insufficiency   . Diverticular disease   . IBS (irritable bowel syndrome)   . Anemia   . Endometriosis   . Hypothermia    Past Surgical History  Procedure Laterality Date  . Coronary artery bypass graft  1998  . Total hip arthroplasty  1995    right  . Abdominal hysterectomy    . Ovarian cyst removal    . Coronary angioplasty with stent placement    . Renal artery stent  06/2003    right   History  Substance Use Topics  . Smoking status: Never Smoker   . Smokeless tobacco: Never Used  . Alcohol Use: Yes     Comment: oocassionally   History reviewed. No pertinent family history.  Allergies:  Allergies  Allergen Reactions  . Codeine Phosphate     REACTION: myalgia  . Penicillins     REACTION: questionable    Current antibiotics: Antibiotics Given (last 72 hours)    Date/Time Action Medication Dose Rate   02/17/15 0821 Given   aztreonam (AZACTAM) 2 g in dextrose 5 % 50 mL IVPB 2 g 100 mL/hr   02/17/15 1945 Given   vancomycin (VANCOCIN) IVPB 750 mg/150 ml premix 750 mg 150 mL/hr   02/18/15 1159 Given   vancomycin (VANCOCIN) IVPB 750  mg/150 ml premix 750 mg 150 mL/hr   02/18/15 1709 Given   levofloxacin (LEVAQUIN) IVPB 750 mg 750 mg 100 mL/hr   02/19/15 0458 Given   vancomycin (VANCOCIN) IVPB 750 mg/150 ml premix 750 mg 150 mL/hr      MEDICATIONS: . acidophilus  1 capsule Oral Daily  . aspirin EC  81 mg Oral Daily  . docusate sodium  200 mg Oral Daily  . DULoxetine  30 mg Oral Daily  . levothyroxine  50 mcg Oral Daily  . LORazepam  0.25 mg Oral BID WC  . LORazepam  0.5 mg Oral QPM  . metoprolol tartrate  25 mg Oral BID  . pravastatin  40 mg Oral Daily  . sodium chloride  3 mL Intravenous Q12H  . torsemide  10 mg Oral QODAY  . traZODone  50 mg Oral QHS  . vancomycin  750 mg Intravenous Q18H  . warfarin  3 mg Oral Daily  . Warfarin - Pharmacist Dosing Inpatient   Does not apply q1800    Review of Systems - 11 systems reviewed and negative per HPI   OBJECTIVE: Temp:  [97.3 F (36.3 C)-98.7 F (37.1 C)] 98.3 F (36.8 C) (06/26 1242) Pulse Rate:  [  83-94] 94 (06/26 1242) Resp:  [18-20] 18 (06/26 1242) BP: (142-155)/(70-88) 146/88 mmHg (06/26 1242) SpO2:  [90 %-100 %] 98 % (06/26 1242) Weight:  [80.695 kg (177 lb 14.4 oz)] 80.695 kg (177 lb 14.4 oz) (06/26 0404) Physical Exam  Constitutional:  Frail elderly, very quiet  HENT: [place]/AT, PERRLA, no scleral icterus Mouth/Throat: Oropharynx is clear and dry . No oropharyngeal exudate.  Cardiovascular: reg 2/6 sm Pulmonary/Chest: Effort normal and breath sounds normal. No respiratory distress.  has no wheezes.  Neck  supple, no nuchal rigidity Abdominal: Soft. Bowel sounds are normal.  exhibits no distension. Mild diffuse  tenderness.  Lymphadenopathy: no cervical adenopathy. No axillary adenopathy Neurological: alert and interactive  Skin: Skin is warm and dry. LLE with 2 large eschar which I removed - there is a clean based ulcer under each with no evidence purlence of erythema GU foley in place Psychiatric: a normal mood and affect.  behavior is normal.     LABS: Results for orders placed or performed during the hospital encounter of 02/16/15 (from the past 48 hour(s))  CBC     Status: Abnormal   Collection Time: 02/18/15  3:09 AM  Result Value Ref Range   WBC 7.5 3.6 - 11.0 K/uL   RBC 2.89 (L) 3.80 - 5.20 MIL/uL   Hemoglobin 9.2 (L) 12.0 - 16.0 g/dL   HCT 27.9 (L) 35.0 - 47.0 %   MCV 96.4 80.0 - 100.0 fL   MCH 31.7 26.0 - 34.0 pg   MCHC 32.9 32.0 - 36.0 g/dL   RDW 16.4 (H) 11.5 - 14.5 %   Platelets 110 (L) 150 - 440 K/uL  Protime-INR     Status: Abnormal   Collection Time: 02/18/15  3:09 AM  Result Value Ref Range   Prothrombin Time 17.4 (H) 11.4 - 15.0 seconds   INR 1.40   Creatinine, serum     Status: Abnormal   Collection Time: 02/18/15  3:09 AM  Result Value Ref Range   Creatinine, Ser 0.85 0.44 - 1.00 mg/dL   GFR calc non Af Amer 59 (L) >60 mL/min   GFR calc Af Amer >60 >60 mL/min    Comment: (NOTE) The eGFR has been calculated using the CKD EPI equation. This calculation has not been validated in all clinical situations. eGFR's persistently <60 mL/min signify possible Chronic Kidney Disease.   Culture, blood (routine x 2)     Status: None (Preliminary result)   Collection Time: 02/18/15  8:57 AM  Result Value Ref Range   Specimen Description BLOOD    Special Requests LEFT ANTECUBITAL    Culture NO GROWTH < 24 HOURS    Report Status PENDING   Culture, blood (routine x 2)     Status: None (Preliminary result)   Collection Time: 02/18/15  8:57 AM  Result Value Ref Range   Specimen Description BLOOD    Special Requests RIGHT ANTECUBITAL    Culture NO GROWTH < 24 HOURS    Report Status PENDING   Protime-INR     Status: Abnormal   Collection Time: 02/19/15  4:39 AM  Result Value Ref Range   Prothrombin Time 16.8 (H) 11.4 - 15.0 seconds   INR 1.34   Creatinine, serum     Status: None   Collection Time: 02/19/15  4:39 AM  Result Value Ref Range   Creatinine, Ser 0.73 0.44 - 1.00 mg/dL   GFR calc non Af Amer >60  >60 mL/min   GFR calc Af Amer >60 >  60 mL/min    Comment: (NOTE) The eGFR has been calculated using the CKD EPI equation. This calculation has not been validated in all clinical situations. eGFR's persistently <60 mL/min signify possible Chronic Kidney Disease.   CBC     Status: Abnormal   Collection Time: 02/19/15  4:39 AM  Result Value Ref Range   WBC 7.8 3.6 - 11.0 K/uL   RBC 3.11 (L) 3.80 - 5.20 MIL/uL   Hemoglobin 9.8 (L) 12.0 - 16.0 g/dL   HCT 29.8 (L) 35.0 - 47.0 %   MCV 95.7 80.0 - 100.0 fL   MCH 31.5 26.0 - 34.0 pg   MCHC 32.9 32.0 - 36.0 g/dL   RDW 16.4 (H) 11.5 - 14.5 %   Platelets 133 (L) 150 - 440 K/uL   No components found for: ESR, C REACTIVE PROTEIN MICRO: Recent Results (from the past 720 hour(s))  MRSA PCR Screening     Status: None   Collection Time: 02/16/15  9:37 PM  Result Value Ref Range Status   MRSA by PCR NEGATIVE NEGATIVE Final    Comment:        The GeneXpert MRSA Assay (FDA approved for NASAL specimens only), is one component of a comprehensive MRSA colonization surveillance program. It is not intended to diagnose MRSA infection nor to guide or monitor treatment for MRSA infections.   Blood Culture (routine x 2)     Status: None   Collection Time: 02/16/15 10:00 PM  Result Value Ref Range Status   Specimen Description BLOOD  Final   Special Requests NONE  Final   Culture  Setup Time   Final    GRAM POSITIVE COCCI IN BOTH AEROBIC AND ANAEROBIC BOTTLES CRITICAL RESULT CALLED TO, READ BACK BY AND VERIFIED WITH: BRITTNEY KILLINGSWORTH ON 02/17/15 AT 24 BY JEF    Culture   Final    ENTEROCOCCUS FAECALIS IN BOTH AEROBIC AND ANAEROBIC BOTTLES    Report Status 02/19/2015 FINAL  Final   Organism ID, Bacteria ENTEROCOCCUS FAECALIS  Final      Susceptibility   Enterococcus faecalis - MIC*    AMPICILLIN <=2 SENSITIVE Sensitive     LINEZOLID 2 SENSITIVE Sensitive     * ENTEROCOCCUS FAECALIS  Urine culture     Status: None (Preliminary  result)   Collection Time: 02/16/15 10:35 PM  Result Value Ref Range Status   Specimen Description URINE, CLEAN CATCH  Final   Special Requests NONE  Final   Culture   Final    60,000 COLONIES/ml GROUP B STREP(S.AGALACTIAE)ISOLATED Virtually 100% of S. agalactiae (Group B) strains are susceptible to Penicillin.  For Penicillin-allergic patients, Erythromycin (85-95% sensitive) and Clindamycin (80% sensitive) are drugs of choice. Contact microbiology lab to request sensitivities if  needed within 7 days.    Report Status PENDING  Incomplete  Blood Culture (routine x 2)     Status: None   Collection Time: 02/16/15 10:55 PM  Result Value Ref Range Status   Specimen Description BLOOD  Final   Special Requests NONE  Final   Culture  Setup Time   Final    GRAM POSITIVE COCCI IN BOTH AEROBIC AND ANAEROBIC BOTTLES CRITICAL RESULT CALLED TO, READ BACK BY AND VERIFIED WITH: BRITTNEY KILLINGSWORTH ON 02/17/15 AT 49 BY JEF    Culture   Final    ENTEROCOCCUS FAECALIS IN BOTH AEROBIC AND ANAEROBIC BOTTLES    Report Status 02/19/2015 FINAL  Final   Organism ID, Bacteria ENTEROCOCCUS FAECALIS  Final  Susceptibility   Enterococcus faecalis - MIC*    AMPICILLIN <=2 SENSITIVE Sensitive     LINEZOLID 2 SENSITIVE Sensitive     * ENTEROCOCCUS FAECALIS  Culture, blood (routine x 2)     Status: None (Preliminary result)   Collection Time: 02/18/15  8:57 AM  Result Value Ref Range Status   Specimen Description BLOOD  Final   Special Requests LEFT ANTECUBITAL  Final   Culture NO GROWTH < 24 HOURS  Final   Report Status PENDING  Incomplete  Culture, blood (routine x 2)     Status: None (Preliminary result)   Collection Time: 02/18/15  8:57 AM  Result Value Ref Range Status   Specimen Description BLOOD  Final   Special Requests RIGHT ANTECUBITAL  Final   Culture NO GROWTH < 24 HOURS  Final   Report Status PENDING  Incomplete    IMAGING: Ct Head Wo Contrast  02/17/2015   CLINICAL DATA:   Nursing home patient, difficulty breathing, febrile and hypoxia. Altered mental status for 1 day. History of dementia, chronic renal disease, hypertension.  EXAM: CT HEAD WITHOUT CONTRAST  TECHNIQUE: Contiguous axial images were obtained from the base of the skull through the vertex without intravenous contrast.  COMPARISON:  CT head September 28, 2011  FINDINGS: Mildly motion degraded examination.  The ventricles and sulci are normal for age. No intraparenchymal hemorrhage, mass effect nor midline shift. Patchy supratentorial white matter hypodensities are within normal range for patient's age and though non-specific suggest sequelae of chronic small vessel ischemic disease. No acute large vascular territory infarcts.  No abnormal extra-axial fluid collections. Basal cisterns are patent. Moderate calcific atherosclerosis of the carotid siphons.  No skull fracture. The included ocular globes and orbital contents are non-suspicious. Bilateral ocular lens implants. LEFT middle ear and mastoid effusion, moderate RIGHT mastoid effusion without air cell coalescence. Paranasal sinuses are well-aerated. Moderate temporomandibular osteoarthrosis.  IMPRESSION: No acute intracranial process.  LEFT middle ear and M bilateral mastoid effusions.  Stable chronic changes including moderate white matter changes compatible chronic small vessel ischemic disease.   Electronically Signed   By: Elon Alas M.D.   On: 02/17/2015 00:56   Dg Chest Port 1 View  02/16/2015   CLINICAL DATA:  Acute onset of difficulty breathing. Fever and decreased O2 saturation. Initial encounter.  EXAM: PORTABLE CHEST - 1 VIEW  COMPARISON:  Chest radiograph performed 02/28/2014  FINDINGS: The lungs are well-aerated. A small right pleural effusion is noted. Underlying vascular congestion is seen. Increased interstitial markings raise concern for pulmonary edema. No pneumothorax is identified.  The cardiomediastinal silhouette is enlarged. The patient  is status post median sternotomy. No acute osseous abnormalities are seen.  IMPRESSION: Small right pleural effusion noted. Underlying vascular congestion and cardiomegaly. Increased interstitial markings raise concern for pulmonary edema.   Electronically Signed   By: Garald Balding M.D.   On: 02/16/2015 22:39    Assessment:   [name] is a 81 y.o. female with history of dementia, CKD, A fib, CAD admitted 6/24 with sepsis.  On admission had fever to 103.3, wbc 11.7 and  She had + UA with TNTC WBC, UCX with GB Strep and cx with Enterococcus.   Has been treated with vanco and levofloxacin and had defervesced.   He LE wounds are very clean and unlikely to be source. Repeat bcx neg. Likely GU source even though ucx did not grow enterococcus she did have TNTC wbc on UA. She does have some IBS  sxs and chronic intermittent abd pain per family so could be GI source as well.   The organism is sensitive to amp, linezolid, levo, cipro and vanco.    Recommendations Check echo Repeat bcx pending - if remains negative and echo negative would give 10 day course of abx from time of neg bcx Consider CT abd if worsens to eval for diverticulitis or other pathology Continue vanco and levo for now but can change to oral levofloxacin at dc to complete course of 10 days from neg cx Thank you very much for allowing me to participate in the care of this patient. Please call with questions.  Cheral Marker. Ola Spurr, MD

## 2015-02-19 NOTE — Progress Notes (Signed)
[facility] - [place] at [facility]   PATIENT NAME: [name]    MR#:  [id number]  DATE OF BIRTH:  [birth year]  SUBJECTIVE:  CHIEF COMPLAINT:   Chief Complaint  Patient presents with  . Shortness of Breath   Doing well, but admits of being tired and not sleeping well at night. Alert, smiling at caregivers. Not oriented.  Blood cultures revealed 4 out of 4 growth of Enterococcus faecalis. Urine cultures positive for 60,000 colony-forming units of group B streptococcus. Patient is on the levofloxacin and vancomycin for now. Remains afebrile, vital signs good  REVIEW OF SYSTEMS:   ROS Unable to perform due to dementia  DRUG ALLERGIES:   Allergies  Allergen Reactions  . Codeine Phosphate     REACTION: myalgia  . Penicillins     REACTION: questionable    VITALS:  Blood pressure 151/82, pulse 83, temperature 97.3 F (36.3 C), temperature source Oral, resp. rate 20, height  (1.626 m), weight 80.695 kg (177 lb 14.4 oz), SpO2 93 %.  PHYSICAL EXAMINATION:  GENERAL:  81 y.o.-year-old patient lying in the bed with no acute distress. Somnolent, intermittently EYES: Pupils equal, round, reactive to light and accommodation. No scleral icterus. Extraocular muscles intact.  HEENT: Head atraumatic, normocephalic. Oropharynx and nasopharynx clear. MMdry NECK:  Supple, no jugular venous distention. No thyroid enlargement, no tenderness.  LUNGS: Normal breath sounds bilaterally, no wheezing, rales, rhonchi or crepitation. No use of accessory muscles of respiration. nasal canula . Diminished breath sounds bilaterally at bases. Poor effort CARDIOVASCULAR: S1, S2 normal . Irregularly irregular. No murmurs, rubs, or gallops.  ABDOMEN: Soft, nontender, nondistended. Bowel sounds present. No organomegaly or mass.  EXTREMITIES: No pedal edema, cyanosis, or clubbing. cold NEUROLOGIC: Cranial nerves II through XII are intact. Moves extremities  spontaneously PSYCHIATRIC: The patient is alert and calm SKIN: No obvious rash, lesion, or ulcer. Left lower extremity anterior shin has 2 huge areas of injury, which look like caked blood. No erythema, increased warmth , swelling or drainage was noted beneath these plaques.    LABORATORY PANEL:   CBC  Recent Labs Lab 02/19/15 0439  WBC 7.8  HGB 9.8*  HCT 29.8*  PLT 133*   ------------------------------------------------------------------------------------------------------------------  Chemistries   Recent Labs Lab 02/16/15 2159  02/19/15 0439  NA 134*  --   --   K 4.6  --   --   CL 94*  --   --   CO2 29  --   --   GLUCOSE 141*  --   --   BUN 35*  --   --   CREATININE 1.00  < > 0.73  CALCIUM 8.5*  --   --   AST 26  --   --   ALT 11*  --   --   ALKPHOS 195*  --   --   BILITOT 1.1  --   --   < > = values in this interval not displayed. ------------------------------------------------------------------------------------------------------------------  Cardiac Enzymes  Recent Labs Lab 02/16/15 2159  TROPONINI 0.03   ------------------------------------------------------------------------------------------------------------------  RADIOLOGY:  No results found.  EKG:   Orders placed or performed during the hospital encounter of 02/16/15  . ED EKG 12-Lead  . ED EKG 12-Lead  . EKG 12-Lead  . EKG 12-Lead    ASSESSMENT AND PLAN:    This is an 81 year old Caucasian female admitted for sepsis.  1. Enterococcus faecalis Sepsis , continue vancomycin IV, get ID consultation as well as echocardiogram  Repeated blood cultures are negative so far. Excess site of origin. Patient is unclear, although could be due to the wounds in the left lower extremity anterior shin.    2. Pyuria, no UTI, cultures are positive for 60,000 colony-forming units of Streptococcus group B, given 3 days of levofloxacin already. DC Foley catheter  3. Acute Respiratory failure with  hypoxia: resolved - briefly on BiPap, now on 1 L of oxygen through nasal cannula  4. Hypotension:  - resolved with IVF, now off IV fluids, now hypertensive. Continue metoprolol, resume hydralazine  5. Chronic CHF: Diastolic.  - no current exacerbation - Preserved ejection fraction, continue Lasix.  6. Coronary artery disease: Stable continue secondary prevention  7. DVT prophylaxis: coumadin per pharmacy  8. GI prophylaxis: None  9 . Chronic atrial fibrillation on Coumadin therapy at home. Continue Coumadin. Heart rate seemed to be well controlled on metoprolol  The patient is DO NOT RESUSCITATE.   All the records are reviewed and case discussed with Care Management/Social Workerr. Management plans discussed with the patient, family and they are in agreement.  TOTAL TIME TAKING CARE OF THIS PATIENT: 40 minutes.  Prolonged discussion with patient's caregiver about discharge planning. Patient will likely be discharged home tomorrow on 2 week antibiotics therapy to cover Enterococcus faecalis, provided her repeated blood cultures are negative and echocardiogram is unremarkable for endocarditis. Infectious disease consultant is asked to see patient and we will follow his recommendations, times spent approximally 10 minutes on discussion with caregiver.  Greater than 50% of time spent coordinating care and counseling daughter/care givers.  Katharina Caper M.D on 02/19/2015 at 9:11 AM  Between 7am to 6pm - Pager - [phone]  After 6pm go to [email or web address] - password [email or web address]  [facility]  Office  [phone]  CC: Primary care physician; Pcp Not In System

## 2015-02-19 NOTE — Progress Notes (Signed)
Notify Dr. Winona Legato via telephone that patient would like to take align probiotic brought in from home, okay per MD to use home medications daily.

## 2015-02-19 NOTE — Progress Notes (Signed)
ANTICOAGULATION CONSULT NOTE - Initial Consult  Pharmacy Consult for Warfarin Indication: atrial fibrillation  Allergies  Allergen Reactions  . Codeine Phosphate     REACTION: myalgia  . Penicillins     REACTION: questionable    Patient Measurements: Height: 5\' 4"  (162.6 cm) Weight: 177 lb 14.4 oz (80.695 kg) IBW/kg (Calculated) : 54.7   Vital Signs: Temp: 98.2 F (36.8 C) (06/26 0425) Temp Source: Oral (06/26 0425) BP: 151/82 mmHg (06/26 0425) Pulse Rate: 83 (06/26 0425)  Labs:  Recent Labs  02/16/15 2159 02/18/15 0309 02/19/15 0439  HGB 9.8* 9.2* 9.8*  HCT 30.7* 27.9* 29.8*  PLT 142* 110* 133*  APTT 48*  --   --   LABPROT 16.3* 17.4* 16.8*  INR 1.29 1.40 1.34  CREATININE 1.00 0.85 0.73  TROPONINI 0.03  --   --     Estimated Creatinine Clearance: 49 mL/min (by C-G formula based on Cr of 0.73).   Medical History: Past Medical History  Diagnosis Date  . Coronary artery disease     s/p CABG 1998 and s/p stents placed in the right coronary artery in 01/2008  . Atrial fibrillation   . Carotid stenosis   . Unspecified diastolic heart failure   . Hypertension   . Hyperlipidemia   . Pulmonary hypertension   . Renal artery stenosis     bilateral  . Chronic renal insufficiency   . Diverticular disease   . IBS (irritable bowel syndrome)   . Anemia   . Endometriosis   . Hypothermia     Medications:  Scheduled:  . acidophilus  1 capsule Oral Daily  . aspirin EC  81 mg Oral Daily  . docusate sodium  200 mg Oral Daily  . DULoxetine  30 mg Oral Daily  . levofloxacin (LEVAQUIN) IV  750 mg Intravenous Q48H  . levothyroxine  50 mcg Oral Daily  . LORazepam  0.25 mg Oral BID WC  . LORazepam  0.5 mg Oral QPM  . metoprolol tartrate  25 mg Oral BID  . pravastatin  40 mg Oral Daily  . sodium chloride  3 mL Intravenous Q12H  . torsemide  10 mg Oral QODAY  . traZODone  50 mg Oral QHS  . vancomycin  750 mg Intravenous Q18H  . warfarin  2.5 mg Oral Daily  .  Warfarin - Pharmacist Dosing Inpatient   Does not apply q1800    Assessment: 81 yo female on Warfarin for Atrial Fibrillaton. Home dose is 2.5mg  daily.  Goal of Therapy:  INR 2-3 Monitor platelets by anticoagulation protocol: Yes   Plan:  6/26 INR = 1.34. Will increase dose of Warfarin to 3mg  daily at 1100. INR ordered for am.  Patient on Levaquin,Vancomycin.  Patient on Heparin subcutaneous q8h currently.  Odie Sera, RPh Clinical Pharmacist 02/19/2015

## 2015-02-19 NOTE — Plan of Care (Signed)
Problem: Discharge Progression Outcomes Goal: Other Discharge Outcomes/Goals Outcome: Progressing Pt is alert and oriented x 2, family at bedside, foley removed with incontinence post foley removal, fair appetite, c/o nausea improved with zofran and probitotic, continues on 2 l oxygen, bedridden, c/o generalized pain improved with hydrocodone, vitals signs remain stable, afebrile throughout shift, echo performed in am, personal sitters at bedside.

## 2015-02-20 ENCOUNTER — Inpatient Hospital Stay: Payer: Medicare Other

## 2015-02-20 ENCOUNTER — Encounter: Payer: Self-pay | Admitting: Radiology

## 2015-02-20 LAB — PROTIME-INR
INR: 1.57
Prothrombin Time: 19 seconds — ABNORMAL HIGH (ref 11.4–15.0)

## 2015-02-20 MED ORDER — IOHEXOL 240 MG/ML SOLN
25.0000 mL | INTRAMUSCULAR | Status: AC
  Administered 2015-02-20: 14:00:00 25 mL via ORAL

## 2015-02-20 MED ORDER — LEVOFLOXACIN 500 MG PO TABS
500.0000 mg | ORAL_TABLET | Freq: Every day | ORAL | Status: DC
  Administered 2015-02-21 – 2015-02-22 (×2): 500 mg via ORAL
  Filled 2015-02-20 (×2): qty 1

## 2015-02-20 MED ORDER — LORAZEPAM 0.5 MG PO TABS
0.2500 mg | ORAL_TABLET | Freq: Every evening | ORAL | Status: DC
  Administered 2015-02-20 – 2015-02-21 (×2): 0.25 mg via ORAL
  Filled 2015-02-20: qty 1

## 2015-02-20 MED ORDER — IOHEXOL 300 MG/ML  SOLN
100.0000 mL | Freq: Once | INTRAMUSCULAR | Status: AC | PRN
  Administered 2015-02-20: 16:00:00 100 mL via INTRAVENOUS

## 2015-02-20 MED ORDER — SODIUM CHLORIDE 0.9 % IV SOLN: INTRAVENOUS | Status: DC

## 2015-02-20 MED ORDER — HYDROCODONE-ACETAMINOPHEN 5-325 MG PO TABS
0.5000 | ORAL_TABLET | Freq: Four times a day (QID) | ORAL | Status: DC | PRN
  Administered 2015-02-21: 0.5 via ORAL
  Filled 2015-02-20: qty 1

## 2015-02-20 NOTE — Progress Notes (Signed)
[facility] Date of Admission:  02/16/2015     ID: [name] is a 81 y.o. female with  Enterococcal bacteremia  Active Problems:   Sepsis  Subjective: Per sitter having more nausea and abd pain. Has not vomited. No fevers.   ROS  Eleven systems are reviewed and negative except per hpi  Medications:  Antibiotics Given (last 72 hours)    Date/Time Action Medication Dose Rate   02/17/15 1945 Given   vancomycin (VANCOCIN) IVPB 750 mg/150 ml premix 750 mg 150 mL/hr   02/18/15 1159 Given   vancomycin (VANCOCIN) IVPB 750 mg/150 ml premix 750 mg 150 mL/hr   02/18/15 1709 Given   levofloxacin (LEVAQUIN) IVPB 750 mg 750 mg 100 mL/hr   02/19/15 0458 Given   vancomycin (VANCOCIN) IVPB 750 mg/150 ml premix 750 mg 150 mL/hr   02/19/15 2324 Given   vancomycin (VANCOCIN) IVPB 750 mg/150 ml premix 750 mg 150 mL/hr     . acidophilus  1 capsule Oral Daily  . ALIGN  4 mg Oral Daily  . aspirin EC  81 mg Oral Daily  . docusate sodium  200 mg Oral Daily  . DULoxetine  30 mg Oral Daily  . levofloxacin  500 mg Oral Daily  . levothyroxine  50 mcg Oral Daily  . LORazepam  0.25 mg Oral BID WC  . LORazepam  0.25 mg Oral QPM  . metoprolol tartrate  25 mg Oral BID  . pravastatin  40 mg Oral Daily  . sodium chloride  3 mL Intravenous Q12H  . torsemide  10 mg Oral QODAY  . traZODone  50 mg Oral QHS  . vancomycin  750 mg Intravenous Q18H  . warfarin  3 mg Oral Daily  . Warfarin - Pharmacist Dosing Inpatient   Does not apply q1800    Objective: Vital signs in last 24 hours: Temp:  [97.4 F (36.3 C)-98.3 F (36.8 C)] 97.4 F (36.3 C) (06/27 0517) Pulse Rate:  [79-107] 79 (06/27 0517) Resp:  [20] 20 (06/27 0517) BP: (149-151)/(77-81) 149/81 mmHg (06/27 0517) SpO2:  [94 %-100 %] 100 % (06/27 0517) Weight:  [82.101 kg (181 lb)] 82.101 kg (181 lb) (06/27 0500) Constitutional: Frail elderly, very quiet  HENT: [place]/AT, PERRLA, no scleral  icterus Mouth/Throat: Oropharynx is clear and dry . No oropharyngeal exudate.  Cardiovascular: reg 2/6 sm Pulmonary/Chest: Effort normal and breath sounds normal. No respiratory distress. has no wheezes.  Neck supple, no nuchal rigidity Abdominal: Soft. Bowel sounds are normal. exhibits no distension. Mild diffuse tenderness.  Lymphadenopathy: no cervical adenopathy. No axillary adenopathy Neurological: alert and interactive  Skin: Skin is warm and dry. LLE with 2 large eschar which I removed - there is a clean based ulcer under each with no evidence purlence of erythema GU foley in place Psychiatric: a normal mood and affect. behavior is normal.  Lab Results  Recent Labs  02/18/15 0309 02/19/15 0439  WBC 7.5 7.8  HGB 9.2* 9.8*  HCT 27.9* 29.8*  CREATININE 0.85 0.73    Microbiology: Results for orders placed or performed during the hospital encounter of 02/16/15  MRSA PCR Screening     Status: None   Collection Time: 02/16/15  9:37 PM  Result Value Ref Range Status   MRSA by PCR NEGATIVE NEGATIVE Final    Comment:        The GeneXpert MRSA Assay (FDA approved for NASAL specimens only), is one component of a comprehensive MRSA colonization surveillance program.  It is not intended to diagnose MRSA infection nor to guide or monitor treatment for MRSA infections.   Blood Culture (routine x 2)     Status: None   Collection Time: 02/16/15 10:00 PM  Result Value Ref Range Status   Specimen Description BLOOD  Final   Special Requests NONE  Final   Culture  Setup Time   Final    GRAM POSITIVE COCCI IN BOTH AEROBIC AND ANAEROBIC BOTTLES CRITICAL RESULT CALLED TO, READ BACK BY AND VERIFIED WITH: BRITTNEY KILLINGSWORTH ON 02/17/15 AT 1325 BY JEF    Culture   Final    ENTEROCOCCUS FAECALIS IN BOTH AEROBIC AND ANAEROBIC BOTTLES    Report Status 02/19/2015 FINAL  Final   Organism ID, Bacteria ENTEROCOCCUS FAECALIS  Final      Susceptibility   Enterococcus faecalis -  MIC*    AMPICILLIN <=2 SENSITIVE Sensitive     LINEZOLID 2 SENSITIVE Sensitive     * ENTEROCOCCUS FAECALIS  Urine culture     Status: None (Preliminary result)   Collection Time: 02/16/15 10:35 PM  Result Value Ref Range Status   Specimen Description URINE, CLEAN CATCH  Final   Special Requests NONE  Final   Culture   Final    60,000 COLONIES/ml GROUP B STREP(S.AGALACTIAE)ISOLATED Virtually 100% of S. agalactiae (Group B) strains are susceptible to Penicillin.  For Penicillin-allergic patients, Erythromycin (85-95% sensitive) and Clindamycin (80% sensitive) are drugs of choice. Contact microbiology lab to request sensitivities if  needed within 7 days.    Report Status PENDING  Incomplete  Blood Culture (routine x 2)     Status: None   Collection Time: 02/16/15 10:55 PM  Result Value Ref Range Status   Specimen Description BLOOD  Final   Special Requests NONE  Final   Culture  Setup Time   Final    GRAM POSITIVE COCCI IN BOTH AEROBIC AND ANAEROBIC BOTTLES CRITICAL RESULT CALLED TO, READ BACK BY AND VERIFIED WITH: BRITTNEY KILLINGSWORTH ON 02/17/15 AT 1325 BY JEF    Culture   Final    ENTEROCOCCUS FAECALIS IN BOTH AEROBIC AND ANAEROBIC BOTTLES    Report Status 02/19/2015 FINAL  Final   Organism ID, Bacteria ENTEROCOCCUS FAECALIS  Final      Susceptibility   Enterococcus faecalis - MIC*    AMPICILLIN <=2 SENSITIVE Sensitive     LINEZOLID 2 SENSITIVE Sensitive     * ENTEROCOCCUS FAECALIS  Culture, blood (routine x 2)     Status: None (Preliminary result)   Collection Time: 02/18/15  8:57 AM  Result Value Ref Range Status   Specimen Description BLOOD  Final   Special Requests LEFT ANTECUBITAL  Final   Culture NO GROWTH 2 DAYS  Final   Report Status PENDING  Incomplete  Culture, blood (routine x 2)     Status: None (Preliminary result)   Collection Time: 02/18/15  8:57 AM  Result Value Ref Range Status   Specimen Description BLOOD  Final   Special Requests RIGHT ANTECUBITAL   Final   Culture NO GROWTH 2 DAYS  Final   Report Status PENDING  Incomplete    Studies/Results: No results found.  Assessment/Plan: [name] is a 81 y.o. female with history of dementia, CKD, A fib, CAD admitted 6/24 with sepsis. On admission had fever to 103.3, wbc 11.7 and She had + UA with TNTC WBC, UCX with GB Strep and cx with Enterococcus. Has been treated with vanco and levofloxacin and had defervesced. Her LE  wounds are very clean and unlikely to be source.  Likely GU source even though ucx did not grow enterococcus she did have TNTC wbc on UA. She does have some IBS sxs and chronic intermittent abd pain per family so could be GI source as well.  The organism is sensitive to amp, linezolid, levo, cipro and vanco.  Echo without vegetation. Repeat bcx neg. From 6/25  Recommendations Echo neg for vegetation and repeat cx negative from 6/25.  would give 10 day course of abx from time of neg bcx Given abd pain and nausea will check CT abd to eval for diverticulitis or other pathology Dced vanco today Cont  levofloxacin at dc to complete course of 10 days from neg cx Stop date 7/5 Thank you very much for allowing me to participate in the care of this patient. Please call with questions.  Thank you very much for the consult. Will follow with you.  Liberato Stansbery   02/20/2015, 1:01 PM

## 2015-02-20 NOTE — Plan of Care (Signed)
Problem: Discharge Progression Outcomes Goal: Other Discharge Outcomes/Goals 1. C/o abdominal pain relieved by PRN Norco. C/o nausea relieved by PRN Zofran.  2. Hemodynamically:              -VSS, afebrile, Abx given as scheduled             -O2 at 2L [place] keeping sats in high 90's. Lung sounds diminished. SOB w/ exertion 3. Activity: Pt remained in bed throughout shift with personal sitter at bedside for immediate assistance  4. Diet: Tolerating diet without problems - eating well with family help.

## 2015-02-20 NOTE — Progress Notes (Signed)
[facility] [place] [facility]   PATIENT NAME: [name] MoreCharlotte [name]    MR#:  [id number]  DATE OF BIRTH:  11/05/1922  SUBJECTIVE:  CHIEF COMPLAINT:   Chief Complaint  Patient presents with  . Shortness of Breath   [name] somnolent today in the morning. Not able to review systems. Repeated blood cultures showed no growth in 2 days. Initial blood cultures revealed enterococcus faecalis sensitive to ampicillin as well as linezolid, as well as levofloxacin and ciprofloxacin according to Dr. Sampson GoonFitzgerald, levofloxacin was recommended to be on discharge by Dr. Sampson GoonFitzgerald. Echocardiogram showed no valvular abnormalities.   REVIEW OF SYSTEMS:   ROS Unable to perform due to dementia  DRUG ALLERGIES:   Allergies  Allergen Reactions  . Codeine Phosphate     REACTION: myalgia  . Penicillins     REACTION: questionable    VITALS:  Blood pressure 149/81, pulse 79, temperature 97.4 F (36.3 C), temperature source Oral, resp. rate 20, height 5\' 4"  (1.626 m), weight 82.101 kg (181 lb), SpO2 100 %.  PHYSICAL EXAMINATION:  GENERAL:  81 y.o.-year-old patient lying in the bed with no acute distress. Somnolent, intermittently EYES: Pupils equal, round, reactive to light and accommodation. No scleral icterus. Extraocular muscles intact.  HEENT: Head atraumatic, normocephalic. Oropharynx and nasopharynx clear. MMdry NECK:  Supple, no jugular venous distention. No thyroid enlargement, no tenderness.  LUNGS: Normal breath sounds bilaterally, no wheezing, rales, rhonchi or crepitation. No use of accessory muscles of respiration. nasal canula . Diminished breath sounds bilaterally at bases. Poor effort CARDIOVASCULAR: S1, S2 normal . Irregularly irregular. No murmurs, rubs, or gallops.  ABDOMEN: Soft, nontender, nondistended. Bowel sounds present. No organomegaly or mass.  EXTREMITIES: No pedal edema, cyanosis, or clubbing. cold NEUROLOGIC: Cranial nerves II through XII are intact.  Moves extremities spontaneously PSYCHIATRIC: The patient is alert and calm SKIN: No obvious rash, lesion, or ulcer. Left lower extremity anterior shin has 2 huge areas of injury, which look like caked blood. No erythema, increased warmth , swelling or drainage was noted beneath these plaques.    LABORATORY PANEL:   CBC  Recent Labs Lab 02/19/15 0439  WBC 7.8  HGB 9.8*  HCT 29.8*  PLT 133*   ------------------------------------------------------------------------------------------------------------------  Chemistries   Recent Labs Lab 02/16/15 2159  02/19/15 0439  NA 134*  --   --   K 4.6  --   --   CL 94*  --   --   CO2 29  --   --   GLUCOSE 141*  --   --   BUN 35*  --   --   CREATININE 1.00  < > 0.73  CALCIUM 8.5*  --   --   AST 26  --   --   ALT 11*  --   --   ALKPHOS 195*  --   --   BILITOT 1.1  --   --   < > = values in this interval not displayed. ------------------------------------------------------------------------------------------------------------------  Cardiac Enzymes  Recent Labs Lab 02/16/15 2159  TROPONINI 0.03   ------------------------------------------------------------------------------------------------------------------  RADIOLOGY:  No results found.  EKG:   Orders placed or performed during the hospital encounter of 02/16/15  . ED EKG 12-Lead  . ED EKG 12-Lead  . EKG 12-Lead  . EKG 12-Lead    ASSESSMENT AND PLAN:    This is an 81 year old Caucasian female admitted for sepsis.  1. Enterococcus faecalis Sepsis , continue vancomycin IV, levofloxacin orally , appreciate Dr. Jarrett AblesFitzgerald's input .  echocardiogram showed no valvular abnormalities,  Repeated blood cultures are negative so far. Exact site of origin of infection is unclear, although Dr. Sampson Goon feels it could be gastrointestinal versus genitourinary site. Not likely wounds in the left lower extremity anterior shin,, per ID. As patient remains very somnolent today,  likely discharge to skilled nursing facility is going to happen tomorrow. This was discussed with care management   2. Pyuria, not likely  UTI, cultures are positive for 60,000 colony-forming units of Streptococcus group B, Dr. Sampson Goon recommended levofloxacin to be continued ,  Foley catheter is out. Get postvoid residual to rule out retention  3. Acute Respiratory failure with hypoxia: resolved - briefly on BiPap, now on 1 L of oxygen through nasal cannula  4. Hypotension:  - resolved with IVF, now off IV fluids, now hypertensive. Continue metoprolol, resumed hydralazine  5. Chronic CHF: Diastolic.  - no current exacerbation. Ejection fraction of 45-50% on the current echocardiogram, hypokinesis of anterior as well as anteroseptal myocardium with moderate mitral valve regurgitation, and severe tricuspid regurgitation, also elevated pulmonary pressures,  continue Lasix.  6. Coronary artery disease: Stable continue secondary prevention  7. DVT prophylaxis: coumadin per pharmacy  8. GI prophylaxis: None  9 . Chronic atrial fibrillation on Coumadin therapy at home. Continue Coumadin. Heart rate is well controlled on metoprolol, INR is low, subtherapeutic,  advancing Coumadin per pharmacy.   The patient is DO NOT RESUSCITATE.   All the records are reviewed and case discussed with Care Management/Social Workerr. Management plans discussed with the patient, family and they are in agreement.  TOTAL TIME TAKING CARE OF THIS PATIENT: 40 minutes.  Prolonged discussion with patient's husband about discharge planning. Medical condition was discussed as well as discharge requests to be discharged tomorrow, which we are agreeable to  Greater than 50% of time spent coordinating care and counseling daughter/care givers.  Katharina Caper M.D on 02/20/2015 at 10:36 AM  Between 7am to 6pm - Pager - [phone]  After 6pm go to [email or web address] - password [email or web address]  [facility]   Office  [phone]  CC: Primary care physician; Pcp Not In System

## 2015-02-20 NOTE — Progress Notes (Addendum)
ANTICOAGULATION CONSULT NOTE - Follow Up  Pharmacy Consult for Warfarin Indication: atrial fibrillation  Allergies  Allergen Reactions  . Codeine Phosphate     REACTION: myalgia  . Penicillins     REACTION: questionable    Patient Measurements: Height: 5\' 4"  (162.6 cm) Weight: 181 lb (82.101 kg) IBW/kg (Calculated) : 54.7   Vital Signs: Temp: 97.4 F (36.3 C) (06/27 0517) Temp Source: Oral (06/27 0517) BP: 149/81 mmHg (06/27 0517) Pulse Rate: 79 (06/27 0517)  Labs:  Recent Labs  02/18/15 0309 02/19/15 0439 02/20/15 0438  HGB 9.2* 9.8*  --   HCT 27.9* 29.8*  --   PLT 110* 133*  --   LABPROT 17.4* 16.8* 19.0*  INR 1.40 1.34 1.57  CREATININE 0.85 0.73  --     Estimated Creatinine Clearance: 49.4 mL/min (by C-G formula based on Cr of 0.73).   Medical History: Past Medical History  Diagnosis Date  . Coronary artery disease     s/p CABG 1998 and s/p stents placed in the right coronary artery in 01/2008  . Atrial fibrillation   . Carotid stenosis   . Unspecified diastolic heart failure   . Hypertension   . Hyperlipidemia   . Pulmonary hypertension   . Renal artery stenosis     bilateral  . Chronic renal insufficiency   . Diverticular disease   . IBS (irritable bowel syndrome)   . Anemia   . Endometriosis   . Hypothermia     Medications:  Scheduled:  . acidophilus  1 capsule Oral Daily  . ALIGN  4 mg Oral Daily  . aspirin EC  81 mg Oral Daily  . docusate sodium  200 mg Oral Daily  . DULoxetine  30 mg Oral Daily  . levofloxacin  500 mg Oral Daily  . levothyroxine  50 mcg Oral Daily  . LORazepam  0.25 mg Oral BID WC  . LORazepam  0.25 mg Oral QPM  . metoprolol tartrate  25 mg Oral BID  . pravastatin  40 mg Oral Daily  . sodium chloride  3 mL Intravenous Q12H  . torsemide  10 mg Oral QODAY  . traZODone  50 mg Oral QHS  . vancomycin  750 mg Intravenous Q18H  . warfarin  3 mg Oral Daily  . Warfarin - Pharmacist Dosing Inpatient   Does not apply  q1800    Assessment: 81 yo female on Warfarin for Atrial Fibrillaton. Home dose is 2.5mg  daily.  Goal of Therapy:  INR 2-3 Monitor platelets by anticoagulation protocol: Yes   Plan:  INR = 1.57. Will continue current order of warfarin 3 mg PO q1800. PT/INR with am labs.   Demetrius Charity, PharmD Clinical Pharmacist 02/20/2015

## 2015-02-20 NOTE — Plan of Care (Signed)
Problem: Discharge Progression Outcomes Goal: Other Discharge Outcomes/Goals Outcome: Progressing Pt complained of nausea today, could not tolerate medications.Zofran given IV and sips of gingerale given. CT of pelvis and abd pending Sitter at bedside throughout shift.

## 2015-02-20 NOTE — Progress Notes (Signed)
Per MD note patient will D/C tomorrow (Tuesday 02/21/15). Clinical Child psychotherapistocial Worker (CSW) made [employer] admissions coordinator [facility] aware of above. Plan is for patient to [facility]. CSW will continue to follow and assist as needed.   Jetta LoutBailey Morgan, LCSWA [phone]

## 2015-02-20 NOTE — Progress Notes (Signed)
Speech Language Pathology Treatment: Dysphagia  Patient Details Name: [name] BonierCharlotte R [name] MRN: [id number] DOB: 04/15/1923 Today's Date: 02/20/2015 Time: [id number] SLP Time Calculation (min) (ACUTE ONLY): 32 min  Assessment / Plan / Recommendation Clinical Impression  Pt presents with moderate oropharyngeal dysphagia this visit characterized by wet vocal quality with thin liquids. During session family stated pt was on thickened liquids in LTC facility. Pt was noted to have wet vocal quality with thin liquids and unable to elicit purposeful cough. Pt able to clear in time with multiple swallows with st cuing for swallows. St administered trials of nectar thick liquids and honey thick liquids to assess safety and toleration. St recommends pt be downgraded to honey thick liquids.  Family states that even at LTC facility they would give water and ginger ale for pleasure. ST educated on risks of aspiration and recommendations to only administer thickened liquids. Pt family able to return verbalize understanding and risk of aspiration and aspiration pna with thin liquids.    HPI Other Pertinent Information: pt husband and family present during session, and feeding pt morning meal   Pertinent Vitals Pain Assessment: No/denies pain  SLP Plan  Continue with current plan of care    Recommendations Diet recommendations: Honey-thick liquid;Dysphagia 1 (puree) Liquids provided via: Cup Medication Administration: Crushed with puree Supervision: Staff to assist with self feeding Compensations: Minimize environmental distractions;Slow rate;Small sips/bites              Oral Care Recommendations: Staff/trained caregiver to provide oral care Plan: Continue with current plan of care    GO     Meredith PelStacie Harris [facility] 02/20/2015, 9:06 AM   1}

## 2015-02-20 NOTE — Clinical Documentation Improvement (Signed)
  02/17/15 H&P:  "Chronic Renal Insufficiency" 02/19/15 Consult note:  "Chronic Kidney Disease" 02/16/15-GFR= 48 02/18/15-GFR= 59 02/19/15-GFR= > 60  Please clarify the stage of Chronic Kidney Disease if possible.  CKD Stage I - GFR > OR = 90 CKD Stage II - GFR 60-80 CKD Stage III - GFR 30-59 CKD Stage IV - GFR 15-29 CKD Stage V - GFR < 15 ESRD (End Stage Renal Disease) Other condition_____________ Cannot Clinically determine   Thank you,  Sharyn Creameronna Jarold Macomber, BSN, RN [place] HIM/Clinical Documentation Specialist [email or web address] [phone]

## 2015-02-20 NOTE — Consult Note (Signed)
ANTIBIOTIC CONSULT NOTE - Follow up  Pharmacy Consult for Vancomycin, Levaquin Indication: Sepsis/UTI  Allergies  Allergen Reactions  . Codeine Phosphate     REACTION: myalgia  . Penicillins     REACTION: questionable    Patient Measurements: Height: 5\' 4"  (162.6 cm) Weight: 177 lb 14.4 oz (80.695 kg) IBW/kg (Calculated) : 54.7 Adjusted Body Weight: 65.6kg  Vital Signs: Temp: 98.3 F (36.8 C) (06/26 2049) Temp Source: Oral (06/26 2049) BP: 151/77 mmHg (06/26 2049) Pulse Rate: 107 (06/26 2049) Intake/Output from previous day: 06/26 0701 - 06/27 0700 In: 600 [P.O.:600] Out: 300 [Urine:300] Intake/Output from this shift:    Labs:  Recent Labs  02/18/15 0309 02/19/15 0439  WBC 7.5 7.8  HGB 9.2* 9.8*  PLT 110* 133*  CREATININE 0.85 0.73   Estimated Creatinine Clearance: 49 mL/min (by C-G formula based on Cr of 0.73).  Microbiology: Recent Results (from the past 720 hour(s))  MRSA PCR Screening     Status: None   Collection Time: 02/16/15  9:37 PM  Result Value Ref Range Status   MRSA by PCR NEGATIVE NEGATIVE Final    Comment:        The GeneXpert MRSA Assay (FDA approved for NASAL specimens only), is one component of a comprehensive MRSA colonization surveillance program. It is not intended to diagnose MRSA infection nor to guide or monitor treatment for MRSA infections.   Blood Culture (routine x 2)     Status: None   Collection Time: 02/16/15 10:00 PM  Result Value Ref Range Status   Specimen Description BLOOD  Final   Special Requests NONE  Final   Culture  Setup Time   Final    GRAM POSITIVE COCCI IN BOTH AEROBIC AND ANAEROBIC BOTTLES CRITICAL RESULT CALLED TO, READ BACK BY AND VERIFIED WITH: BRITTNEY KILLINGSWORTH ON 02/17/15 AT 1325 BY JEF    Culture   Final    ENTEROCOCCUS FAECALIS IN BOTH AEROBIC AND ANAEROBIC BOTTLES    Report Status 02/19/2015 FINAL  Final   Organism ID, Bacteria ENTEROCOCCUS FAECALIS  Final      Susceptibility    Enterococcus faecalis - MIC*    AMPICILLIN <=2 SENSITIVE Sensitive     LINEZOLID 2 SENSITIVE Sensitive     * ENTEROCOCCUS FAECALIS  Urine culture     Status: None (Preliminary result)   Collection Time: 02/16/15 10:35 PM  Result Value Ref Range Status   Specimen Description URINE, CLEAN CATCH  Final   Special Requests NONE  Final   Culture   Final    60,000 COLONIES/ml GROUP B STREP(S.AGALACTIAE)ISOLATED Virtually 100% of S. agalactiae (Group B) strains are susceptible to Penicillin.  For Penicillin-allergic patients, Erythromycin (85-95% sensitive) and Clindamycin (80% sensitive) are drugs of choice. Contact microbiology lab to request sensitivities if  needed within 7 days.    Report Status PENDING  Incomplete  Blood Culture (routine x 2)     Status: None   Collection Time: 02/16/15 10:55 PM  Result Value Ref Range Status   Specimen Description BLOOD  Final   Special Requests NONE  Final   Culture  Setup Time   Final    GRAM POSITIVE COCCI IN BOTH AEROBIC AND ANAEROBIC BOTTLES CRITICAL RESULT CALLED TO, READ BACK BY AND VERIFIED WITH: BRITTNEY KILLINGSWORTH ON 02/17/15 AT 1325 BY JEF    Culture   Final    ENTEROCOCCUS FAECALIS IN BOTH AEROBIC AND ANAEROBIC BOTTLES    Report Status 02/19/2015 FINAL  Final   Organism ID, Bacteria  ENTEROCOCCUS FAECALIS  Final      Susceptibility   Enterococcus faecalis - MIC*    AMPICILLIN <=2 SENSITIVE Sensitive     LINEZOLID 2 SENSITIVE Sensitive     * ENTEROCOCCUS FAECALIS  Culture, blood (routine x 2)     Status: None (Preliminary result)   Collection Time: 02/18/15  8:57 AM  Result Value Ref Range Status   Specimen Description BLOOD  Final   Special Requests LEFT ANTECUBITAL  Final   Culture NO GROWTH < 24 HOURS  Final   Report Status PENDING  Incomplete  Culture, blood (routine x 2)     Status: None (Preliminary result)   Collection Time: 02/18/15  8:57 AM  Result Value Ref Range Status   Specimen Description BLOOD  Final   Special  Requests RIGHT ANTECUBITAL  Final   Culture NO GROWTH < 24 HOURS  Final   Report Status PENDING  Incomplete    Medical History: Past Medical History  Diagnosis Date  . Coronary artery disease     s/p CABG 1998 and s/p stents placed in the right coronary artery in 01/2008  . Atrial fibrillation   . Carotid stenosis   . Unspecified diastolic heart failure   . Hypertension   . Hyperlipidemia   . Pulmonary hypertension   . Renal artery stenosis     bilateral  . Chronic renal insufficiency   . Diverticular disease   . IBS (irritable bowel syndrome)   . Anemia   . Endometriosis   . Hypothermia     Medications:  Anti-infectives    Start     Dose/Rate Route Frequency Ordered Stop   02/18/15 1800  levofloxacin (LEVAQUIN) IVPB 750 mg  Status:  Discontinued    Comments:  Patient started Levaquin on 6/23.   750 mg 100 mL/hr over 90 Minutes Intravenous Every 48 hours 02/17/15 0436 02/19/15 0840   02/17/15 1700  vancomycin (VANCOCIN) IVPB 750 mg/150 ml premix    Comments:  Patient started Vancomycin on 6/23.   750 mg 150 mL/hr over 60 Minutes Intravenous Every 18 hours 02/17/15 0438     02/17/15 0830  aztreonam (AZACTAM) 2 g in dextrose 5 % 50 mL IVPB  Status:  Discontinued     2 g 100 mL/hr over 30 Minutes Intravenous Every 8 hours 02/17/15 0436 02/17/15 1443   02/16/15 2200  levofloxacin (LEVAQUIN) IVPB 750 mg     750 mg 100 mL/hr over 90 Minutes Intravenous  Once 02/16/15 2159 02/17/15 0049   02/16/15 2200  aztreonam (AZACTAM) 2 g in dextrose 5 % 50 mL IVPB     2 g 100 mL/hr over 30 Minutes Intravenous  Once 02/16/15 2159 02/17/15 0100   02/16/15 2200  vancomycin (VANCOCIN) IVPB 1000 mg/200 mL premix     1,000 mg 200 mL/hr over 60 Minutes Intravenous  Once 02/16/15 2159 02/17/15 0012     Assessment: 81 yo female presenting to ED with fever and hypoxia.  Pharmacy consulted for dosing of Vancomycin, Levaquin. +Bcx  Goal of Therapy:  Vancomycin trough level 15-20  mcg/ml  Plan:  Patient on Vancomycin  IV Q18H to begin 6/24 at 17:00.   6/26 @ 22:53 --Trough = 17 mcg/ml. Continue current dosing.    Patient on Levaquin  IV Q48H.  Bcx: + GPC= Enterococcus- sensitive to Ampicillin (see ID note 6/26). (penicillin allergy listed)  Pharmacy to follow and adjust as per consult.     Stormy Card, [facility] Clinical Pharmacist 02/20/2015

## 2015-02-20 NOTE — Care Management (Signed)
Important Message  Patient Details  Name: JERNIYA SMUTNY MRN: [id number] Date of Birth: [birth year]   Medicare Important Message Given:  Yes-second notification given    Olegario Messier A Allmond 02/20/2015, 1:42 PM

## 2015-02-21 ENCOUNTER — Inpatient Hospital Stay: Payer: Medicare Other

## 2015-02-21 LAB — BLOOD GAS, ARTERIAL
ACID-BASE EXCESS: 6.4 mmol/L — AB (ref 0.0–3.0)
Allens test (pass/fail): POSITIVE — AB
Bicarbonate: 33.5 mEq/L — ABNORMAL HIGH (ref 21.0–28.0)
FIO2: 0.36 %
O2 Saturation: 99.1 %
PATIENT TEMPERATURE: 37
PH ART: 7.37 (ref 7.350–7.450)
pCO2 arterial: 58 mmHg — ABNORMAL HIGH (ref 32.0–48.0)
pO2, Arterial: 139 mmHg — ABNORMAL HIGH (ref 83.0–108.0)

## 2015-02-21 LAB — LACTATE DEHYDROGENASE, PLEURAL OR PERITONEAL FLUID: LD FL: 79 U/L — AB (ref 3–23)

## 2015-02-21 LAB — BASIC METABOLIC PANEL
Anion gap: 6 (ref 5–15)
BUN: 21 mg/dL — AB (ref 6–20)
CHLORIDE: 100 mmol/L — AB (ref 101–111)
CO2: 30 mmol/L (ref 22–32)
Calcium: 8.5 mg/dL — ABNORMAL LOW (ref 8.9–10.3)
Creatinine, Ser: 0.79 mg/dL (ref 0.44–1.00)
GFR calc Af Amer: >60 mL/min (ref >60)
Glucose, Bld: 100 mg/dL — ABNORMAL HIGH (ref 65–99)
Potassium: 4.7 mmol/L (ref 3.5–5.1)
Sodium: 136 mmol/L (ref 135–145)

## 2015-02-21 LAB — GLUCOSE, SEROUS FLUID: Glucose, Fluid: 117 mg/dL

## 2015-02-21 LAB — PROTIME-INR
INR: 1.69
PROTHROMBIN TIME: 20.1 s — AB (ref 11.4–15.0)

## 2015-02-21 LAB — CREATININE, SERUM
CREATININE: 0.73 mg/dL (ref 0.44–1.00)
GFR calc Af Amer: >60 mL/min (ref >60)
GFR calc non Af Amer: >60 mL/min (ref >60)

## 2015-02-21 MED ORDER — FUROSEMIDE 10 MG/ML IJ SOLN
40.0000 mg | Freq: Once | INTRAMUSCULAR | Status: AC
Start: 2015-02-21 — End: 2015-02-21
  Administered 2015-02-21: 40 mg via INTRAVENOUS
  Filled 2015-02-21: qty 4

## 2015-02-21 MED ORDER — FUROSEMIDE 10 MG/ML IJ SOLN
20.0000 mg | Freq: Two times a day (BID) | INTRAMUSCULAR | Status: DC
  Administered 2015-02-21: 20 mg via INTRAVENOUS
  Filled 2015-02-21: qty 2

## 2015-02-21 NOTE — Progress Notes (Signed)
[facility] CLINIC INFECTIOUS DISEASE PROGRESS NOTE Date of Admission:  02/16/2015     ID: [name] is a 81 y.o. female with  Enterococcal bacteremia  Active Problems:   Sepsis  Subjective: Per sitter having more nausea and abd pain. Has not vomited. No fevers.   ROS  Eleven systems are reviewed and negative except per hpi  Medications:  Antibiotics Given (last 72 hours)    Date/Time Action Medication Dose Rate   02/18/15 1709 Given   levofloxacin (LEVAQUIN) IVPB 750 mg 750 mg 100 mL/hr   02/19/15 0458 Given   vancomycin (VANCOCIN) IVPB 750 mg/150 ml premix 750 mg 150 mL/hr   02/19/15 2324 Given   vancomycin (VANCOCIN) IVPB 750 mg/150 ml premix 750 mg 150 mL/hr   02/21/15 1003 Given   levofloxacin (LEVAQUIN) tablet 500 mg 500 mg      . acidophilus  1 capsule Oral Daily  . ALIGN  4 mg Oral Daily  . aspirin EC  81 mg Oral Daily  . docusate sodium  200 mg Oral Daily  . DULoxetine  30 mg Oral Daily  . furosemide  20 mg Intravenous BID  . levofloxacin  500 mg Oral Daily  . levothyroxine  50 mcg Oral Daily  . LORazepam  0.25 mg Oral BID WC  . LORazepam  0.25 mg Oral QPM  . metoprolol tartrate  25 mg Oral BID  . pravastatin  40 mg Oral Daily  . sodium chloride  3 mL Intravenous Q12H  . torsemide  10 mg Oral QODAY  . traZODone  50 mg Oral QHS  . warfarin  3 mg Oral Daily  . Warfarin - Pharmacist Dosing Inpatient   Does not apply q1800    Objective: Vital signs in last 24 hours: Temp:  [97.6 F (36.4 C)-97.9 F (36.6 C)] 97.9 F (36.6 C) (06/28 1324) Pulse Rate:  [75-94] 75 (06/28 1324) Resp:  [18-20] 18 (06/28 0453) BP: (130-192)/(86-91) 130/90 mmHg (06/28 1324) SpO2:  [91 %-95 %] 95 % (06/28 1324) Weight:  [75.569 kg (166 lb 9.6 oz)] 75.569 kg (166 lb 9.6 oz) (06/28 0516) Constitutional: Frail elderly, very quiet  HENT: [place]/AT, PERRLA, no scleral icterus Mouth/Throat: Oropharynx is clear and dry . No oropharyngeal exudate.  Cardiovascular: reg 2/6  sm Pulmonary/Chest: Effort normal and breath sounds normal. No respiratory distress. has no wheezes.  Neck supple, no nuchal rigidity Abdominal: Soft. Bowel sounds are normal. exhibits no distension. Mild diffuse tenderness.  Lymphadenopathy: no cervical adenopathy. No axillary adenopathy Neurological: alert and interactive  Skin: Skin is warm and dry. LLE with 2 large eschar which I removed - there is a clean based ulcer under each with no evidence purlence of erythema GU foley in place Psychiatric: a normal mood and affect. behavior is normal.  Lab Results  Recent Labs  02/19/15 0439 02/21/15 0422 02/21/15 1248  WBC 7.8  --   --   HGB 9.8*  --   --   HCT 29.8*  --   --   NA  --   --  136  K  --   --  4.7  CL  --   --  100*  CO2  --   --  30  BUN  --   --  21*  CREATININE 0.73 0.73 0.79    Microbiology: Results for orders placed or performed during the hospital encounter of 02/16/15  MRSA PCR Screening     Status: None   Collection Time: 02/16/15  9:37  PM  Result Value Ref Range Status   MRSA by PCR NEGATIVE NEGATIVE Final    Comment:        The GeneXpert MRSA Assay (FDA approved for NASAL specimens only), is one component of a comprehensive MRSA colonization surveillance program. It is not intended to diagnose MRSA infection nor to guide or monitor treatment for MRSA infections.   Blood Culture (routine x 2)     Status: None   Collection Time: 02/16/15 10:00 PM  Result Value Ref Range Status   Specimen Description BLOOD  Final   Special Requests NONE  Final   Culture  Setup Time   Final    GRAM POSITIVE COCCI IN BOTH AEROBIC AND ANAEROBIC BOTTLES CRITICAL RESULT CALLED TO, READ BACK BY AND VERIFIED WITH: BRITTNEY KILLINGSWORTH ON 02/17/15 AT 1325 BY JEF    Culture   Final    ENTEROCOCCUS FAECALIS IN BOTH AEROBIC AND ANAEROBIC BOTTLES    Report Status 02/19/2015 FINAL  Final   Organism ID, Bacteria ENTEROCOCCUS FAECALIS  Final      Susceptibility    Enterococcus faecalis - MIC*    AMPICILLIN <=2 SENSITIVE Sensitive     LINEZOLID 2 SENSITIVE Sensitive     * ENTEROCOCCUS FAECALIS  Urine culture     Status: None (Preliminary result)   Collection Time: 02/16/15 10:35 PM  Result Value Ref Range Status   Specimen Description URINE, CLEAN CATCH  Final   Special Requests NONE  Final   Culture   Final    60,000 COLONIES/ml GROUP B STREP(S.AGALACTIAE)ISOLATED Virtually 100% of S. agalactiae (Group B) strains are susceptible to Penicillin.  For Penicillin-allergic patients, Erythromycin (85-95% sensitive) and Clindamycin (80% sensitive) are drugs of choice. Contact microbiology lab to request sensitivities if  needed within 7 days.    Report Status PENDING  Incomplete  Blood Culture (routine x 2)     Status: None   Collection Time: 02/16/15 10:55 PM  Result Value Ref Range Status   Specimen Description BLOOD  Final   Special Requests NONE  Final   Culture  Setup Time   Final    GRAM POSITIVE COCCI IN BOTH AEROBIC AND ANAEROBIC BOTTLES CRITICAL RESULT CALLED TO, READ BACK BY AND VERIFIED WITH: BRITTNEY KILLINGSWORTH ON 02/17/15 AT 1325 BY JEF    Culture   Final    ENTEROCOCCUS FAECALIS IN BOTH AEROBIC AND ANAEROBIC BOTTLES    Report Status 02/19/2015 FINAL  Final   Organism ID, Bacteria ENTEROCOCCUS FAECALIS  Final      Susceptibility   Enterococcus faecalis - MIC*    AMPICILLIN <=2 SENSITIVE Sensitive     LINEZOLID 2 SENSITIVE Sensitive     * ENTEROCOCCUS FAECALIS  Culture, blood (routine x 2)     Status: None (Preliminary result)   Collection Time: 02/18/15  8:57 AM  Result Value Ref Range Status   Specimen Description BLOOD  Final   Special Requests LEFT ANTECUBITAL  Final   Culture NO GROWTH 3 DAYS  Final   Report Status PENDING  Incomplete  Culture, blood (routine x 2)     Status: None (Preliminary result)   Collection Time: 02/18/15  8:57 AM  Result Value Ref Range Status   Specimen Description BLOOD  Final   Special  Requests RIGHT ANTECUBITAL  Final   Culture NO GROWTH 3 DAYS  Final   Report Status PENDING  Incomplete    Studies/Results: Ct Abdomen Pelvis W Contrast  02/20/2015   CLINICAL DATA:  81 year old with Enterococcus  septicemia, likely GU source. Abdominal pain. History of dementia, chronic kidney disease, atrial ablation and irritable bowel syndrome. Initial encounter.  EXAM: CT ABDOMEN AND PELVIS WITH CONTRAST  TECHNIQUE: Multidetector CT imaging of the abdomen and pelvis was performed using the standard protocol following bolus administration of intravenous contrast.  CONTRAST:  100mL OMNIPAQUE IOHEXOL 300 MG/ML  SOLN  COMPARISON:  Portable chest 02/16/2015. CTs of the chest and abdomen 09/28/2011.  FINDINGS: Lower chest: There is marked cardiac enlargement status post median sternotomy. There is a moderate size right pleural effusion which is incompletely visualized. The visualized right lower lobe is completely collapsed. The left lung base is clear. There is no significant left pleural or pericardial effusion.  Hepatobiliary: No focal hepatic abnormalities identified. The IVC and hepatic veins are distended with reflux of contrast suggesting right heart failure. There is dependent sludge or small gallstones within the gallbladder lumen. There is possible mild gallbladder wall thickening, nonspecific. No significant biliary dilatation.  Pancreas: Stable appearance. Previously demonstrated pancreas divisum not well seen. No pancreatic ductal dilatation or surrounding inflammatory changes.  Spleen: Normal in size without focal abnormality.  Adrenals/Urinary Tract: Both adrenal glands appear normal.The left kidney demonstrates chronic atrophy and cortical thinning. The right kidney is normal in size with mild cortical scarring in the upper pole. There are small wedge-shaped areas of decreased attenuation in the upper and lower poles which could reflect infarcts or pyelonephritis. There is no perinephric soft  tissue stranding or hydronephrosis. The bladder is not optimally evaluated due to incomplete distension and artifact from the patient's right total hip arthroplasty.  Stomach/Bowel: No evidence of bowel wall thickening, distention or surrounding inflammatory change.There are fairly extensive diverticular changes throughout the distal colon without focal surrounding inflammation. A small amount of free pelvic fluid is present.  Vascular/Lymphatic: There are no enlarged abdominal or pelvic lymph nodes. There is diffuse atherosclerosis of the aorta, its branches and iliac arteries. The IVC and iliac veins are distended.  Reproductive: Previous hysterectomy.  No evidence of adnexal mass.  Other: Generalized anasarca with diffuse edema throughout the subcutaneous and mesenteric fat.  Musculoskeletal: No acute or significant osseous findings. Chronic superior endplate compression deformity at L3, lumbar degenerative changes and previous right total hip arthroplasty noted.  IMPRESSION: 1. Anasarca with generalized soft tissue edema, mild ascites and at least moderate size right pleural effusion. Findings are likely secondary to right heart failure given the patient's cardiomegaly, distension of the hepatic veins and IVC and reflux of contrast into the liver. 2. No obvious worse for infection identified within the abdomen, although mild right-sided pyelonephritis difficult to exclude. Chronic left renal atrophy appears unchanged. No hydronephrosis. 3. Small gallstones versus sludge in the gallbladder lumen with mild gallbladder wall thickening, nonspecific given the anasarca. 4. Right lower lobe collapse may be secondary to the pleural effusion. Chest radiographic follow up recommended. Thoracentesis should be considered. 5. Distal colonic diverticulosis. 6. Diffuse atherosclerosis.   Electronically Signed   By: Carey BullocksWilliam  Veazey M.D.   On: 02/20/2015 16:41    Assessment/Plan: [name] BonierCharlotte R Yott is a 81 y.o. female with  history of dementia, CKD, A fib, CAD admitted 6/24 with sepsis. On admission had fever to 103.3, wbc 11.7 and She had + UA with TNTC WBC, UCX with GB Strep and cx with Enterococcus. Has been treated with vanco and levofloxacin and had defervesced. Her LE wounds are very clean and unlikely to be source.  Likely GU source even though ucx did not grow enterococcus she  did have TNTC wbc on UA. She does have some IBS sxs and chronic intermittent abd pain per family so could be GI source as well but CT abd pelvis 6/27 neg for diverticulitis or abd source. The enterococcus is sensitive to amp, linezolid, levo, cipro and vanco.  Echo without vegetation. Repeat bcx neg. From 6/25  Recommendations Echo neg for vegetation and repeat cx negative from 6/25.   - would give 10 day course of abx from time of neg bcx Cont  levofloxacin  to complete course of 10 days from neg cx - Stop date 7/5 Thank you very much for allowing me to participate in the care of this patient. Please call with questions.   Cash Duce   02/21/2015, 2:48 PM

## 2015-02-21 NOTE — Plan of Care (Signed)
Problem: Discharge Progression Outcomes Goal: Other Discharge Outcomes/Goals 1. C/o abdominal pain relieved by PRN Norco.   2. Hemodynamically:               -VSS, afebrile, O2 at 2L [place] keeping sats in high 90's. Lung sounds diminished. SOB w/ exertion 3. Activity: Pt remained in bed throughout shift with personal sitter at bedside for immediate assistance   4. Diet: Tolerating diet without problems - eating well with family help.

## 2015-02-21 NOTE — Clinical Social Work Note (Signed)
CSW spoke with MD, pt is not ready for discharge today. CSW updated facility. CSW will continue to follow.   Dede QuerySarah Santino Kinsella, MSW, LCSW Clinical Social Worker  [phone]

## 2015-02-21 NOTE — Progress Notes (Signed)
   02/21/15 1300  Clinical Encounter Type  Visited With Patient;Health care provider  Visit Type Initial  Consult/Referral To Chaplain  Visited with patient and her caregiver.  Pt was non verbal and had her eyes shut during my visit.  Her caregiver, who has been with the pt for a year now, told me that the pt is not feeling well today.  The caregiver also told me that she is a Education administratorcertified nursing assistant and has enjoyed being with her patient.  Caregiver thanked me for my visit.  Asbury Automotive GroupChaplain Tyerra Loretto-pager [phone]

## 2015-02-21 NOTE — Procedures (Signed)
Successful RT THORACENTESIS 1.1 L REMOVED NO COMP STABLE CXR PENDING FULL REPORT IN PACS

## 2015-02-21 NOTE — OR Nursing (Signed)
Dr Miles CostainShick notified of Pt on Coumadin and PT/INR, proceed with thoracentesis

## 2015-02-21 NOTE — Progress Notes (Signed)
Initial Nutrition Assessment  INTERVENTION:  Meals and Snacks: Cater to patient preferences; SLP following Medical Food Supplement Therapy: will recommend Honey thick Mighty Shakes on meal trays TID for added nutrition (each shake provides 300kcals and 9g protein)    NUTRITION DIAGNOSIS:  Swallowing difficulty related to dysphagia as evidenced by  (diet order, SLP following).  GOAL:  Patient will meet greater than or equal to 90% of their needs   MONITOR:   (Energy Intake, Digestive System, Pulmonary Profile)  REASON FOR ASSESSMENT:   (RD Screen, Honey thick liquids)    ASSESSMENT:  Pt admitted with acute respiratory failure and sepsis PMHx:  Past Medical History  Diagnosis Date  . Coronary artery disease     s/p CABG 1998 and s/p stents placed in the right coronary artery in 01/2008  . Atrial fibrillation   . Carotid stenosis   . Unspecified diastolic heart failure   . Hypertension   . Hyperlipidemia   . Pulmonary hypertension   . Renal artery stenosis     bilateral  . Chronic renal insufficiency   . Diverticular disease   . IBS (irritable bowel syndrome)   . Anemia   . Endometriosis   . Hypothermia   . CHF (congestive heart failure)     Diet Order:  DIET - DYS 1 Room service appropriate?: Yes; Fluid consistency:: Honey Thick  Current Nutrition: Pt slept through breakfast this am. Family to reorder when she awakens. Per I/O chart pt intake 58% of meals since admission.  Food/Nutrition-Related History: Pt family reports pt eating very well PTA, 3 meals per day and usually eats 100%. Family reports pt eats a large breakfast every day including eggs, grits, sausage, 2 orange juices, milk and coffee every morning. No supplements usually.   Medications: colace, Lasix, ativan, coumadin  Electrolyte/Renal Profile and Glucose Profile:   Recent Labs Lab 02/16/15 2159 02/18/15 0309 02/19/15 0439 02/21/15 0422  NA 134*  --   --   --   K 4.6  --   --   --    CL 94*  --   --   --   CO2 29  --   --   --   BUN 35*  --   --   --   CREATININE 1.00 0.85 0.73 0.73  CALCIUM 8.5*  --   --   --   GLUCOSE 141*  --   --   --    Protein Profile:   Recent Labs Lab 02/16/15 2159  ALBUMIN 3.5    Gastrointestinal Profile: Last BM: 6/28   Nutrition-Focused Physical Findings:  Unable to complete Nutrition-Focused physical exam at this time.   Weight Change: Current weight 166lbs. Since admission weight 163-180lbs. Noted weight in past years averaging 120lbs.  Filed Weights   02/19/15 0404 02/20/15 0500 02/21/15 0516  Weight: 177 lb 14.4 oz (80.695 kg) 181 lb (82.101 kg) 166 lb 9.6 oz (75.569 kg)  Anthropometrics:  Height:  Ht Readings from Last 1 Encounters:  02/17/15  (1.626 m)    Weight:  Wt Readings from Last 1 Encounters:  02/21/15 166 lb 9.6 oz (75.569 kg)    Ideal Body Weight:   54.5kg  Wt Readings from Last 10 Encounters:  02/21/15 166 lb 9.6 oz (75.569 kg)  01/08/12 122 lb (55.339 kg)  09/30/11 127 lb 13.9 oz (58 kg)  06/12/11 111 lb (50.349 kg)  11/13/10 127 lb (57.607 kg)  02/07/10 127 lb (57.607 kg)  07/31/09 117 lb (53.071  kg)  07/26/09 112 lb (50.803 kg)  12/28/08 125 lb (56.7 kg)    BMI:  Body mass index is 28.58 kg/(m^2).  Estimated Nutritional Needs:  Kcal:  1260-1490kcals, BEE: 955kcals, TEE: (IF 1.1-1.3)(AF 1.2) using IBW of 54.5kg  Protein:  55-65g protein (1.0-1.2g/kg) using IBW of 54.5kg  Fluid:  1363-168635mL of fluid (25-3230mL/kg) using IBW of 54.5kg  Skin:  Reviewed, no issues  EDUCATION NEEDS:  No education needs identified at this time   MODERATE Care Level  Leda QuailAllyson Della Scrivener, RD, LDN Pager [phone]

## 2015-02-21 NOTE — Progress Notes (Addendum)
[facility] - [place] [facility]   PATIENT NAME: [name]    MR#:  [id number]  DATE OF BIRTH:  Feb 23, 1923  SUBJECTIVE:  CHIEF COMPLAINT:   Chief Complaint  Patient presents with  . Shortness of Breath  does not fel well today, can not ROS, very SOB, tachypneic, dyspneic.  More alert today . Not able to review systems. Repeated blood cultures showed no growth in 2 days. Initial blood cultures revealed enterococcus faecalis sensitive to ampicillin as well as linezolid,  levofloxacin and ciprofloxacin according to Dr. Sampson Goon, levofloxacin was recommended to be on discharge by Dr. Sampson Goon. Echocardiogram showed no valvular abnormalities.   REVIEW OF SYSTEMS:   ROS Unable to perform due to dementia  DRUG ALLERGIES:   Allergies  Allergen Reactions  . Codeine Phosphate     REACTION: myalgia  . Penicillins     REACTION: questionable    VITALS:  Blood pressure 162/86, pulse 90, temperature 97.6 F (36.4 C), temperature source Oral, resp. rate 18, height  (1.626 m), weight 75.569 kg (166 lb 9.6 oz), SpO2 91 %.  PHYSICAL EXAMINATION:  GENERAL:  81 y.o.-year-old patient lying in the bed with no acute distress. Somnolent, intermittently EYES: Pupils equal, round, reactive to light and accommodation. No scleral icterus. Extraocular muscles intact.  HEENT: Head atraumatic, normocephalic. Oropharynx and nasopharynx clear. MMdry NECK:  Supple, no jugular venous distention. No thyroid enlargement, no tenderness.  LUNGS: Normal breath sounds bilaterally, no wheezing, rales, rhonchi or crepitation. No use of accessory muscles of respiration. nasal canula . Diminished breath sounds bilaterally at bases. Poor effort CARDIOVASCULAR: S1, S2 normal . Irregularly irregular. No murmurs, rubs, or gallops.  ABDOMEN: Soft, nontender, nondistended. Bowel sounds present. No organomegaly or mass.  EXTREMITIES: No pedal edema, cyanosis, or clubbing.  cold NEUROLOGIC: Cranial nerves II through XII are intact. Moves extremities spontaneously PSYCHIATRIC: The patient is alert and calm SKIN: No obvious rash, lesion, or ulcer. Left lower extremity anterior shin has 2 huge areas of injury, which look like caked blood. No erythema, increased warmth , swelling or drainage was noted beneath these plaques.    LABORATORY PANEL:   CBC  Recent Labs Lab 02/19/15 0439  WBC 7.8  HGB 9.8*  HCT 29.8*  PLT 133*   ------------------------------------------------------------------------------------------------------------------  Chemistries   Recent Labs Lab 02/16/15 2159  02/21/15 0422  NA 134*  --   --   K 4.6  --   --   CL 94*  --   --   CO2 29  --   --   GLUCOSE 141*  --   --   BUN 35*  --   --   CREATININE 1.00  < > 0.73  CALCIUM 8.5*  --   --   AST 26  --   --   ALT 11*  --   --   ALKPHOS 195*  --   --   BILITOT 1.1  --   --   < > = values in this interval not displayed. ------------------------------------------------------------------------------------------------------------------  Cardiac Enzymes  Recent Labs Lab 02/16/15 2159  TROPONINI 0.03   ------------------------------------------------------------------------------------------------------------------  RADIOLOGY:  Ct Abdomen Pelvis W Contrast  02/20/2015   CLINICAL DATA:  81 year old with Enterococcus septicemia, likely GU source. Abdominal pain. History of dementia, chronic kidney disease, atrial ablation and irritable bowel syndrome. Initial encounter.  EXAM: CT ABDOMEN AND PELVIS WITH CONTRAST  TECHNIQUE: Multidetector CT imaging of the abdomen and pelvis was performed using the standard protocol  following bolus administration of intravenous contrast.  CONTRAST:  OMNIPAQUE IOHEXOL 300 MG/ML  SOLN  COMPARISON:  Portable chest 02/16/2015. CTs of the chest and abdomen 09/28/2011.  FINDINGS: Lower chest: There is marked cardiac enlargement status post median  sternotomy. There is a moderate size right pleural effusion which is incompletely visualized. The visualized right lower lobe is completely collapsed. The left lung base is clear. There is no significant left pleural or pericardial effusion.  Hepatobiliary: No focal hepatic abnormalities identified. The IVC and hepatic veins are distended with reflux of contrast suggesting right heart failure. There is dependent sludge or small gallstones within the gallbladder lumen. There is possible mild gallbladder wall thickening, nonspecific. No significant biliary dilatation.  Pancreas: Stable appearance. Previously demonstrated pancreas divisum not well seen. No pancreatic ductal dilatation or surrounding inflammatory changes.  Spleen: Normal in size without focal abnormality.  Adrenals/Urinary Tract: Both adrenal glands appear normal.The left kidney demonstrates chronic atrophy and cortical thinning. The right kidney is normal in size with mild cortical scarring in the upper pole. There are small wedge-shaped areas of decreased attenuation in the upper and lower poles which could reflect infarcts or pyelonephritis. There is no perinephric soft tissue stranding or hydronephrosis. The bladder is not optimally evaluated due to incomplete distension and artifact from the patient's right total hip arthroplasty.  Stomach/Bowel: No evidence of bowel wall thickening, distention or surrounding inflammatory change.There are fairly extensive diverticular changes throughout the distal colon without focal surrounding inflammation. A small amount of free pelvic fluid is present.  Vascular/Lymphatic: There are no enlarged abdominal or pelvic lymph nodes. There is diffuse atherosclerosis of the aorta, its branches and iliac arteries. The IVC and iliac veins are distended.  Reproductive: Previous hysterectomy.  No evidence of adnexal mass.  Other: Generalized anasarca with diffuse edema throughout the subcutaneous and mesenteric fat.   Musculoskeletal: No acute or significant osseous findings. Chronic superior endplate compression deformity at L3, lumbar degenerative changes and previous right total hip arthroplasty noted.  IMPRESSION: 1. Anasarca with generalized soft tissue edema, mild ascites and at least moderate size right pleural effusion. Findings are likely secondary to right heart failure given the patient's cardiomegaly, distension of the hepatic veins and IVC and reflux of contrast into the liver. 2. No obvious worse for infection identified within the abdomen, although mild right-sided pyelonephritis difficult to exclude. Chronic left renal atrophy appears unchanged. No hydronephrosis. 3. Small gallstones versus sludge in the gallbladder lumen with mild gallbladder wall thickening, nonspecific given the anasarca. 4. Right lower lobe collapse may be secondary to the pleural effusion. Chest radiographic follow up recommended. Thoracentesis should be considered. 5. Distal colonic diverticulosis. 6. Diffuse atherosclerosis.   Electronically Signed   By: Carey Bullocks M.D.   On: 02/20/2015 16:41    EKG:   Orders placed or performed during the hospital encounter of 02/16/15  . ED EKG 12-Lead  . ED EKG 12-Lead  . EKG 12-Lead  . EKG 12-Lead    ASSESSMENT AND PLAN:    This is an 81 year old Caucasian female admitted for sepsis.  1. Enterococcus faecalis Sepsis , off vancomycin IV, levofloxacin orally , appreciate Dr. Jarrett Ables input .  Echocardiogram showed no valvular abnormalities,  Repeated blood cultures are negative so far. Exact site of origin of infection is unclear, although Dr. Sampson Goon feels it could be gastrointestinal versus genitourinary site. Not likely wounds in the left lower extremity anterior shin,, per ID.Discharge to skilled nursing facility when  clinically stable on Levaquin PO  to continue for 10 days after negative cx  2. Pyuria, likely  UTI per ID, although cultures are positive for 60,000  colony-forming units of Streptococcus group B, Dr. Sampson GoonFitzgerald recommended levofloxacin to be continued ,  Foley catheter is out. Postvoid residual 250 ml.   3. Acute Respiratory failure with hypoxia: recurrent, likely due to CHF, acute on chronic diastolic, lasix IV, dc IVF, planned thoracentesis on the R, but INR is high, may not be able to perform. Getting ABG's.  - briefly on BiPap, now on 1 L of oxygen through nasal cannula  4. Hypotension:  - resolved with IVF, now off IV fluids, now hypertensive. Continue metoprolol, resumed hydralazine  5. Acute on chronic CHF: Diastolic.  - no current exacerbation. Ejection fraction of 45-50% on the current echocardiogram, hypokinesis of anterior as well as anteroseptal myocardium with moderate mitral valve regurgitation, and severe tricuspid regurgitation, also elevated pulmonary pressures,  continue Lasix IV, following ins/outs.  6. Coronary artery disease: Stable continue secondary prevention  7. DVT prophylaxis: coumadin per pharmacy  8. GI prophylaxis: None  9 . Chronic atrial fibrillation on Coumadin therapy at home. Continue Coumadin. Heart rate is well controlled on metoprolol, INR is still  subtherapeutic,  Coumadin per pharmacy, although patient may need thoracenthesis .   10. CKD st 3, stable overall, follow with diuretics The patient is DO NOT RESUSCITATE.   All the records are reviewed and case discussed with Care Management/Social Workerr. Management plans discussed with the patient, family and they are in agreement.  TOTAL TIME TAKING CARE OF THIS PATIENT: 40 minutes.  Prolonged discussion with patient's husband about discharge planning, treatment plan  Greater than 50% of time spent coordinating care and counseling daughter/care givers.  Katharina CaperVAICKUTE,Anona Giovannini M.D on 02/21/2015 at 12:01 PM  Between 7am to 6pm - Pager - [phone]  After 6pm go to [email or web address] - password [email or web address]  Fabio Neighbors[facility]  Office   [phone]  CC: Primary care physician; Pcp Not In System

## 2015-02-21 NOTE — Progress Notes (Signed)
ANTICOAGULATION CONSULT NOTE - Follow Up  Pharmacy Consult for Warfarin Indication: atrial fibrillation  Allergies  Allergen Reactions  . Codeine Phosphate     REACTION: myalgia  . Penicillins     REACTION: questionable    Patient Measurements: Height: 5\' 4"  (162.6 cm) Weight: 166 lb 9.6 oz (75.569 kg) IBW/kg (Calculated) : 54.7   Vital Signs: Temp: 97.6 F (36.4 C) (06/28 0449) Temp Source: Oral (06/28 0449) BP: 162/86 mmHg (06/28 0455) Pulse Rate: 90 (06/28 0453)  Labs:  Recent Labs  02/19/15 0439 02/20/15 0438 02/21/15 0422  HGB 9.8*  --   --   HCT 29.8*  --   --   PLT 133*  --   --   LABPROT 16.8* 19.0* 20.1*  INR 1.34 1.57 1.69  CREATININE 0.73  --  0.73    Estimated Creatinine Clearance: 47.5 mL/min (by C-G formula based on Cr of 0.73).   Medical History: Past Medical History  Diagnosis Date  . Coronary artery disease     s/p CABG 1998 and s/p stents placed in the right coronary artery in 01/2008  . Atrial fibrillation   . Carotid stenosis   . Unspecified diastolic heart failure   . Hypertension   . Hyperlipidemia   . Pulmonary hypertension   . Renal artery stenosis     bilateral  . Chronic renal insufficiency   . Diverticular disease   . IBS (irritable bowel syndrome)   . Anemia   . Endometriosis   . Hypothermia   . CHF (congestive heart failure)     Medications:  Scheduled:  . acidophilus  1 capsule Oral Daily  . ALIGN  4 mg Oral Daily  . aspirin EC  81 mg Oral Daily  . docusate sodium  200 mg Oral Daily  . DULoxetine  30 mg Oral Daily  . levofloxacin  500 mg Oral Daily  . levothyroxine  50 mcg Oral Daily  . LORazepam  0.25 mg Oral BID WC  . LORazepam  0.25 mg Oral QPM  . metoprolol tartrate  25 mg Oral BID  . pravastatin  40 mg Oral Daily  . sodium chloride  3 mL Intravenous Q12H  . torsemide  10 mg Oral QODAY  . traZODone  50 mg Oral QHS  . warfarin  3 mg Oral Daily  . Warfarin - Pharmacist Dosing Inpatient   Does not apply  q1800    Assessment: 81 yo female on Warfarin for Atrial Fibrillaton. Home dose is 2.5mg  daily.  Goal of Therapy:  INR 2-3 Monitor platelets by anticoagulation protocol: Yes   Plan:  INR = 1.68. Will continue current order of warfarin 3 mg PO q1800. PT/INR with am labs.   Demetrius Charityeldrin D. Gerber Penza, PharmD Clinical Pharmacist 02/21/2015

## 2015-02-22 ENCOUNTER — Inpatient Hospital Stay: Payer: Medicare Other

## 2015-02-22 DIAGNOSIS — J9601 Acute respiratory failure with hypoxia: Secondary | ICD-10-CM

## 2015-02-22 DIAGNOSIS — Z9889 Other specified postprocedural states: Secondary | ICD-10-CM

## 2015-02-22 DIAGNOSIS — N390 Urinary tract infection, site not specified: Secondary | ICD-10-CM

## 2015-02-22 DIAGNOSIS — N183 Chronic kidney disease, stage 3 unspecified: Secondary | ICD-10-CM

## 2015-02-22 DIAGNOSIS — I959 Hypotension, unspecified: Secondary | ICD-10-CM

## 2015-02-22 DIAGNOSIS — A4181 Sepsis due to Enterococcus: Secondary | ICD-10-CM

## 2015-02-22 DIAGNOSIS — I5033 Acute on chronic diastolic (congestive) heart failure: Secondary | ICD-10-CM

## 2015-02-22 LAB — CBC
HCT: 28.5 % — ABNORMAL LOW (ref 35.0–47.0)
Hemoglobin: 9.4 g/dL — ABNORMAL LOW (ref 12.0–16.0)
MCH: 31.8 pg (ref 26.0–34.0)
MCHC: 33 g/dL (ref 32.0–36.0)
MCV: 96.4 fL (ref 80.0–100.0)
PLATELETS: 134 10*3/uL — AB (ref 150–440)
RBC: 2.96 MIL/uL — ABNORMAL LOW (ref 3.80–5.20)
RDW: 16.1 % — ABNORMAL HIGH (ref 11.5–14.5)
WBC: 7.9 10*3/uL (ref 3.6–11.0)

## 2015-02-22 LAB — BASIC METABOLIC PANEL
Anion gap: 7 (ref 5–15)
BUN: 22 mg/dL — AB (ref 6–20)
CHLORIDE: 96 mmol/L — AB (ref 101–111)
CO2: 33 mmol/L — ABNORMAL HIGH (ref 22–32)
CREATININE: 0.79 mg/dL (ref 0.44–1.00)
Calcium: 8.3 mg/dL — ABNORMAL LOW (ref 8.9–10.3)
GFR calc Af Amer: >60 mL/min (ref >60)
GFR calc non Af Amer: >60 mL/min (ref >60)
Glucose, Bld: 95 mg/dL (ref 65–99)
Potassium: 4.4 mmol/L (ref 3.5–5.1)
Sodium: 136 mmol/L (ref 135–145)

## 2015-02-22 LAB — PROTIME-INR
INR: 1.95
PROTHROMBIN TIME: 22.4 s — AB (ref 11.4–15.0)

## 2015-02-22 MED ORDER — WARFARIN SODIUM 3 MG PO TABS: 3.0000 mg | ORAL_TABLET | Freq: Every day | ORAL | Status: AC

## 2015-02-22 MED ORDER — FENTANYL 12 MCG/HR TD PT72: 12.5000 ug | MEDICATED_PATCH | TRANSDERMAL | Status: AC

## 2015-02-22 MED ORDER — RISAQUAD PO CAPS: 1.0000 | ORAL_CAPSULE | Freq: Every day | ORAL | Status: AC

## 2015-02-22 MED ORDER — ALIGN 4 MG PO CAPS: 4.0000 mg | ORAL_CAPSULE | Freq: Every day | ORAL | Status: AC

## 2015-02-22 MED ORDER — LEVOFLOXACIN 500 MG PO TABS: 500.0000 mg | ORAL_TABLET | Freq: Every day | ORAL | Status: AC

## 2015-02-22 MED ORDER — DOCUSATE SODIUM 100 MG PO CAPS: 200.0000 mg | ORAL_CAPSULE | Freq: Every day | ORAL | Status: AC

## 2015-02-22 MED ORDER — TRAZODONE HCL 50 MG PO TABS: 50.0000 mg | ORAL_TABLET | Freq: Every day | ORAL | Status: AC

## 2015-02-22 MED ORDER — MORPHINE SULFATE (CONCENTRATE) 20 MG/ML PO SOLN: 5.0000 mg | ORAL | Status: AC | PRN

## 2015-02-22 MED ORDER — DULOXETINE HCL 30 MG PO CPEP: 30.0000 mg | ORAL_CAPSULE | Freq: Every day | ORAL | Status: DC

## 2015-02-22 MED ORDER — TEMAZEPAM 15 MG PO CAPS: 15.0000 mg | ORAL_CAPSULE | Freq: Every day | ORAL | Status: AC

## 2015-02-22 MED ORDER — LORAZEPAM 0.5 MG PO TABS: 0.2500 mg | ORAL_TABLET | Freq: Four times a day (QID) | ORAL | Status: AC | PRN

## 2015-02-22 MED ORDER — ONDANSETRON HCL 4 MG PO TABS: 4.0000 mg | ORAL_TABLET | Freq: Four times a day (QID) | ORAL | Status: AC | PRN

## 2015-02-22 MED ORDER — HYDROCODONE-ACETAMINOPHEN 5-325 MG PO TABS: 0.5000 | ORAL_TABLET | ORAL | Status: AC | PRN

## 2015-02-22 MED ORDER — NITROGLYCERIN 0.4 MG SL SUBL: 0.4000 mg | SUBLINGUAL_TABLET | SUBLINGUAL | Status: AC | PRN

## 2015-02-22 MED ORDER — METOPROLOL TARTRATE 25 MG PO TABS: 25.0000 mg | ORAL_TABLET | Freq: Two times a day (BID) | ORAL | Status: AC

## 2015-02-22 MED ORDER — HYOSCYAMINE SULFATE 0.125 MG SL SUBL: 0.1250 mg | SUBLINGUAL_TABLET | SUBLINGUAL | Status: AC | PRN

## 2015-02-22 MED ORDER — LEVOTHYROXINE SODIUM 50 MCG PO TABS: 50.0000 ug | ORAL_TABLET | Freq: Every day | ORAL | Status: AC

## 2015-02-22 MED ORDER — PRAVASTATIN SODIUM 40 MG PO TABS: 40.0000 mg | ORAL_TABLET | Freq: Every day | ORAL | Status: AC

## 2015-02-22 NOTE — Progress Notes (Signed)
Speech Language Pathology Treatment: Dysphagia  Patient Details Name: [name] BonierCharlotte R [name] MRN: [id number] DOB: 02/14/1923 Today's Date: 02/22/2015 Time: [id number] SLP Time Calculation (min) (ACUTE ONLY): 28 min  Assessment / Plan / Recommendation Clinical Impression  Pt continues to present with moderate to severe oropharyngeal dysphagia characterized by coughing with trials of thin and nectar thick liquids. Pt caregiver present and educated on pt diet and recommendation to remain on ndds1 with honey thick liquids. Caregiver gave verbal understanding. Previously she stated giving pt water and ginger ale for pleasure, st educated on aspiration risk and risk of aspiration pna. Caregiver able to state verbal understanding.   HPI     Pertinent Vitals Pain Assessment: No/denies pain  SLP Plan       Recommendations Diet recommendations: Dysphagia 1 (puree);Honey-thick liquid Liquids provided via: Cup Medication Administration: Crushed with puree Supervision: Staff to assist with self feeding Compensations: Minimize environmental distractions;Slow rate;Small sips/bites              Oral Care Recommendations: Staff/trained caregiver to provide oral care    GO     Meredith PelStacie Harris [facility] 02/22/2015, 3:22 PM

## 2015-02-22 NOTE — Discharge Instructions (Signed)
Please have protime /INR checked in 2-3 days to ensure stability and therapeutic value, thank you

## 2015-02-22 NOTE — Progress Notes (Signed)
[facility]. Iv d/cd. pts caregiver at bedside.  02 in use at 2 l [place]. No c/o/

## 2015-02-22 NOTE — Plan of Care (Signed)
Problem: Discharge Progression Outcomes Goal: Other Discharge Outcomes/Goals Outcome: Progressing Plan of care progress to goals: BP/HR stable. On 2L O2 as pt's baseline with O2 sats in the high 90's.No signs of pain nor discomfort. Alert to self only. Minimal speech. Possible discharge.

## 2015-02-22 NOTE — Clinical Social Work Note (Signed)
Pt is ready for discharge today and [facility] Pt and family are agreeable to discharge plan. Facility has received discharge information. CSW sent updated discharge summary to note medication changes. CSW also left a message with admissions coordinator. Hard copy is in the packet. RN will call report and EMS will provide transportation. CSW is signing off asno further needs identified.   Dede QuerySarah Curtez Brallier, MSW, LCSW Clinical Social Worker  [phone]

## 2015-02-22 NOTE — Discharge Summary (Addendum)
[facility] [place] [facility]   PATIENT NAME: [name]    MR#:  [id number]  DATE OF BIRTH:  1923/06/28  DATE OF ADMISSION:  02/16/2015 ADMITTING PHYSICIAN: Arnaldo Natal, MD  DATE OF DISCHARGE: No discharge date for patient encounter.  PRIMARY CARE PHYSICIAN: Pcp Not In System     ADMISSION DIAGNOSIS:  Acute systolic congestive heart failure [I50.21] Acute respiratory failure with hypoxia [J96.01] Sepsis, due to unspecified organism [A41.9]  DISCHARGE DIAGNOSIS:  Principal Problem:   Sepsis due to enterococcus Active Problems:   Sepsis   Acute respiratory failure with hypoxia   UTI (urinary tract infection)   S/P thoracentesis   Acute on chronic diastolic CHF (congestive heart failure)   Hypotension   CKD (chronic kidney disease) stage 3, GFR 30-59 ml/min   SECONDARY DIAGNOSIS:   Past Medical History  Diagnosis Date  . Coronary artery disease     s/p CABG 1998 and s/p stents placed in the right coronary artery in 01/2008  . Atrial fibrillation   . Carotid stenosis   . Unspecified diastolic heart failure   . Hypertension   . Hyperlipidemia   . Pulmonary hypertension   . Renal artery stenosis     bilateral  . Chronic renal insufficiency   . Diverticular disease   . IBS (irritable bowel syndrome)   . Anemia   . Endometriosis   . Hypothermia   . CHF (congestive heart failure)     .pro HOSPITAL COURSE:   Patient is a 81 year old female with past smoker history significant for history of dementia. CK D stage III, atrial fibrillation on Coumadin therapy at home, coronary artery disease who presents to the hospital with complaints of decreased responsiveness. In emergency room, she was also noted to be hypoxic. Upon arrival to emergency room, she required BiPAP with 60% FiO2. She became barely responsive to stimuli and admitted to the hospital due to concerns of urinary tract infection and sepsis. Initial labs revealed white  blood cell count of 11.7. Chest x-ray showed small right pleural effusion. Patient was admitted to the hospital for further evaluation. She was given IV fluids, antibiotics therapy for pyuria. Suspected urinary tract infection. Blood cultures were taken in the emergency room and the day grew Enterococcus faecalis, sensitive to ampicillin, linezolid, life levofloxacin, ciprofloxacin as well as vancomycin. Patient was seen by infectious disease specialist, Dr. Sampson Goon. Echocardiogram was performed and it showed no vegetations on the valves. Repeated blood cultures done on 02/18/2015 showed no growth in 3 days. As further workup to evaluate the source of enterococcus the patient underwent CT scan of abdomen and pelvis. It revealed anasarca, mild ascites and moderate sized right pleural effusion but no obvious source for infection identified. There is in abdomen, although mild right-sided polynephritis was difficult to exclude. Thoracentesis was recommended. Patient became more hypoxic, as time progressed, and underwent ultrasound-guided right thoracentesis on 02/21/2015. Postprocedural condition improved, as well as her oxygenation and she was weaned down to 1 L of oxygen through nasal cannula. If she feels comfortable, she will likely be discharged home to [facility] today on 02/22/2015  Discussion by problem 1. Enterococcus faecalis Sepsis , continue levofloxacin orally for 7 more days to complete 10 day course after negative blood cultures, appreciate Dr. Jarrett Ables input . Echocardiogram showed no valvular abnormalities, Repeated blood cultures are negative so far. Exact site of origin of infection is unclear, although Dr. Sampson Goon feels it could be gastrointestinal versus genitourinary site. Not likely wounds  in the left lower extremity anterior shin,, per ID.Discharge to skilled nursing facility likely today. If patient is comfortable, as time progresses  2. Pyuria, likely UTI per ID, although  cultures were positive for 60,000 colony-forming units of Streptococcus group B, Dr. Sampson Goon recommended levofloxacin to be continued , Foley catheter is out. Postvoid residual 250 ml.   3. Acute Respiratory failure with hypoxia: recurrent, likely due to CHF, acute on chronic diastolic, continue lasix IV while in the hospital, of IVF, status post thoracentesis on the R yesterday 28th of June 2016, continue diuretics, was briefly on BiPap, now on 1 L of oxygen through nasal cannula  4. Hypotension:  - resolved with IVF, now off IV fluids, now hypertensive. Continue metoprolol, resumed hydralazine. Patient's blood pressure is good  5. Acute on chronic CHF: Diastolic.  - no current exacerbation. Ejection fraction of 45-50% on the current echocardiogram, hypokinesis of anterior as well as anteroseptal myocardium with moderate mitral valve regurgitation, and severe tricuspid regurgitation, also elevated pulmonary pressures, continue Lasix IV while in the hospital. Continue torsemide as outpatient, following ins/outs, oxygenation, and weight. Follow-up with Dr. Lewie Loron as outpatient  6. Coronary artery disease: Stable continue secondary prevention with metoprolol, aspirin as well as statin  7. DVT prophylaxis: coumadin per pharmacy while in the hospital. Continue follow pro time closely. Check INR in approximately 2-3 days after discharge to ensure stability and therapeutic levels  8. GI prophylaxis: None. May benefit from long-term acid suppressant therapy.   9 . Chronic atrial fibrillation on Coumadin therapy at home. Continue Coumadin. Heart rate is controlled on metoprolol, INR is close to. Therapeutic, follow pro time INR in the next 2-3 days after discharge to ensure stability and therapeutic levels.   10. CKD st 3, stable overall, follow with diuretics The patient is DO NOT RESUSCITATE.   Patient sedating medication doses have been all decreased. Please follow for any withdrawal symptoms  and advanced medications depending on patient's needs  DISCHARGE CONDITIONS:   Stable  CONSULTS OBTAINED:  Treatment Team:  Clydie Braun, MD  DRUG ALLERGIES:   Allergies  Allergen Reactions  . Codeine Phosphate     REACTION: myalgia  . Penicillins     REACTION: questionable    DISCHARGE MEDICATIONS:   Current Discharge Medication List    START taking these medications   Details  !! acidophilus (RISAQUAD) CAPS capsule Take 1 capsule by mouth daily. Qty: 30 capsule, Refills: 3    hyoscyamine (LEVSIN SL) 0.125 MG SL tablet Place 1 tablet (0.125 mg total) under the tongue every 4 (four) hours as needed. Qty: 30 tablet, Refills: 0    levofloxacin (LEVAQUIN) 500 MG tablet Take 1 tablet (500 mg total) by mouth daily. Qty: 7 tablet, Refills: 0    ondansetron (ZOFRAN) 4 MG tablet Take 1 tablet (4 mg total) by mouth every 6 (six) hours as needed for nausea. Qty: 20 tablet, Refills: 0    !! Probiotic Product (ALIGN) 4 MG CAPS Take 4 mg by mouth daily. Qty: 30 capsule, Refills: 3    warfarin (COUMADIN) 3 MG tablet Take 1 tablet (3 mg total) by mouth daily. Qty: 30 tablet, Refills: 3     !! - Potential duplicate medications found. Please discuss with provider.    CONTINUE these medications which have CHANGED   Details  docusate sodium (COLACE) 100 MG capsule Take 2 capsules (200 mg total) by mouth daily. Qty: 10 capsule, Refills: 0    fentaNYL (DURAGESIC - DOSED MCG/HR) 12 MCG/HR  Place 1 patch (12.5 mcg total) onto the skin every 3 (three) days. Qty: 10 patch, Refills: 0    HYDROcodone-acetaminophen (NORCO/VICODIN) 5-325 MG per tablet Take 0.5 tablets by mouth every 4 (four) hours as needed for moderate pain. Qty: 40 tablet, Refills: 0    levothyroxine (SYNTHROID, LEVOTHROID) 50 MCG tablet Take 1 tablet (50 mcg total) by mouth daily. Qty: 30 tablet, Refills: 3    LORazepam (ATIVAN) 0.5 MG tablet Take 0.5 tablets (0.25 mg total) by mouth every 6 (six) hours as needed  for anxiety. Qty: 60 tablet, Refills: 0    metoprolol tartrate (LOPRESSOR) 25 MG tablet Take 1 tablet (25 mg total) by mouth 2 (two) times daily. Qty: 60 tablet, Refills: 3    morphine (ROXANOL) 20 MG/ML concentrated solution Take 0.25-0.5 mLs (5-10 mg total) by mouth every hour as needed for severe pain or shortness of breath (restlessness). Qty: 30 mL, Refills: 0    nitroGLYCERIN (NITROSTAT) 0.4 MG SL tablet Place 1 tablet (0.4 mg total) under the tongue every 5 (five) minutes as needed for chest pain. Qty: 30 tablet, Refills: 12    pravastatin (PRAVACHOL) 40 MG tablet Take 1 tablet (40 mg total) by mouth daily. Qty: 30 tablet, Refills: 3    temazepam (RESTORIL) 15 MG capsule Take 1 capsule (15 mg total) by mouth at bedtime. Qty: 30 capsule, Refills: 2    traZODone (DESYREL) 50 MG tablet Take 1 tablet (50 mg total) by mouth at bedtime. Qty: 30 tablet, Refills: 2      CONTINUE these medications which have NOT CHANGED   Details  acetaminophen (TYLENOL) 325 MG tablet Take 650 mg by mouth every 4 (four) hours as needed for mild pain or fever.    acetaminophen (TYLENOL) 650 MG suppository Place 650 mg rectally every 4 (four) hours as needed for mild pain or fever.    aspirin 81 MG EC tablet Take 81 mg by mouth daily.      atropine 1 % ophthalmic solution Place 1-2 drops under the tongue every 4 (four) hours as needed (secretions).    chlorproMAZINE (THORAZINE) 50 MG tablet Take 50 mg by mouth every 4 (four) hours as needed (restlessness, yelling out, agitation).    clotrimazole-betamethasone (LOTRISONE) cream Apply 1 application topically 2 (two) times daily as needed (rash). Apply a thin film to back and buttocks    docusate (COLACE) 50 MG/5ML liquid Take 100 mg by mouth at bedtime.    haloperidol (HALDOL) 1 MG tablet Take 1 mg by mouth every 4 (four) hours as needed for agitation.    hydrALAZINE (APRESOLINE) 10 MG tablet Take 10 mg by mouth daily.    hydrocortisone cream 1 %  Apply 1 application topically 4 (four) times daily as needed for itching. Apply a thin film    OLANZapine (ZYPREXA) 5 MG tablet Take 5 mg by mouth at bedtime.    pantoprazole sodium (PROTONIX) 40 mg/20 mL PACK Take 20 mg by mouth daily.    polyvinyl alcohol-povidone (HYPOTEARS) 1.4-0.6 % ophthalmic solution Place 2 drops into both eyes 4 (four) times daily as needed (dry irritated eyes).    !! Probiotic Product (FLORA-Q PO) Take 1 capsule by mouth 2 (two) times daily.     promethazine (PHENERGAN) 25 MG tablet Take 25 mg by mouth every 4 (four) hours as needed for nausea or vomiting.    senna-docusate (SENOKOT-S) 8.6-50 MG per tablet Take 2 tablets by mouth 2 (two) times daily as needed for mild constipation.  Skin Protectants, Misc. (ENDIT EX) Apply 1 application topically 3 (three) times daily as needed (skin irritation). Apply a liberal amount to areas on coccyx    sodium chloride (OCEAN) 0.65 % SOLN nasal spray Place 2 sprays into both nostrils as needed for congestion.    torsemide (DEMADEX) 20 MG tablet Take 20 mg by mouth daily.    venlafaxine (EFFEXOR) 37.5 MG tablet Take 37.5 mg by mouth 2 (two) times daily.     !! - Potential duplicate medications found. Please discuss with provider.    STOP taking these medications     magnesium hydroxide (MILK OF MAGNESIA) 400 MG/5ML suspension      DULoxetine (CYMBALTA) 30 MG capsule          DISCHARGE INSTRUCTIONS:    Patient is to follow-up with her primary care physician in the facility [facility], patient is to follow up with cardiologist, Dr. Lewie Loron, please have pro time INR checked in the next 2 or 3 days after discharge to ensure stability and therapeutic levels  If you experience worsening of your admission symptoms, develop shortness of breath, life threatening emergency, suicidal or homicidal thoughts you must seek medical attention immediately by calling 911 or calling your MD immediately  if symptoms less severe.  You  Must read complete instructions/literature along with all the possible adverse reactions/side effects for all the Medicines you take and that have been prescribed to you. Take any new Medicines after you have completely understood and accept all the possible adverse reactions/side effects.   Please note  You were cared for by a hospitalist during your hospital stay. If you have any questions about your discharge medications or the care you received while you were in the hospital after you are discharged, you can call the unit and asked to speak with the hospitalist on call if the hospitalist that took care of you is not available. Once you are discharged, your primary care physician will handle any further medical issues. Please note that NO REFILLS for any discharge medications will be authorized once you are discharged, as it is imperative that you return to your primary care physician (or establish a relationship with a primary care physician if you do not have one) for your aftercare needs so that they can reassess your need for medications and monitor your lab values.    Today   CHIEF COMPLAINT:   Chief Complaint  Patient presents with  . Shortness of Breath    HISTORY OF PRESENT ILLNESS:  Tira Lafferty  is a 81 y.o. female with a known history of multiple medical problems including dementia, A. fib. CK D who presents to the hospital with altered mental status. Brief episode of hypoxia and was noted to have urinary tract infection and sepsis Discussion by problem 1. Enterococcus faecalis Sepsis , continue levofloxacin orally for 7 more days to complete 10 day course after negative blood cultures, appreciate Dr. Jarrett Ables input . Echocardiogram showed no valvular abnormalities, Repeated blood cultures are negative so far. Exact site of origin of infection is unclear, although Dr. Sampson Goon feels it could be gastrointestinal versus genitourinary site. Not likely wounds in the left  lower extremity anterior shin,, per ID.Discharge to skilled nursing facility likely today. If patient is comfortable, as time progresses  2. Pyuria, likely UTI per ID, although cultures were positive for 60,000 colony-forming units of Streptococcus group B, Dr. Sampson Goon recommended levofloxacin to be continued , Foley catheter is out. Postvoid residual 250 ml.   3. Acute  Respiratory failure with hypoxia: recurrent, likely due to CHF, acute on chronic diastolic, continue lasix IV while in the hospital, of IVF, status post thoracentesis on the R yesterday 28th of June 2016, continue diuretics, was briefly on BiPap, now on 1 L of oxygen through nasal cannula  4. Hypotension:  - resolved with IVF, now off IV fluids, now hypertensive. Continue metoprolol, resumed hydralazine. Patient's blood pressure is good  5. Acute on chronic CHF: Diastolic.  - no current exacerbation. Ejection fraction of 45-50% on the current echocardiogram, hypokinesis of anterior as well as anteroseptal myocardium with moderate mitral valve regurgitation, and severe tricuspid regurgitation, also elevated pulmonary pressures, continue Lasix IV while in the hospital. Continue torsemide as outpatient, following ins/outs, oxygenation, and weight. Follow-up with Dr. Lewie LoronGolan as outpatient  6. Coronary artery disease: Stable continue secondary prevention with metoprolol, aspirin as well as statin  7. DVT prophylaxis: coumadin per pharmacy while in the hospital. Continue follow pro time closely. Check INR in approximately 2-3 days after discharge to ensure stability and therapeutic levels  8. GI prophylaxis: None. May benefit from long-term acid suppressant therapy.   9 . Chronic atrial fibrillation on Coumadin therapy at home. Continue Coumadin. Heart rate is controlled on metoprolol, INR is close to. Therapeutic, follow pro time INR in the next 2-3 days after discharge to ensure stability and therapeutic levels.   10. CKD st 3,  stable overall, follow with diuretics The patient is DO NOT RESUSCITATE.   Of note, patient will have all her sedating medications' doses decreased. Follow for withdrawal symptoms   VITAL SIGNS:  Blood pressure 125/46, pulse 90, temperature 97.9 F (36.6 C), temperature source Oral, resp. rate 16, height 5\' 4"  (1.626 m), weight 72.167 kg (159 lb 1.6 oz), SpO2 100 %.  I/O:    Intake/Output Summary (Last 24 hours) at 02/22/15 1328 Last data filed at 02/22/15 0900  Gross per 24 hour  Intake    600 ml  Output      0 ml  Net    600 ml    PHYSICAL EXAMINATION:  GENERAL:  81 y.o.-year-old patient lying in the bed with no acute distress. Some somnolent and has somewhat cyanotic lips, breathing is calm and patient is comfortable EYES: Pupils equal, round, reactive to light and accommodation. No scleral icterus. Extraocular muscles intact.  HEENT: Head atraumatic, normocephalic. Oropharynx and nasopharynx clear.  NECK:  Supple, no jugular venous distention. No thyroid enlargement, no tenderness.  LUNGS: Normal breath sounds bilaterally, no wheezing, rales,rhonchi or crepitation. No use of accessory muscles of respiration.  CARDIOVASCULAR: S1, S2 normal. No murmurs, rubs, or gallops.  ABDOMEN: Soft, non-tender, non-distended. Bowel sounds present. No organomegaly or mass.  EXTREMITIES: No pedal edema, cyanosis, or clubbing.  NEUROLOGIC: Cranial nerves II through XII are intact. Muscle strength 5/5 in all extremities. Sensation intact. Gait not checked.  PSYCHIATRIC: The patient is alert and oriented x 3.  SKIN: No obvious rash, lesion, or ulcer.   DATA REVIEW:   CBC  Recent Labs Lab 02/22/15 0433  WBC 7.9  HGB 9.4*  HCT 28.5*  PLT 134*    Chemistries   Recent Labs Lab 02/16/15 2159  02/22/15 0433  NA 134*  < > 136  K 4.6  < > 4.4  CL 94*  < > 96*  CO2 29  < > 33*  GLUCOSE 141*  < > 95  BUN 35*  < > 22*  CREATININE 1.00  < > 0.79  CALCIUM 8.5*  < >  8.3*  AST 26  --    --   ALT 11*  --   --   ALKPHOS 195*  --   --   BILITOT 1.1  --   --   < > = values in this interval not displayed.  Cardiac Enzymes  Recent Labs Lab 02/16/15 2159  TROPONINI 0.03    Microbiology Results  Results for orders placed or performed during the hospital encounter of 02/16/15  MRSA PCR Screening     Status: None   Collection Time: 02/16/15  9:37 PM  Result Value Ref Range Status   MRSA by PCR NEGATIVE NEGATIVE Final    Comment:        The GeneXpert MRSA Assay (FDA approved for NASAL specimens only), is one component of a comprehensive MRSA colonization surveillance program. It is not intended to diagnose MRSA infection nor to guide or monitor treatment for MRSA infections.   Blood Culture (routine x 2)     Status: None   Collection Time: 02/16/15 10:00 PM  Result Value Ref Range Status   Specimen Description BLOOD  Final   Special Requests NONE  Final   Culture  Setup Time   Final    GRAM POSITIVE COCCI IN BOTH AEROBIC AND ANAEROBIC BOTTLES CRITICAL RESULT CALLED TO, READ BACK BY AND VERIFIED WITH: BRITTNEY KILLINGSWORTH ON 02/17/15 AT 1325 BY JEF    Culture   Final    ENTEROCOCCUS FAECALIS IN BOTH AEROBIC AND ANAEROBIC BOTTLES    Report Status 02/19/2015 FINAL  Final   Organism ID, Bacteria ENTEROCOCCUS FAECALIS  Final      Susceptibility   Enterococcus faecalis - MIC*    AMPICILLIN <=2 SENSITIVE Sensitive     LINEZOLID 2 SENSITIVE Sensitive     * ENTEROCOCCUS FAECALIS  Urine culture     Status: None (Preliminary result)   Collection Time: 02/16/15 10:35 PM  Result Value Ref Range Status   Specimen Description URINE, CLEAN CATCH  Final   Special Requests NONE  Final   Culture   Final    60,000 COLONIES/ml GROUP B STREP(S.AGALACTIAE)ISOLATED Virtually 100% of S. agalactiae (Group B) strains are susceptible to Penicillin.  For Penicillin-allergic patients, Erythromycin (85-95% sensitive) and Clindamycin (80% sensitive) are drugs of choice. Contact  microbiology lab to request sensitivities if  needed within 7 days.    Report Status PENDING  Incomplete  Blood Culture (routine x 2)     Status: None   Collection Time: 02/16/15 10:55 PM  Result Value Ref Range Status   Specimen Description BLOOD  Final   Special Requests NONE  Final   Culture  Setup Time   Final    GRAM POSITIVE COCCI IN BOTH AEROBIC AND ANAEROBIC BOTTLES CRITICAL RESULT CALLED TO, READ BACK BY AND VERIFIED WITH: BRITTNEY KILLINGSWORTH ON 02/17/15 AT 1325 BY JEF    Culture   Final    ENTEROCOCCUS FAECALIS IN BOTH AEROBIC AND ANAEROBIC BOTTLES    Report Status 02/19/2015 FINAL  Final   Organism ID, Bacteria ENTEROCOCCUS FAECALIS  Final      Susceptibility   Enterococcus faecalis - MIC*    AMPICILLIN <=2 SENSITIVE Sensitive     LINEZOLID 2 SENSITIVE Sensitive     * ENTEROCOCCUS FAECALIS  Culture, blood (routine x 2)     Status: None (Preliminary result)   Collection Time: 02/18/15  8:57 AM  Result Value Ref Range Status   Specimen Description BLOOD  Final   Special Requests LEFT ANTECUBITAL  Final  Culture NO GROWTH 4 DAYS  Final   Report Status PENDING  Incomplete  Culture, blood (routine x 2)     Status: None (Preliminary result)   Collection Time: 02/18/15  8:57 AM  Result Value Ref Range Status   Specimen Description BLOOD  Final   Special Requests RIGHT ANTECUBITAL  Final   Culture NO GROWTH 4 DAYS  Final   Report Status PENDING  Incomplete  Body fluid culture     Status: None (Preliminary result)   Collection Time: 02/21/15  4:05 PM  Result Value Ref Range Status   Specimen Description PLEURAL  Final   Special Requests NONE  Final   Gram Stain PENDING  Incomplete   Culture NO GROWTH < 24 HOURS  Final   Report Status PENDING  Incomplete    RADIOLOGY:  Dg Chest 1 View  02/21/2015   CLINICAL DATA:  Status post right thoracentesis  EXAM: CHEST  1 VIEW  COMPARISON:  02/16/2015  FINDINGS: No pneumothorax following right thoracentesis. Improvement in  the right effusion and right lower lobe aeration. Trace right effusion remains. Heart is markedly enlarged. Central vascular congestion noted. Aorta is atherosclerotic and ectatic. Prior median sternotomy noted.  IMPRESSION: Improvement in the right effusion and right lower lobe aeration following thoracentesis.  No pneumothorax.  Other chronic and postoperative findings are stable.   Electronically Signed   By: Judie Petit.  Shick M.D.   On: 02/21/2015 16:24   Ct Abdomen Pelvis W Contrast  02/20/2015   CLINICAL DATA:  81 year old with Enterococcus septicemia, likely GU source. Abdominal pain. History of dementia, chronic kidney disease, atrial ablation and irritable bowel syndrome. Initial encounter.  EXAM: CT ABDOMEN AND PELVIS WITH CONTRAST  TECHNIQUE: Multidetector CT imaging of the abdomen and pelvis was performed using the standard protocol following bolus administration of intravenous contrast.  CONTRAST:  OMNIPAQUE IOHEXOL 300 MG/ML  SOLN  COMPARISON:  Portable chest 02/16/2015. CTs of the chest and abdomen 09/28/2011.  FINDINGS: Lower chest: There is marked cardiac enlargement status post median sternotomy. There is a moderate size right pleural effusion which is incompletely visualized. The visualized right lower lobe is completely collapsed. The left lung base is clear. There is no significant left pleural or pericardial effusion.  Hepatobiliary: No focal hepatic abnormalities identified. The IVC and hepatic veins are distended with reflux of contrast suggesting right heart failure. There is dependent sludge or small gallstones within the gallbladder lumen. There is possible mild gallbladder wall thickening, nonspecific. No significant biliary dilatation.  Pancreas: Stable appearance. Previously demonstrated pancreas divisum not well seen. No pancreatic ductal dilatation or surrounding inflammatory changes.  Spleen: Normal in size without focal abnormality.  Adrenals/Urinary Tract: Both adrenal glands  appear normal.The left kidney demonstrates chronic atrophy and cortical thinning. The right kidney is normal in size with mild cortical scarring in the upper pole. There are small wedge-shaped areas of decreased attenuation in the upper and lower poles which could reflect infarcts or pyelonephritis. There is no perinephric soft tissue stranding or hydronephrosis. The bladder is not optimally evaluated due to incomplete distension and artifact from the patient's right total hip arthroplasty.  Stomach/Bowel: No evidence of bowel wall thickening, distention or surrounding inflammatory change.There are fairly extensive diverticular changes throughout the distal colon without focal surrounding inflammation. A small amount of free pelvic fluid is present.  Vascular/Lymphatic: There are no enlarged abdominal or pelvic lymph nodes. There is diffuse atherosclerosis of the aorta, its branches and iliac arteries. The IVC and iliac veins  are distended.  Reproductive: Previous hysterectomy.  No evidence of adnexal mass.  Other: Generalized anasarca with diffuse edema throughout the subcutaneous and mesenteric fat.  Musculoskeletal: No acute or significant osseous findings. Chronic superior endplate compression deformity at L3, lumbar degenerative changes and previous right total hip arthroplasty noted.  IMPRESSION: 1. Anasarca with generalized soft tissue edema, mild ascites and at least moderate size right pleural effusion. Findings are likely secondary to right heart failure given the patient's cardiomegaly, distension of the hepatic veins and IVC and reflux of contrast into the liver. 2. No obvious worse for infection identified within the abdomen, although mild right-sided pyelonephritis difficult to exclude. Chronic left renal atrophy appears unchanged. No hydronephrosis. 3. Small gallstones versus sludge in the gallbladder lumen with mild gallbladder wall thickening, nonspecific given the anasarca. 4. Right lower lobe  collapse may be secondary to the pleural effusion. Chest radiographic follow up recommended. Thoracentesis should be considered. 5. Distal colonic diverticulosis. 6. Diffuse atherosclerosis.   Electronically Signed   By: Carey Bullocks M.D.   On: 02/20/2015 16:41   Dg Chest Port 1 View  02/22/2015   CLINICAL DATA:  Post thoracentesis  EXAM: PORTABLE CHEST - 1 VIEW  COMPARISON:  02/21/2015  FINDINGS: Prior CABG. There is cardiomegaly. Vascular congestion is stable. Right mid and lower lung airspace opacity has increased. No confluent opacity on the left. Possible small right effusion scratch has small right effusion, slightly increased. No pneumothorax. No acute bony abnormality.  IMPRESSION: Slight increase in small right pleural effusion as well as right mid and lower lung atelectasis or infiltrate.  Stable cardiomegaly, vascular congestion.   Electronically Signed   By: Charlett Nose M.D.   On: 02/22/2015 07:41   US Thoracentesis Asp Pleural Space W/img Guide  02/21/2015   CLINICAL DATA:  SHORTNESS OF BREATH, LARGE RIGHT PLEURAL EFFUSION  EXAM: ULTRASOUND GUIDED RIGHT THORACENTESIS  COMPARISON:  02/17/2015  PROCEDURE: An ultrasound guided thoracentesis was thoroughly discussed with the patient and questions answered. The benefits, risks, alternatives and complications were also discussed. The patient understands and wishes to proceed with the procedure. Written consent was obtained.  Ultrasound was performed to localize and mark an adequate pocket of fluid in the right chest. The area was then prepped and draped in the normal sterile fashion. 1% Lidocaine was used for local anesthesia. Under ultrasound guidance a safety centesis needle catheter was introduced. Thoracentesis was performed. The catheter was removed and a dressing applied.  Complications:  None immediate  FINDINGS: A total of approximately 1.1 L of blood-tinged thin pleural fluid was removed. A fluid sample wassent for laboratory analysis.   IMPRESSION: Successful ultrasound guided right thoracentesis yielding 1.1 L of pleural fluid.   Electronically Signed   By: Judie Petit.  Shick M.D.   On: 02/21/2015 16:15    EKG:   Orders placed or performed during the hospital encounter of 02/16/15  . ED EKG 12-Lead  . ED EKG 12-Lead  . EKG 12-Lead  . EKG 12-Lead      Management plans discussed with the patient, family and they are in agreement.  CODE STATUS:     Code Status Orders        Start     Ordered   02/17/15 0411  Do not attempt resuscitation (DNR)   Continuous    Question Answer Comment  In the event of cardiac or respiratory ARREST Do not call a "code blue"   In the event of cardiac or respiratory ARREST Do not perform  Intubation, CPR, defibrillation or ACLS   In the event of cardiac or respiratory ARREST Use medication by any route, position, wound care, and other measures to relive pain and suffering. May use oxygen, suction and manual treatment of airway obstruction as needed for comfort.      02/17/15 0410    Advance Directive Documentation        Most Recent Value   Type of Advance Directive  Out of facility DNR (pink MOST or yellow form)   Pre-existing out of facility DNR order (yellow form or pink MOST form)  Yellow form placed in chart (order not valid for inpatient use)   "MOST" Form in Place?        TOTAL TIME TAKING CARE OF THIS PATIENT: 45 minutes.    Katharina Caper M.D on 02/22/2015 at 1:28 PM  Between 7am to 6pm - Pager - [phone]  After 6pm go to [email or web address] - password [email or web address]  [facility]  Office  [phone]  CC: Primary care physician; Pcp Not In System

## 2015-02-22 NOTE — Care Management (Signed)
Important Message  Patient Details  Name: [name] MRN: [id number] Date of Birth: 03/20/1923   Medicare Important Message Given:  Yes-third notification given    Olegario MessierKathy A Allmond 02/22/2015, 1:04 PM

## 2015-02-22 NOTE — Progress Notes (Signed)
ANTICOAGULATION CONSULT NOTE - Follow Up  Pharmacy Consult for Warfarin Indication: atrial fibrillation  Allergies  Allergen Reactions  . Codeine Phosphate     REACTION: myalgia  . Penicillins     REACTION: questionable    Patient Measurements: Height: 5\' 4"  (162.6 cm) Weight: 159 lb 1.6 oz (72.167 kg) IBW/kg (Calculated) : 54.7   Vital Signs: Temp: 97.7 F (36.5 C) (06/29 0608) Temp Source: Oral (06/29 0608) BP: 129/57 mmHg (06/29 0608) Pulse Rate: 102 (06/29 0608)  Labs:  Recent Labs  02/20/15 0438 02/21/15 0422 02/21/15 1248 02/22/15 0433  HGB  --   --   --  9.4*  HCT  --   --   --  28.5*  PLT  --   --   --  134*  LABPROT 19.0* 20.1*  --  22.4*  INR 1.57 1.69  --  1.95  CREATININE  --  0.73 0.79 0.79    Estimated Creatinine Clearance: 46.4 mL/min (by C-G formula based on Cr of 0.79).   Medical History: Past Medical History  Diagnosis Date  . Coronary artery disease     s/p CABG 1998 and s/p stents placed in the right coronary artery in 01/2008  . Atrial fibrillation   . Carotid stenosis   . Unspecified diastolic heart failure   . Hypertension   . Hyperlipidemia   . Pulmonary hypertension   . Renal artery stenosis     bilateral  . Chronic renal insufficiency   . Diverticular disease   . IBS (irritable bowel syndrome)   . Anemia   . Endometriosis   . Hypothermia   . CHF (congestive heart failure)     Medications:  Scheduled:  . acidophilus  1 capsule Oral Daily  . ALIGN  4 mg Oral Daily  . aspirin EC  81 mg Oral Daily  . docusate sodium  200 mg Oral Daily  . DULoxetine  30 mg Oral Daily  . levofloxacin  500 mg Oral Daily  . levothyroxine  50 mcg Oral Daily  . LORazepam  0.25 mg Oral BID WC  . LORazepam  0.25 mg Oral QPM  . metoprolol tartrate  25 mg Oral BID  . pravastatin  40 mg Oral Daily  . sodium chloride  3 mL Intravenous Q12H  . torsemide  10 mg Oral QODAY  . traZODone  50 mg Oral QHS  . warfarin  3 mg Oral Daily  . Warfarin -  Pharmacist Dosing Inpatient   Does not apply q1800    Assessment: 81 yo female on Warfarin for Atrial Fibrillaton. Home dose is 2.5mg  daily.  INR today of 1.96  Goal of Therapy:  INR 2-3 Monitor platelets by anticoagulation protocol: Yes   Plan:  Will continue current order of warfarin 3 mg PO q1800. PT/INR with am labs.   Clarisa Schoolsrystal Eloisa Chokshi, PharmD Clinical Pharmacist 02/22/2015

## 2015-02-23 LAB — BODY FLUID CELL COUNT WITH DIFFERENTIAL
Eos, Fluid: 0 %
Lymphs, Fluid: 40 %
Monocyte-Macrophage-Serous Fluid: 57 %
Neutrophil Count, Fluid: 3 %
Total Nucleated Cell Count, Fluid: 3 cu mm

## 2015-02-24 ENCOUNTER — Encounter
Admission: RE | Admit: 2015-02-24 | Discharge: 2015-02-24 | Disposition: A | Discharge status: Home / Self Care | Payer: Medicare Other | Source: Ambulatory Visit | Attending: Internal Medicine | Admitting: Internal Medicine

## 2015-02-24 DIAGNOSIS — I482 Chronic atrial fibrillation: Secondary | ICD-10-CM | POA: Insufficient documentation

## 2015-02-25 LAB — PH, BODY FLUID: pH, Body Fluid: 8

## 2015-02-25 LAB — BODY FLUID CULTURE: Culture: NO GROWTH

## 2015-02-27 ENCOUNTER — Encounter
Admission: RE | Admit: 2015-02-27 | Discharge: 2015-02-27 | Disposition: A | Discharge status: Home / Self Care | Payer: Medicare Other | Source: Ambulatory Visit | Attending: Internal Medicine | Admitting: Internal Medicine

## 2015-02-27 ENCOUNTER — Encounter: Payer: Self-pay | Admitting: Infectious Diseases

## 2015-02-27 DIAGNOSIS — I482 Chronic atrial fibrillation: Secondary | ICD-10-CM | POA: Insufficient documentation

## 2015-02-28 DIAGNOSIS — I482 Chronic atrial fibrillation: Secondary | ICD-10-CM | POA: Diagnosis not present

## 2015-02-28 LAB — CULTURE, BLOOD (ROUTINE X 2)
Culture: NO GROWTH
Culture: NO GROWTH

## 2015-02-28 LAB — PROTIME-INR
INR: 4.68
Prothrombin Time: 43.9 seconds — ABNORMAL HIGH (ref 11.4–15.0)

## 2015-03-02 DIAGNOSIS — I482 Chronic atrial fibrillation: Secondary | ICD-10-CM | POA: Diagnosis not present

## 2015-03-02 LAB — PROTIME-INR
INR: 3.33
Prothrombin Time: 33.8 seconds — ABNORMAL HIGH (ref 11.4–15.0)

## 2015-03-07 DIAGNOSIS — I482 Chronic atrial fibrillation: Secondary | ICD-10-CM | POA: Diagnosis not present

## 2015-03-07 LAB — PROTIME-INR
INR: 2.33
PROTHROMBIN TIME: 25.7 s — AB (ref 11.4–15.0)

## 2015-03-14 DIAGNOSIS — I482 Chronic atrial fibrillation: Secondary | ICD-10-CM | POA: Diagnosis not present

## 2015-03-14 LAB — PROTIME-INR
INR: 5.25
Prothrombin Time: 47.8 seconds — ABNORMAL HIGH (ref 11.4–15.0)

## 2015-03-15 DIAGNOSIS — I482 Chronic atrial fibrillation: Secondary | ICD-10-CM | POA: Diagnosis not present

## 2015-03-15 LAB — PROTIME-INR
INR: 3.57
PROTHROMBIN TIME: 35.7 s — AB (ref 11.4–15.0)

## 2015-03-16 DIAGNOSIS — I482 Chronic atrial fibrillation: Secondary | ICD-10-CM | POA: Diagnosis not present

## 2015-03-16 LAB — PROTIME-INR
INR: 2.67
PROTHROMBIN TIME: 28.5 s — AB (ref 11.4–15.0)

## 2015-03-17 LAB — URINE CULTURE: Culture: 60000

## 2015-03-21 DIAGNOSIS — I482 Chronic atrial fibrillation: Secondary | ICD-10-CM | POA: Diagnosis not present

## 2015-03-21 LAB — PROTIME-INR
INR: 2.31
PROTHROMBIN TIME: 25.5 s — AB (ref 11.4–15.0)

## 2015-03-27 ENCOUNTER — Encounter
Admission: RE | Admit: 2015-03-27 | Discharge: 2015-03-27 | Disposition: A | Discharge status: Home / Self Care | Payer: Medicare Other | Source: Ambulatory Visit | Attending: Internal Medicine | Admitting: Internal Medicine

## 2015-03-27 DIAGNOSIS — R350 Frequency of micturition: Secondary | ICD-10-CM | POA: Insufficient documentation

## 2015-03-27 DIAGNOSIS — I482 Chronic atrial fibrillation: Secondary | ICD-10-CM | POA: Insufficient documentation

## 2015-03-28 DIAGNOSIS — I482 Chronic atrial fibrillation: Secondary | ICD-10-CM | POA: Diagnosis present

## 2015-03-28 DIAGNOSIS — R350 Frequency of micturition: Secondary | ICD-10-CM | POA: Diagnosis present

## 2015-03-28 LAB — PROTIME-INR
INR: 3.06
PROTHROMBIN TIME: 31.7 s — AB (ref 11.4–15.0)

## 2015-04-06 DIAGNOSIS — I482 Chronic atrial fibrillation: Secondary | ICD-10-CM | POA: Diagnosis not present

## 2015-04-06 LAB — URINALYSIS COMPLETE WITH MICROSCOPIC (ARMC ONLY)
BILIRUBIN URINE: NEGATIVE
Bacteria, UA: NONE SEEN
Glucose, UA: NEGATIVE mg/dL
HGB URINE DIPSTICK: NEGATIVE
KETONES UR: NEGATIVE mg/dL
Leukocytes, UA: NEGATIVE
Nitrite: NEGATIVE
PH: 5 (ref 5.0–8.0)
Protein, ur: NEGATIVE mg/dL
RBC / HPF: NONE SEEN RBC/hpf (ref 0–5)
SPECIFIC GRAVITY, URINE: 1.014 (ref 1.005–1.030)

## 2015-04-06 LAB — PROTIME-INR
INR: 4.37
Prothrombin Time: 41.7 seconds — ABNORMAL HIGH (ref 11.4–15.0)

## 2015-04-07 DIAGNOSIS — I482 Chronic atrial fibrillation: Secondary | ICD-10-CM | POA: Diagnosis not present

## 2015-04-07 LAB — PROTIME-INR
INR: 4.61
Prothrombin Time: 43.4 seconds — ABNORMAL HIGH (ref 11.4–15.0)

## 2015-04-08 ENCOUNTER — Other Ambulatory Visit
Admission: RE | Admit: 2015-04-08 | Discharge: 2015-04-08 | Disposition: A | Discharge status: Home / Self Care | Payer: Medicare Other | Source: Ambulatory Visit | Attending: Gerontology | Admitting: Gerontology

## 2015-04-08 DIAGNOSIS — I4891 Unspecified atrial fibrillation: Secondary | ICD-10-CM | POA: Insufficient documentation

## 2015-04-08 LAB — PROTIME-INR
INR: 4.53
Prothrombin Time: 42.8 seconds — ABNORMAL HIGH (ref 11.4–15.0)

## 2015-04-08 LAB — URINE CULTURE: Culture: NO GROWTH

## 2015-04-24 ENCOUNTER — Ambulatory Visit: Payer: Medicare Other | Admitting: Cardiovascular Disease

## 2015-04-27 DEATH — deceased

## 2017-02-11 IMAGING — CT CT ABD-PELV W/ CM
1 of 3 series · 13 of 32 positions shown, 17 images · IV contrast (omnipaque)
Comparison: Portable chest 02/16/2015. CTs of the chest and abdomen
09/28/2011.

CLINICAL DATA: 89-year-old with Enterococcus septicemia, likely GU
source. Abdominal pain. History of dementia, chronic kidney disease,
atrial ablation and irritable bowel syndrome. Initial encounter.

EXAM:
CT ABDOMEN AND PELVIS WITH CONTRAST
TECHNIQUE: Multidetector CT imaging of the abdomen and pelvis was performed
using the standard protocol following bolus administration of
intravenous contrast.
CONTRAST:  100mL OMNIPAQUE IOHEXOL 300 MG/ML  SOLN

[Series 2: routine abd pel with · axial · 0.74mm/px · z∈[-408,-3]mm · 13 of 91 slices shown, 17 images]
[im 5/91  soft-tissue]
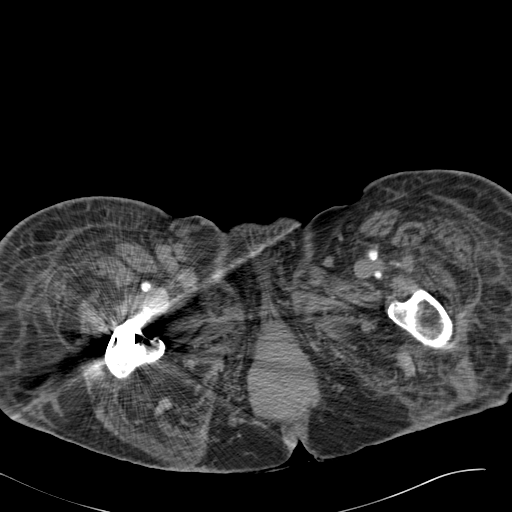
[im 5/91  bone]
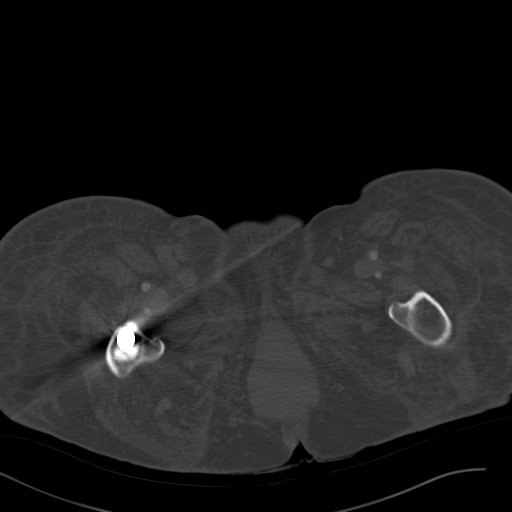
[im 15/91  soft-tissue]
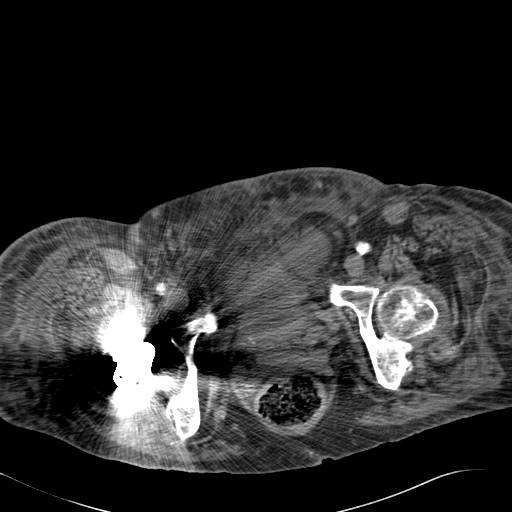
[im 24/91  soft-tissue]
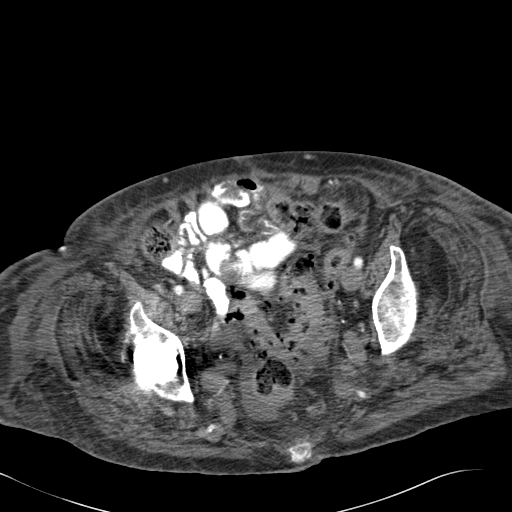
[im 29/91  soft-tissue]
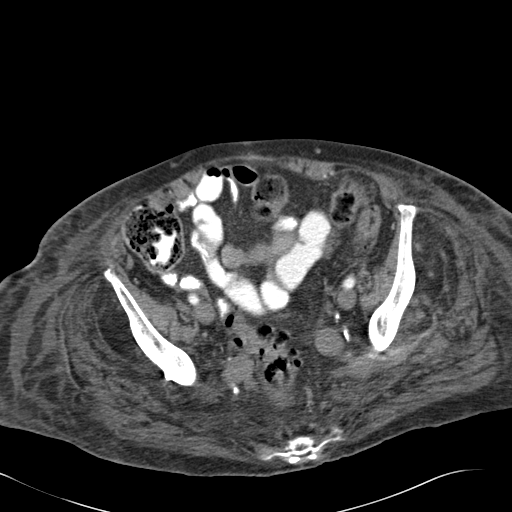
[im 38/91  soft-tissue]
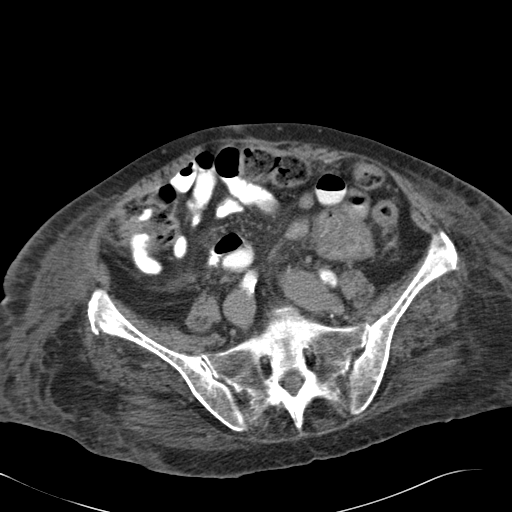
[im 48/91  soft-tissue]
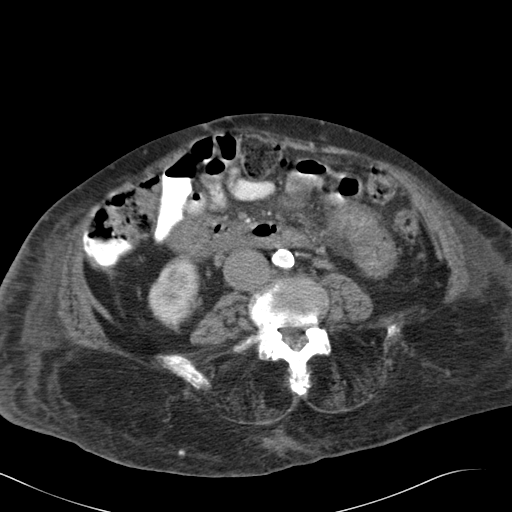
[im 53/91  soft-tissue]
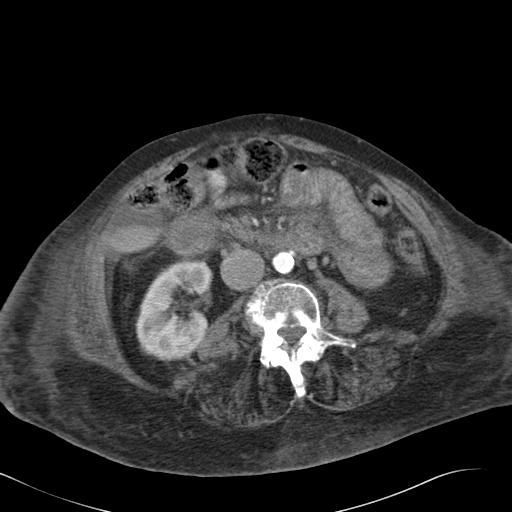
[im 62/91  soft-tissue]
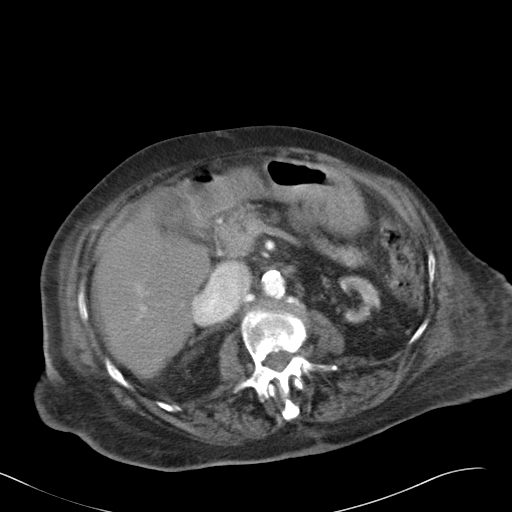
[im 67/91  soft-tissue]
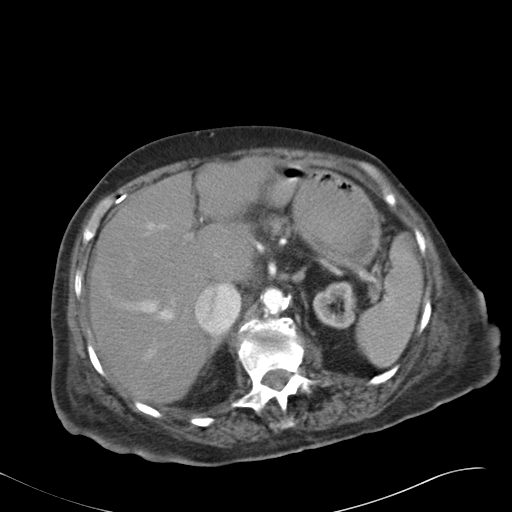
[im 67/91  bone]
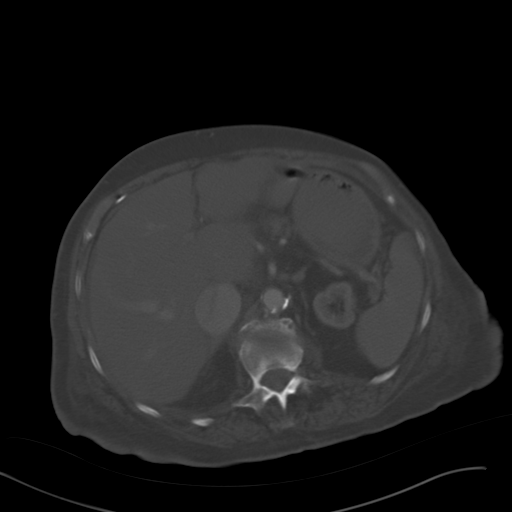
[im 72/91  lung]
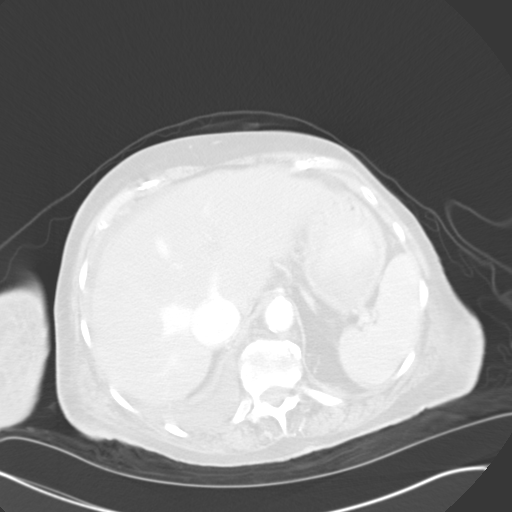
[im 76/91  soft-tissue]
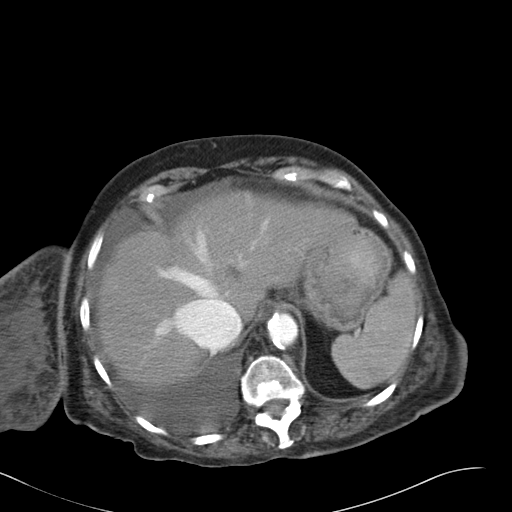
[im 76/91  lung]
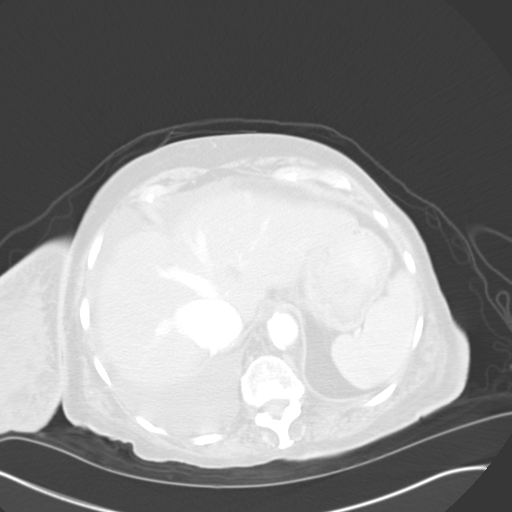
[im 81/91  lung]
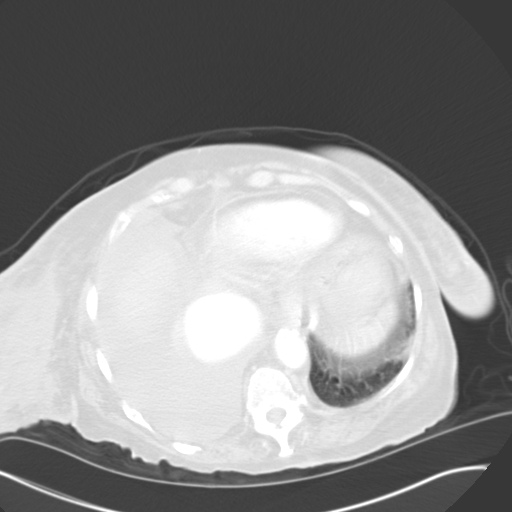
[im 86/91  soft-tissue]
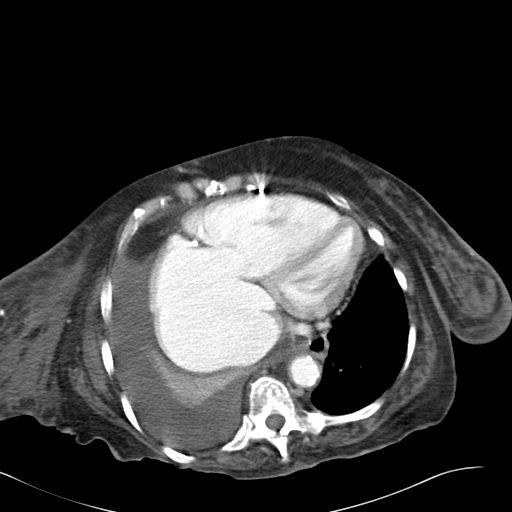
[im 86/91  lung]
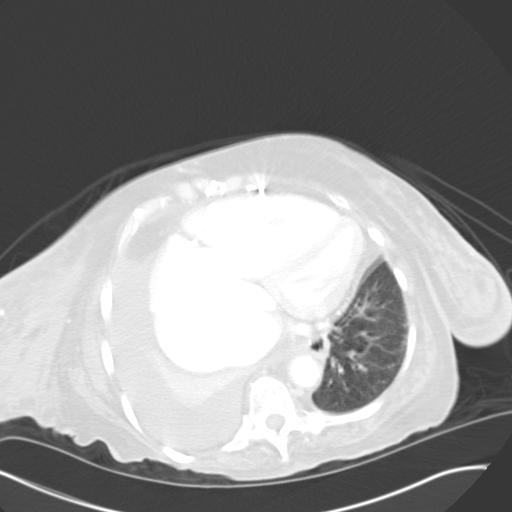

[13 of 32 positions shown; findings below may reference images not displayed]

FINDINGS: Lower chest: There is marked cardiac enlargement status post median
sternotomy. There is a moderate size right pleural effusion which is
incompletely visualized. The visualized right lower lobe is
completely collapsed. The left lung base is clear. There is no
significant left pleural or pericardial effusion.

Hepatobiliary: No focal hepatic abnormalities identified. The IVC
and hepatic veins are distended with reflux of contrast suggesting
right heart failure. There is dependent sludge or small gallstones
within the gallbladder lumen. There is possible mild gallbladder
wall thickening, nonspecific. No significant biliary dilatation.

Pancreas: Stable appearance. Previously demonstrated pancreas
divisum not well seen. No pancreatic ductal dilatation or
surrounding inflammatory changes.

Spleen: Normal in size without focal abnormality.

Adrenals/Urinary Tract: Both adrenal glands appear normal.The left
kidney demonstrates chronic atrophy and cortical thinning. The right
kidney is normal in size with mild cortical scarring in the upper
pole. There are small wedge-shaped areas of decreased attenuation in
the upper and lower poles which could reflect infarcts or
pyelonephritis. There is no perinephric soft tissue stranding or
hydronephrosis. The bladder is not optimally evaluated due to
incomplete distension and artifact from the patient's right total
hip arthroplasty.

Stomach/Bowel: No evidence of bowel wall thickening, distention or
surrounding inflammatory change.There are fairly extensive
diverticular changes throughout the distal colon without focal
surrounding inflammation. A small amount of free pelvic fluid is
present.

Vascular/Lymphatic: There are no enlarged abdominal or pelvic lymph
nodes. There is diffuse atherosclerosis of the aorta, its branches
and iliac arteries. The IVC and iliac veins are distended.

Reproductive: Previous hysterectomy.  No evidence of adnexal mass.

Other: Generalized anasarca with diffuse edema throughout the
subcutaneous and mesenteric fat.

Musculoskeletal: No acute or significant osseous findings. Chronic
superior endplate compression deformity at L3, lumbar degenerative
changes and previous right total hip arthroplasty noted.
IMPRESSION: 1. Anasarca with generalized soft tissue edema, mild ascites and at
least moderate size right pleural effusion. Findings are likely
secondary to right heart failure given the patient's cardiomegaly,
distension of the hepatic veins and IVC and reflux of contrast into
the liver.
2. No obvious worse for infection identified within the abdomen,
although mild right-sided pyelonephritis difficult to exclude.
Chronic left renal atrophy appears unchanged. No hydronephrosis.
3. Small gallstones versus sludge in the gallbladder lumen with mild
gallbladder wall thickening, nonspecific given the anasarca.
4. Right lower lobe collapse may be secondary to the pleural
effusion. Chest radiographic follow up recommended. Thoracentesis
should be considered.
5. Distal colonic diverticulosis.
6. Diffuse atherosclerosis.

## 2017-02-13 IMAGING — CR DG CHEST 1V PORT
1 series · 1 of 1 positions shown · non-contrast
Comparison: 02/21/2015

CLINICAL DATA: Post thoracentesis

EXAM:
PORTABLE CHEST - 1 VIEW

[portable]
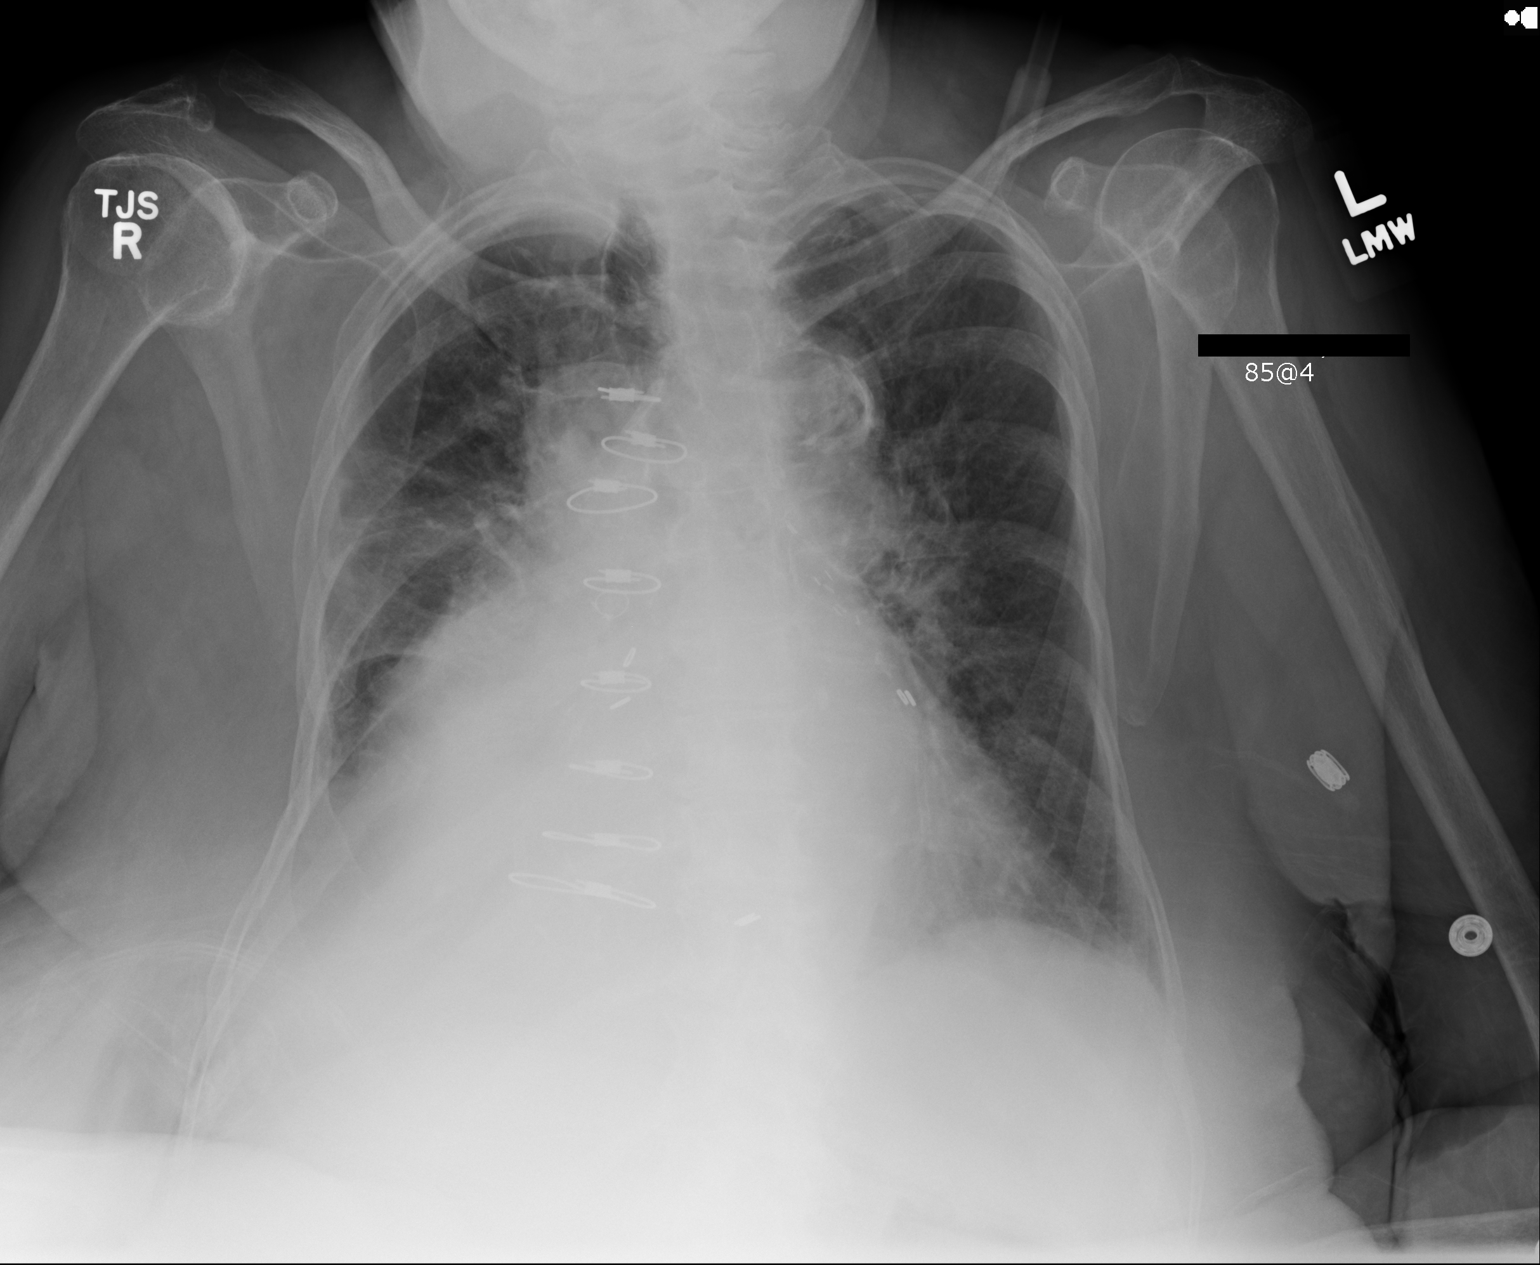

[1 of 1 positions shown; findings below may reference images not displayed]

FINDINGS: Prior CABG. There is cardiomegaly. Vascular congestion is stable.
Right mid and lower lung airspace opacity has increased. No
confluent opacity on the left. Possible small right effusion scratch
has small right effusion, slightly increased. No pneumothorax. No
acute bony abnormality.
IMPRESSION: Slight increase in small right pleural effusion as well as right mid
and lower lung atelectasis or infiltrate.

Stable cardiomegaly, vascular congestion.
